# Patient Record
Sex: Female | Born: 1935 | Race: White | Hispanic: No | Smoking: Never smoker
Health system: Southern US, Community
[De-identification: ages and names within clinical notes are randomized; demographics above are authoritative.]

## PROBLEM LIST (undated history)

## (undated) DIAGNOSIS — I10 Essential (primary) hypertension: Secondary | ICD-10-CM

## (undated) DIAGNOSIS — I493 Ventricular premature depolarization: Secondary | ICD-10-CM

## (undated) DIAGNOSIS — E785 Hyperlipidemia, unspecified: Secondary | ICD-10-CM

## (undated) DIAGNOSIS — H9190 Unspecified hearing loss, unspecified ear: Secondary | ICD-10-CM

## (undated) DIAGNOSIS — R002 Palpitations: Secondary | ICD-10-CM

## (undated) DIAGNOSIS — R42 Dizziness and giddiness: Secondary | ICD-10-CM

## (undated) DIAGNOSIS — I351 Nonrheumatic aortic (valve) insufficiency: Secondary | ICD-10-CM

## (undated) DIAGNOSIS — I2510 Atherosclerotic heart disease of native coronary artery without angina pectoris: Secondary | ICD-10-CM

## (undated) DIAGNOSIS — R0602 Shortness of breath: Secondary | ICD-10-CM

## (undated) DIAGNOSIS — M4800 Spinal stenosis, site unspecified: Secondary | ICD-10-CM

## (undated) HISTORY — PX: INGUINAL HERNIA REPAIR: SUR1180

## (undated) HISTORY — PX: ABDOMINAL HYSTERECTOMY: SHX81

## (undated) HISTORY — DX: Atherosclerotic heart disease of native coronary artery without angina pectoris: I25.10

## (undated) HISTORY — DX: Hyperlipidemia, unspecified: E78.5

## (undated) HISTORY — DX: Dizziness and giddiness: R42

## (undated) HISTORY — DX: Ventricular premature depolarization: I49.3

## (undated) HISTORY — DX: Nonrheumatic aortic (valve) insufficiency: I35.1

## (undated) HISTORY — DX: Essential (primary) hypertension: I10

## (undated) HISTORY — PX: TONSILLECTOMY AND ADENOIDECTOMY: SUR1326

## (undated) HISTORY — PX: APPENDECTOMY: SHX54

## (undated) HISTORY — DX: Palpitations: R00.2

## (undated) HISTORY — PX: OVARIAN CYST REMOVAL: SHX89

## (undated) HISTORY — DX: Shortness of breath: R06.02

---

## 1991-07-29 HISTORY — PX: CARDIAC CATHETERIZATION: SHX172

## 1995-07-29 HISTORY — PX: CORONARY ARTERY BYPASS GRAFT: SHX141

## 1996-01-12 HISTORY — PX: CARDIAC CATHETERIZATION: SHX172

## 1998-12-03 ENCOUNTER — Other Ambulatory Visit: Admission: RE | Admit: 1998-12-03 | Discharge: 1998-12-03 | Payer: Self-pay | Admitting: Obstetrics and Gynecology

## 1998-12-26 ENCOUNTER — Ambulatory Visit (HOSPITAL_COMMUNITY): Admission: RE | Admit: 1998-12-26 | Discharge: 1998-12-26 | Payer: Self-pay | Admitting: Geriatric Medicine

## 1999-09-16 ENCOUNTER — Emergency Department (HOSPITAL_COMMUNITY): Admission: EM | Admit: 1999-09-16 | Discharge: 1999-09-16 | Payer: Self-pay

## 1999-12-27 ENCOUNTER — Other Ambulatory Visit: Admission: RE | Admit: 1999-12-27 | Discharge: 1999-12-27 | Payer: Self-pay | Admitting: Obstetrics and Gynecology

## 2000-01-10 ENCOUNTER — Encounter: Admission: RE | Admit: 2000-01-10 | Discharge: 2000-01-10 | Payer: Self-pay | Admitting: Obstetrics and Gynecology

## 2000-01-10 ENCOUNTER — Encounter: Payer: Self-pay | Admitting: Obstetrics and Gynecology

## 2000-04-21 ENCOUNTER — Encounter: Admission: RE | Admit: 2000-04-21 | Discharge: 2000-04-21 | Payer: Self-pay | Admitting: *Deleted

## 2000-04-21 ENCOUNTER — Encounter: Payer: Self-pay | Admitting: Cardiology

## 2000-12-29 ENCOUNTER — Other Ambulatory Visit: Admission: RE | Admit: 2000-12-29 | Discharge: 2000-12-29 | Payer: Self-pay | Admitting: Obstetrics and Gynecology

## 2001-01-12 ENCOUNTER — Encounter: Payer: Self-pay | Admitting: Obstetrics and Gynecology

## 2001-01-12 ENCOUNTER — Encounter: Admission: RE | Admit: 2001-01-12 | Discharge: 2001-01-12 | Payer: Self-pay | Admitting: Obstetrics and Gynecology

## 2002-01-17 ENCOUNTER — Encounter: Admission: RE | Admit: 2002-01-17 | Discharge: 2002-01-17 | Payer: Self-pay | Admitting: Geriatric Medicine

## 2002-01-17 ENCOUNTER — Encounter: Payer: Self-pay | Admitting: Geriatric Medicine

## 2002-06-26 ENCOUNTER — Encounter: Payer: Self-pay | Admitting: Geriatric Medicine

## 2002-06-26 ENCOUNTER — Encounter: Admission: RE | Admit: 2002-06-26 | Discharge: 2002-06-26 | Payer: Self-pay | Admitting: Geriatric Medicine

## 2003-01-24 ENCOUNTER — Encounter: Payer: Self-pay | Admitting: Geriatric Medicine

## 2003-01-24 ENCOUNTER — Encounter: Admission: RE | Admit: 2003-01-24 | Discharge: 2003-01-24 | Payer: Self-pay | Admitting: Geriatric Medicine

## 2004-10-29 ENCOUNTER — Encounter: Admission: RE | Admit: 2004-10-29 | Discharge: 2004-10-29 | Payer: Self-pay | Admitting: Geriatric Medicine

## 2005-01-31 ENCOUNTER — Ambulatory Visit: Payer: Self-pay | Admitting: Physical Medicine & Rehabilitation

## 2005-01-31 ENCOUNTER — Inpatient Hospital Stay (HOSPITAL_COMMUNITY): Admission: AD | Admit: 2005-01-31 | Discharge: 2005-02-06 | Payer: Self-pay | Admitting: *Deleted

## 2005-09-27 ENCOUNTER — Emergency Department (HOSPITAL_COMMUNITY): Admission: EM | Admit: 2005-09-27 | Discharge: 2005-09-27 | Payer: Self-pay | Admitting: Emergency Medicine

## 2005-10-08 ENCOUNTER — Encounter: Admission: RE | Admit: 2005-10-08 | Discharge: 2005-11-19 | Payer: Self-pay | Admitting: Orthopedic Surgery

## 2005-11-20 ENCOUNTER — Encounter: Admission: RE | Admit: 2005-11-20 | Discharge: 2005-12-10 | Payer: Self-pay | Admitting: Orthopedic Surgery

## 2005-12-11 ENCOUNTER — Encounter: Admission: RE | Admit: 2005-12-11 | Discharge: 2006-03-11 | Payer: Self-pay | Admitting: Orthopedic Surgery

## 2005-12-29 ENCOUNTER — Encounter: Admission: RE | Admit: 2005-12-29 | Discharge: 2005-12-29 | Payer: Self-pay | Admitting: Geriatric Medicine

## 2006-12-31 ENCOUNTER — Encounter: Admission: RE | Admit: 2006-12-31 | Discharge: 2006-12-31 | Payer: Self-pay | Admitting: Geriatric Medicine

## 2008-01-03 ENCOUNTER — Encounter: Admission: RE | Admit: 2008-01-03 | Discharge: 2008-01-03 | Payer: Self-pay | Admitting: Cardiology

## 2008-10-09 ENCOUNTER — Encounter: Admission: RE | Admit: 2008-10-09 | Discharge: 2008-11-22 | Payer: Self-pay | Admitting: Orthopedic Surgery

## 2010-12-13 NOTE — Discharge Summary (Signed)
[name], [name]                 ACCOUNT NO.:  [id number]   MEDICAL RECORD NO.:  [id number]          PATIENT TYPE:  INP   LOCATION:  3003                         FACILITY:  [facility]   PHYSICIAN:  Jordan [name], M.D.DATE OF BIRTH:  Jan 02, 1916   DATE OF ADMISSION:  01/31/2005  DATE OF DISCHARGE:  02/06/2005                                 DISCHARGE SUMMARY   ADDENDUM:  Patient discharged today.  She was seen in rounds today.  She is doing fine,  no complaints.  She has been ambulating well with a rolling walker.  Her  discharge BP was 144/55.  Patient's antihypertensive medications were held  on account of low blood pressures.  Decided to resume patient on all her pre  admission medications.  Again, to resume all her pre admission medications  except for Norvasc which will be on hold for now until she sees her PCP.  Also, instead of taking Darvon/HCTZ 150/12.5 half tablet b.i.d. she is going  to take half tablet once daily only for now until she sees her PCP.  She  will be getting Darvocet-N 100 one to two tablets q.4h. p.r.n. for pain.  She is to follow up with her PCP, Dr. Pete [name] next week for evaluation.  She is going to be discharged home with home health PT/OT and aide.  Patient  discharged stable, satisfactory condition.  We had planned to send her to  [facility] but evaluation by rehabilitation medicine showed appropriate for home  discharge with therapy.       GO/MEDQ  D:  02/06/2005  T:  02/06/2005  Job:  [id number]

## 2010-12-13 NOTE — H&P (Signed)
NAME:  Oakey, Dari                 ACCOUNT NO.:  [id number]   MEDICAL RECORD NO.:  [id number]          PATIENT TYPE:  INP   LOCATION:  3003                         FACILITY:  [facility]   PHYSICIAN:  Hal T. Stoneking, M.D. DATE OF BIRTH:  12-02-1915   DATE OF ADMISSION:  01/31/2005  DATE OF DISCHARGE:                                HISTORY & PHYSICAL   IDENTIFYING DATA:  Ms. Scioneaux is a very nice 77 year old white female.  She is  independent in all of her activities of daily living.   PROBLEM LIST:  Syndrome.  Ms. Redwine felt poorly about a week ago with what  she felt was a viral gastroenteritis with a lot of nausea and vomiting.  It  seemed to be a self limited problem.  She then awakened, on the morning of  January 29, 2005, with a very painful neck if she would move her neck from side  to side.  She has known cervical spine disease.  In fact, she had a MRI of  her cervical spine which was done back in November 2003.  It showed  degenerative changes in the mid cervical region at C3-C4, C4-C5, C5-C6 with  spondylosis.  No cord deformity.  She had facet degeneration in that same  region showing osteophytic encroachment on the neuroforamen.  When she was  seen in the office two days ago, she was placed on Skelaxin 800 mg at night.  She took that the evening of January 29, 2005, and then on January 30, 2005, she  said she just was sleepy and slept most of the day, and then today she  awakened and had severe low back pain.  She stated that her neck felt much  better.  She was not aware of any fever, no cough.  She states she moved her  bowels normally yesterday but has not moved them today and noticed her  abdomen to be a little bit more protuberant.  She states the back pain is  quite severe and it moves down into both legs.  It has made it very  difficult for her to get up and down and she has required the help of a  friend to do so.  She does have a history of moderate aortic insufficiency.  This has  been  followed by Dr. Peter [place].  She has really noted no cough, no shortness  of breath, and no ankle edema.   PAST MEDICAL HISTORY:  She has been intolerant to multiple medications  including:  Keflex, Equagesic, Cafergot, tetracycline, codeine, Clinoril,  prednisone, Lodine, Dolobid, erythromycin, Vioxx, and Zetia.  Most of these  were intolerances, I am not aware of any true allergic reaction.   CURRENT MEDICATIONS:  1.  Fosamax 70 mg once a week.  2.  Calcium with vitamin D twice a day.  3.  Crestor 10 mg a day.  4.  Norvasc 5 mg a day.  5.  Diovan 160/12.5, half tablet twice a day.  6.  Protonix 40 mg a day.  7.  Premarin 0.625 mg a day.  8.  Aspirin one a day.  9.  __________  two p.o. b.i.d.  10. Refresh eye drops two drops each eye twice a day.   PAST MEDICAL HISTORY:  1.  Osteoporosis.  2.  Elevated cholesterol.  3.  Coronary artery disease.  4.  Hypertension, diagnosed in 1982.  5.  Presbycusis with bilateral hearing aids.  6.  Aortic insufficiency.  7.  Hiatal hernia seen on upper GI in 1995.   PREVIOUS SURGERIES:  1.  She had a four-vessel CABG.  2.  She has had a total abdominal hysterectomy for abnormal uterine      bleeding.  3.  Ovaries removed in 1991.  4.  She has had an appendectomy.   FAMILY HISTORY:  Mother died in her 29s of an MI.  Son had an MI at the age  of 31 and a CABG, died of a ruptured aneurysm at the age of 62.  Father had  renal disease, died from heart disease.  One sister died, unclear cause.  One sister, age 84, is in a nursing home.  One brother died of an MI and  cirrhosis.  One brother died after a motor vehicle accident.   SOCIAL HISTORY:  Mrs. Achee is divorced.  Lost her son, in 2001, to abdominal  aortic aneurysm.  She is a retired Public librarian.  Does not smoke or  drink.   REVIEW OF SYSTEMS:  No headache, no change in her vision, no trouble  swallowing, no dysuria, otherwise please see HPI.   PHYSICAL EXAMINATION:   VITAL SIGNS:  Temperature 101.3, pulse 96, blood  pressure 124/70, respiratory rate 20.  SKIN:  Normal.  No evidence of rash.  HEENT:  Pupils are equal, round, and reactive to light.  Fundi normal.  TMs  normal.  LUNGS:  Revealed some decreased breath sounds at the bases.  HEART:  Regular rate and rhythm with a 1/6 early diastolic murmur.  ABDOMEN:  Somewhat protuberant.  Bowel sounds present.  Mild tenderness  throughout.  EXTREMITIES:  No lower extremity edema.   ASSESSMENT:  1.  Fever, very possibly could have a urinary tract infection but I doubt      that pyelonephritis is the cause of her back pain.  It seems much more      mechanical.  She could also have a diskitis explaining her fever.  2.  Back pain.  May very well be exacerbation of disk disease with her      recent nausea and vomiting.  However, can not rule out diskitis or even      a ruptured disk.  Also a possibility would be vertebral compression      fracture.  3.  Aortic insufficiency.  She is hemodynamically stable.  No acute change      is noted.  4.  Abdominal distention, mild tenderness, maybe an ileus.  We will get a      KUB to evaluate further.   PLAN:  1.  We will obtain a CBC, C-MET, UA, and blood cultures.  2.  We will obtain a KUB.  3.  We will obtain L, S spine films.  She may need an MRI of her L, S spine      if the above workup is unrevealing.       HTS/MEDQ  D:  01/31/2005  T:  01/31/2005  Job:  [id number]

## 2010-12-13 NOTE — Discharge Summary (Signed)
NAMESWATI, GRANBERRY                 ACCOUNT NO.:  [id number]   MEDICAL RECORD NO.:  [id number]          PATIENT TYPE:  INP   LOCATION:  3003                         FACILITY:  [facility]   PHYSICIAN:  Theone Stanley, MD   DATE OF BIRTH:  Jul 15, 1916   DATE OF ADMISSION:  01/31/2005  DATE OF DISCHARGE:                                 DISCHARGE SUMMARY   ADMISSION DIAGNOSES:  1.  Severe back pain.  2.  Osteoporosis.  3.  Hypertension.  4.  Hyperlipidemia.  5.  Gastroesophageal reflux disease.  6.  History of coronary artery disease.  7.  Aortic insufficiency.  8.  Hiatal hernia.   DISCHARGE DIAGNOSES:  1.  Back pain, most likely musculoskeletal.  2.  Osteoporosis.  3.  Hypertension.  4.  Hyperlipidemia.  5.  Gastroesophageal reflux disease.  6.  History of coronary artery disease.  7.  Aortic insufficiency.  8.  Hiatal hernia.   CONSULTATIONS:  None.   PROCEDURES, DIAGNOSTIC TESTS:  On July 10, the patient had an MRI of the  spine, which showed no acute abnormalities.  There is no fracture or mass.  There is no disk protrusion or significant spinal stenosis.   HOSPITAL COURSE:  Ms. Lortz is a very nice 77 year old female who presented  to Dr. Pete Glatter with severe back pain.  She was given Skelaxin at that  time, which caused her to become very sleepy and slept most of the day, and  she had awakened with severe lower back pain.  At that time the back pain  was quite severe and it moved down both her legs and felt she had difficulty  getting up.  She has a history of moderate aortic insufficiency and is  followed by Dr. [place].  Because of this intractable back pain, the patient  was admitted.  X-rays were performed at that time, which were negative.  However, because of the continued severe back pain, a question of a  compression fracture especially with her history of osteoporosis, an MRI was  performed.  This was negative.  The only other issue that she has had is low-  grade  fever, of which at this point in time there is no obvious source for  this patient.  The patient's back pain slowly improved.  In regard to her hypertension and  her other medical issues, she was continued on her home medications.  Please  see next discharge summary for discharge medications, further workup and  follow-up.       AEJ/MEDQ  D:  02/03/2005  T:  02/04/2005  Job:  [id number]   cc:   Hal T. Stoneking, M.D.  [address] [place], [place] [address]  Fax: [phone]

## 2011-02-03 ENCOUNTER — Encounter: Payer: Self-pay | Admitting: Cardiology

## 2011-02-06 ENCOUNTER — Encounter: Payer: Self-pay | Admitting: Cardiology

## 2011-02-07 ENCOUNTER — Ambulatory Visit
Admission: RE | Admit: 2011-02-07 | Discharge: 2011-02-07 | Disposition: A | Discharge status: Home / Self Care | Payer: Medicare Other | Source: Ambulatory Visit | Attending: Cardiology | Admitting: Cardiology

## 2011-02-07 ENCOUNTER — Ambulatory Visit (INDEPENDENT_AMBULATORY_CARE_PROVIDER_SITE_OTHER): Payer: Medicare Other | Admitting: Cardiology

## 2011-02-07 ENCOUNTER — Encounter: Payer: Self-pay | Admitting: Cardiology

## 2011-02-07 VITALS — BP 160/72 | HR 76 | Ht 60.0 in | Wt 123.0 lb

## 2011-02-07 DIAGNOSIS — R079 Chest pain, unspecified: Secondary | ICD-10-CM

## 2011-02-07 DIAGNOSIS — R0609 Other forms of dyspnea: Secondary | ICD-10-CM

## 2011-02-07 DIAGNOSIS — R0600 Dyspnea, unspecified: Secondary | ICD-10-CM

## 2011-02-07 DIAGNOSIS — R0989 Other specified symptoms and signs involving the circulatory and respiratory systems: Secondary | ICD-10-CM

## 2011-02-07 DIAGNOSIS — I2510 Atherosclerotic heart disease of native coronary artery without angina pectoris: Secondary | ICD-10-CM

## 2011-02-07 DIAGNOSIS — R0602 Shortness of breath: Secondary | ICD-10-CM

## 2011-02-07 DIAGNOSIS — I351 Nonrheumatic aortic (valve) insufficiency: Secondary | ICD-10-CM

## 2011-02-07 DIAGNOSIS — I10 Essential (primary) hypertension: Secondary | ICD-10-CM

## 2011-02-07 DIAGNOSIS — E785 Hyperlipidemia, unspecified: Secondary | ICD-10-CM | POA: Insufficient documentation

## 2011-02-07 LAB — CBC WITH DIFFERENTIAL/PLATELET
Basophils Relative: 0.3 % (ref 0.0–3.0)
Eosinophils Absolute: 0.3 10*3/uL (ref 0.0–0.7)
Eosinophils Relative: 3.1 % (ref 0.0–5.0)
HCT: 41.7 % (ref 36.0–46.0)
MCHC: 34.4 g/dL (ref 30.0–36.0)
MCV: 93 fl (ref 78.0–100.0)
Monocytes Absolute: 0.7 10*3/uL (ref 0.1–1.0)
Monocytes Relative: 7.7 % (ref 3.0–12.0)
Neutro Abs: 5.4 10*3/uL (ref 1.4–7.7)
WBC: 9.6 10*3/uL (ref 4.5–10.5)

## 2011-02-07 LAB — BRAIN NATRIURETIC PEPTIDE: Pro B Natriuretic peptide (BNP): 168 pg/mL — ABNORMAL HIGH (ref 0.0–100.0)

## 2011-02-07 MED ORDER — ISOSORBIDE MONONITRATE ER 60 MG PO TB24: 60.0000 mg | ORAL_TABLET | ORAL | Status: DC

## 2011-02-07 NOTE — Assessment & Plan Note (Signed)
Based on her evaluation today it doesn't appear that she has significant volume overload or congestive heart failure. We will obtain an echocardiogram. I suspect that her symptoms may be anginal equivalent symptoms and I recommended adding isosorbide 60 mg daily to her current regimen. At her age she is not a candidate for aggressive evaluation.

## 2011-02-07 NOTE — Progress Notes (Signed)
Cyndie Chime Date of Birth: [birth year]   History of Present Illness: Mrs. Jordan [name] is seen today for evaluation of shortness of breath. She is now 77 years old. I have not seen her in a couple of years. She reports that over the past 2 weeks she has experienced increasing shortness of breath. This is worse with activity. On 2 occasions she has had chest pain as well for which she has taken nitroglycerin. She does feel that the nitroglycerin helped. She has experienced mild swelling in her left ankle. At first she thought this was related to the cholesterol medicine she was prescribed.This as WelChol which she takes twice a day. She has a known history of coronary disease and is status post CABG in 1997. She also has a history of mild to moderate aortic insufficiency.  Current Outpatient Prescriptions on File Prior to Visit  Medication Sig Dispense Refill  . amLODipine (NORVASC) 5 MG tablet Take 5 mg by mouth daily.        Marland Kitchen aspirin 81 MG tablet Take 81 mg by mouth daily.        . Calcium Carbonate-Vit D-Min (CALTRATE 600+D PLUS) 600-400 MG-UNIT per tablet Take 1 tablet by mouth daily.        . colesevelam (WELCHOL) 625 MG tablet Take 625 mg by mouth 2 (two) times daily with a meal.        . Estradiol (VIVELLE-DOT TD) Place onto the skin daily.        . fish oil-omega-3 fatty acids 1000 MG capsule Take 1 g by mouth daily.        . Multiple Vitamin (MULTIVITAMIN) tablet Take 1 tablet by mouth daily.        . Multiple Vitamins-Minerals (ICAPS PO) Take by mouth daily.        . nitroGLYCERIN (NITROSTAT) 0.4 MG SL tablet Place 0.4 mg under the tongue every 5 (five) minutes as needed.        Marland Kitchen omeprazole (PRILOSEC) 20 MG capsule Take 20 mg by mouth daily.        . valsartan (DIOVAN) 160 MG tablet Take 160 mg by mouth daily.        . vitamin C (ASCORBIC ACID) 500 MG tablet Take 500 mg by mouth daily.          Allergies  Allergen Reactions  . Cafergot   . Clinoril (Sulindac)   . Codeine   .  Dolobid (Diflunisal)   . Equagesic   . Flu Virus Vaccine   . Keflex   . Neosporin (Neomycin-Polymyx-Gramicid)   . Other     BEE STING  . Prednisone   . Tetracyclines & Related     Past Medical History  Diagnosis Date  . Chest pain   . SOB (shortness of breath)   . Coronary artery disease   . Hyperlipidemia   . Hypertension   . Aortic insufficiency     MILD TO MODERATE  . PVC's (premature ventricular contractions)   . Dizziness   . Palpitations     Past Surgical History  Procedure Date  . Cardiac catheterization 01/12/1996    THE EF IS DIFFICULT TO ESTIMATE DUE TO PVCS. SEVERE TWO-VESSEL OBSTRUCTIVE ATHERSCLEROTIC CAD WITH COMPLEX STENOSIS IN THE LAD AND DIAGONAL VESSELS  . Cardiac catheterization 07/29/1991    EF 80-85%  . Coronary artery bypass graft 1997  . Tonsillectomy and adenoidectomy   . Appendectomy   . Ovarian cyst removal   . Inguinal hernia repair  History  Smoking status  . Never Smoker   Smokeless tobacco  . Never Used    History  Alcohol Use No    Family History  Problem Relation Age of Onset  . Heart disease Mother   . Heart disease Father   . Nephritis Brother     Review of Systems: The review of systems is positive for mild swelling in her left ankle. She has chronic hearing loss. She does report increased frequency of urination. All other systems were reviewed and are negative.  Physical Exam: BP 160/72  Pulse 76  Ht 5' (1.524 m)  Wt 123 lb (55.792 kg)  BMI 24.02 kg/m2 She is an elderly white female in no acute distress. She is normocephalic, atraumatic. Pupils are round and reactive to light and accommodation. Extraocular movements are full. Oropharynx is clear. Neck is supple without JVD, adenopathy, or bruits. Lungs are clear. Cardiac exam reveals a regular rate and rhythm with a soft grade 1/6 diastolic murmur left sternal border. She has a soft systolic ejection murmur in the left lower sternal border. Abdomen is soft and  nontender without mass or bruits. She has trace left ankle edema. Pedal pulses are good. She is alert and oriented x3. Cranial nerves II through XII are intact.  LABORATORY DATA: ECG today demonstrates normal sinus rhythm with poor R-wave progression and relatively low voltage. Chemistry panel from June was normal. CBC today was normal. BNP was 168. Chest x-ray shows no active disease.   Assessment / Plan:

## 2011-02-07 NOTE — Assessment & Plan Note (Signed)
We will add isosorbide to her current therapy with amlodipine. We will await the followup of her echocardiogram.

## 2011-02-07 NOTE — Patient Instructions (Signed)
We will get a chest Xray on you today.  We will schedule you for an Echocardiogram.  I will start you on isosorbide 60 mg daily.  We will check your blood counts today ( lab )  I will see you again in 2 weeks.

## 2011-02-11 ENCOUNTER — Telehealth: Payer: Self-pay | Admitting: *Deleted

## 2011-02-11 NOTE — Telephone Encounter (Signed)
Notified of lab and chest xray results. Will send copy to Dr. Pete Glatter.

## 2011-02-11 NOTE — Telephone Encounter (Signed)
Message copied by Lorayne Bender on Tue Feb 11, 2011  8:45 AM ------      Message from: [place], PETER M      Created: Fri Feb 07, 2011  4:16 PM       CBC is normal, BNP is ok. Please report.

## 2011-02-13 ENCOUNTER — Ambulatory Visit (HOSPITAL_COMMUNITY): Payer: Medicare Other | Attending: Cardiology

## 2011-02-13 DIAGNOSIS — I079 Rheumatic tricuspid valve disease, unspecified: Secondary | ICD-10-CM | POA: Insufficient documentation

## 2011-02-13 DIAGNOSIS — E785 Hyperlipidemia, unspecified: Secondary | ICD-10-CM | POA: Insufficient documentation

## 2011-02-13 DIAGNOSIS — R0989 Other specified symptoms and signs involving the circulatory and respiratory systems: Secondary | ICD-10-CM | POA: Insufficient documentation

## 2011-02-13 DIAGNOSIS — I379 Nonrheumatic pulmonary valve disorder, unspecified: Secondary | ICD-10-CM | POA: Insufficient documentation

## 2011-02-13 DIAGNOSIS — R0600 Dyspnea, unspecified: Secondary | ICD-10-CM

## 2011-02-13 DIAGNOSIS — I10 Essential (primary) hypertension: Secondary | ICD-10-CM | POA: Insufficient documentation

## 2011-02-13 DIAGNOSIS — I2510 Atherosclerotic heart disease of native coronary artery without angina pectoris: Secondary | ICD-10-CM | POA: Insufficient documentation

## 2011-02-13 DIAGNOSIS — R0609 Other forms of dyspnea: Secondary | ICD-10-CM | POA: Insufficient documentation

## 2011-02-13 DIAGNOSIS — I080 Rheumatic disorders of both mitral and aortic valves: Secondary | ICD-10-CM | POA: Insufficient documentation

## 2011-02-13 DIAGNOSIS — R072 Precordial pain: Secondary | ICD-10-CM

## 2011-02-14 ENCOUNTER — Telehealth: Payer: Self-pay | Admitting: *Deleted

## 2011-02-14 NOTE — Telephone Encounter (Signed)
Notified of echo results.will see in 2 wks

## 2011-02-14 NOTE — Telephone Encounter (Signed)
Message copied by Lorayne Bender on Fri Feb 14, 2011  4:02 PM ------      Message from: [place], PETER M      Created: Fri Feb 14, 2011  9:26 AM       Echo looks very good. LV function is normal. Only mild MR, AI. Please report.

## 2011-02-21 ENCOUNTER — Encounter: Payer: Self-pay | Admitting: Cardiology

## 2011-02-21 ENCOUNTER — Ambulatory Visit (INDEPENDENT_AMBULATORY_CARE_PROVIDER_SITE_OTHER): Payer: Medicare Other | Admitting: Cardiology

## 2011-02-21 DIAGNOSIS — I351 Nonrheumatic aortic (valve) insufficiency: Secondary | ICD-10-CM

## 2011-02-21 DIAGNOSIS — I359 Nonrheumatic aortic valve disorder, unspecified: Secondary | ICD-10-CM

## 2011-02-21 DIAGNOSIS — R0602 Shortness of breath: Secondary | ICD-10-CM

## 2011-02-21 NOTE — Assessment & Plan Note (Signed)
Her evaluation to date including blood work, chest x-ray, and echocardiogram have been unrevealing. There is no evidence of congestive heart failure. These may be anginal equivalent symptoms and the isosorbide seems to help a little bit. Since she didn't sit well call us causing some of her symptoms we'll give her a trial off of medication for 2 weeks. I don't think she is really a candidate for more aggressive treatment of coronary disease so we will continue her on medical therapy. I will followup again in 3 months.

## 2011-02-21 NOTE — Progress Notes (Signed)
Jordan [name] Date of Birth: 09-Jul-1916   History of Present Illness: Jordan [name] is seen today for followup. She reports that her breathing has improved but she still gets short of breath with exertion. She thinks this may be related to her WelChol. She is tolerating her isosorbide well. She has had no significant chest pain.  Current Outpatient Prescriptions on File Prior to Visit  Medication Sig Dispense Refill  . amLODipine (NORVASC) 5 MG tablet Take 5 mg by mouth daily.        Marland Kitchen aspirin 81 MG tablet Take 81 mg by mouth daily.        . Calcium Carbonate-Vit D-Min (CALTRATE 600+D PLUS) 600-400 MG-UNIT per tablet Take 1 tablet by mouth daily.        . colesevelam (WELCHOL) 625 MG tablet Take 625 mg by mouth 2 (two) times daily with a meal.        . Estradiol (VIVELLE-DOT TD) Place onto the skin daily.        . fish oil-omega-3 fatty acids 1000 MG capsule Take 1 g by mouth daily.        . isosorbide mononitrate (IMDUR) 60 MG 24 hr tablet Take 1 tablet (60 mg total) by mouth every morning.  30 tablet  11  . Multiple Vitamin (MULTIVITAMIN) tablet Take 1 tablet by mouth daily.        . Multiple Vitamins-Minerals (ICAPS PO) Take by mouth daily.        . nitroGLYCERIN (NITROSTAT) 0.4 MG SL tablet Place 0.4 mg under the tongue every 5 (five) minutes as needed.        Marland Kitchen omeprazole (PRILOSEC) 20 MG capsule Take 20 mg by mouth daily.        . vitamin C (ASCORBIC ACID) 500 MG tablet Take 500 mg by mouth daily.          Allergies  Allergen Reactions  . Cafergot   . Clinoril (Sulindac)   . Codeine   . Dolobid (Diflunisal)   . Equagesic   . Flu Virus Vaccine   . Keflex   . Neosporin (Neomycin-Polymyx-Gramicid)   . Other     BEE STING  . Prednisone   . Tetracyclines & Related     Past Medical History  Diagnosis Date  . Chest pain   . SOB (shortness of breath)   . Coronary artery disease   . Hyperlipidemia   . Hypertension   . Aortic insufficiency     MILD TO MODERATE  . PVC's  (premature ventricular contractions)   . Dizziness   . Palpitations     Past Surgical History  Procedure Date  . Cardiac catheterization 01/12/1996    THE EF IS DIFFICULT TO ESTIMATE DUE TO PVCS. SEVERE TWO-VESSEL OBSTRUCTIVE ATHERSCLEROTIC CAD WITH COMPLEX STENOSIS IN THE LAD AND DIAGONAL VESSELS  . Cardiac catheterization 07/29/1991    EF 80-85%  . Coronary artery bypass graft 1997  . Tonsillectomy and adenoidectomy   . Appendectomy   . Ovarian cyst removal   . Inguinal hernia repair     History  Smoking status  . Never Smoker   Smokeless tobacco  . Never Used    History  Alcohol Use No    Family History  Problem Relation Age of Onset  . Heart disease Mother   . Heart disease Father   . Nephritis Brother     Review of Systems: The review of systems is positive for mild swelling in her left ankle. She  has chronic hearing loss. She does report increased frequency of urination. All other systems were reviewed and are negative.  Physical Exam: BP 136/70  Pulse 66  Ht 5' (1.524 m)  Wt 123 lb 6.4 oz (55.974 kg)  BMI 24.10 kg/m2 She is an elderly white female in no acute distress. She is normocephalic, atraumatic. Pupils are round and reactive to light and accommodation. Extraocular movements are full. Oropharynx is clear. Neck is supple without JVD, adenopathy, or bruits. Lungs are clear. Cardiac exam reveals a regular rate and rhythm with a soft grade 1/6 diastolic murmur left sternal border. She has a soft systolic ejection murmur in the left lower sternal border. Abdomen is soft and nontender without mass or bruits. She has trace left ankle edema. Pedal pulses are good. She is alert and oriented x3. Cranial nerves II through XII are intact.  LABORATORY DATA: Her echocardiogram showed mild LVH with normal systolic function. She had mild aortic and mitral insufficiency.   Assessment / Plan:

## 2011-02-21 NOTE — Patient Instructions (Signed)
Try stopping Welchol for 2 weeks. If your breathing improves then I would stay off it. If your breathing is no better then resume it again.  Continue your other medications.  I will see you back again in 3 months.

## 2011-03-10 ENCOUNTER — Telehealth: Payer: Self-pay | Admitting: Cardiology

## 2011-03-10 NOTE — Telephone Encounter (Signed)
Called wanting to know if needed premed prior to dental app. According to chart she does not. Has not had valve replacement. Lm regarding this

## 2011-03-10 NOTE — Telephone Encounter (Signed)
Pt wants to know does she need premed for dentist She has appt tomorrow please call and let her know

## 2011-05-20 ENCOUNTER — Ambulatory Visit (INDEPENDENT_AMBULATORY_CARE_PROVIDER_SITE_OTHER): Payer: Medicare Other | Admitting: Cardiology

## 2011-05-20 ENCOUNTER — Encounter: Payer: Self-pay | Admitting: Cardiology

## 2011-05-20 VITALS — BP 156/68 | HR 66 | Ht 65.0 in | Wt 124.8 lb

## 2011-05-20 DIAGNOSIS — I351 Nonrheumatic aortic (valve) insufficiency: Secondary | ICD-10-CM

## 2011-05-20 DIAGNOSIS — I10 Essential (primary) hypertension: Secondary | ICD-10-CM

## 2011-05-20 DIAGNOSIS — E7800 Pure hypercholesterolemia, unspecified: Secondary | ICD-10-CM

## 2011-05-20 DIAGNOSIS — I359 Nonrheumatic aortic valve disorder, unspecified: Secondary | ICD-10-CM

## 2011-05-20 DIAGNOSIS — R0602 Shortness of breath: Secondary | ICD-10-CM

## 2011-05-20 DIAGNOSIS — E785 Hyperlipidemia, unspecified: Secondary | ICD-10-CM

## 2011-05-20 DIAGNOSIS — I2510 Atherosclerotic heart disease of native coronary artery without angina pectoris: Secondary | ICD-10-CM

## 2011-05-20 NOTE — Assessment & Plan Note (Signed)
She is not have any active anginal symptoms on her current regimen. I will plan a followup again in 6 months.

## 2011-05-20 NOTE — Assessment & Plan Note (Signed)
She has no overt signs of congestive heart failure. Left ventricular function is normal. She has responded to nitrate therapy. We will continue on her current treatment. She does have mild ankle edema on the left but this is really fairly minor.

## 2011-05-20 NOTE — Patient Instructions (Addendum)
Try taking Welchol one tablet in the morning and one at noon.  Continue your other medications.  Don't eat salt.  I will see you again in 6 months.

## 2011-05-20 NOTE — Assessment & Plan Note (Signed)
I have recommended that she try taking one WelChol twice daily at her morning and noontime meals. I explained that this does not have any relation to her urination.

## 2011-05-20 NOTE — Progress Notes (Signed)
Jordan [name] Date of Birth: [birth year]   History of Present Illness: Jordan [name] is seen today for followup. She has been doing fairly well. She denies any shortness of breath or chest pain. Her blood pressure fluctuates. She complains about taking WelChol that this keeps her up at night having to urinate. She has cut back to one tablet a day.  Current Outpatient Prescriptions on File Prior to Visit  Medication Sig Dispense Refill  . amLODipine (NORVASC) 5 MG tablet Take 5 mg by mouth daily.        Marland Kitchen aspirin 81 MG tablet Take 81 mg by mouth daily.        . Calcium Carbonate-Vit D-Min (CALTRATE 600+D PLUS) 600-400 MG-UNIT per tablet Take 1 tablet by mouth daily.        . colesevelam (WELCHOL) 625 MG tablet Take 625 mg by mouth 2 (two) times daily with a meal.        . Estradiol (VIVELLE-DOT TD) Place onto the skin daily.        . fish oil-omega-3 fatty acids 1000 MG capsule Take 1 g by mouth daily.        . isosorbide mononitrate (IMDUR) 60 MG 24 hr tablet Take 1 tablet (60 mg total) by mouth every morning.  30 tablet  11  . Multiple Vitamin (MULTIVITAMIN) tablet Take 1 tablet by mouth daily.        . Multiple Vitamins-Minerals (ICAPS PO) Take by mouth daily.        . nitroGLYCERIN (NITROSTAT) 0.4 MG SL tablet Place 0.4 mg under the tongue every 5 (five) minutes as needed.        Marland Kitchen omeprazole (PRILOSEC) 20 MG capsule Take 20 mg by mouth daily.        . valsartan-hydrochlorothiazide (DIOVAN-HCT) 160-12.5 MG per tablet Take 1 tablet by mouth daily. 1/2 daily       . vitamin C (ASCORBIC ACID) 500 MG tablet Take 500 mg by mouth daily.          Allergies  Allergen Reactions  . Cafergot   . Clinoril (Sulindac)   . Codeine   . Dolobid (Diflunisal)   . Equagesic   . Flu Virus Vaccine   . Keflex   . Neosporin (Neomycin-Polymyx-Gramicid)   . Other     BEE STING  . Pce (Erythromycin Base)   . Prednisone   . Tetracyclines & Related     Past Medical History  Diagnosis Date  . Chest  pain   . SOB (shortness of breath)   . Coronary artery disease   . Hyperlipidemia   . Hypertension   . Aortic insufficiency     MILD TO MODERATE  . PVC's (premature ventricular contractions)   . Dizziness   . Palpitations     Past Surgical History  Procedure Date  . Cardiac catheterization 01/12/1996    THE EF IS DIFFICULT TO ESTIMATE DUE TO PVCS. SEVERE TWO-VESSEL OBSTRUCTIVE ATHERSCLEROTIC CAD WITH COMPLEX STENOSIS IN THE LAD AND DIAGONAL VESSELS  . Cardiac catheterization 07/29/1991    EF 80-85%  . Coronary artery bypass graft 1997  . Tonsillectomy and adenoidectomy   . Appendectomy   . Ovarian cyst removal   . Inguinal hernia repair     History  Smoking status  . Never Smoker   Smokeless tobacco  . Never Used    History  Alcohol Use No    Family History  Problem Relation Age of Onset  . Heart disease Mother   .  Heart disease Father   . Nephritis Brother     Review of Systems: The review of systems is positive for mild swelling in her left ankle. She has chronic hearing loss. She does report increased frequency of urination. All other systems were reviewed and are negative.  Physical Exam: BP 156/68  Pulse 66  Ht 5\' 5"  (1.651 m)  Wt 124 lb 12.8 oz (56.609 kg)  BMI 20.77 kg/m2 She is an elderly white female in no acute distress. She is normocephalic, atraumatic. Pupils are round and reactive to light and accommodation. Extraocular movements are full. Oropharynx is clear. Neck is supple without JVD, adenopathy, or bruits. Lungs are clear. Cardiac exam reveals a regular rate and rhythm with a soft grade 1/6 diastolic murmur left sternal border. She has a soft systolic ejection murmur in the left lower sternal border. Abdomen is soft and nontender without mass or bruits. She has trace left ankle edema. Pedal pulses are good. She is alert and oriented x3. Cranial nerves II through XII are intact.  LABORATORY DATA:    Assessment / Plan:

## 2011-09-15 DIAGNOSIS — R05 Cough: Secondary | ICD-10-CM | POA: Diagnosis not present

## 2011-10-23 DIAGNOSIS — H04129 Dry eye syndrome of unspecified lacrimal gland: Secondary | ICD-10-CM | POA: Diagnosis not present

## 2011-10-23 DIAGNOSIS — H353190 Nonexudative age-related macular degeneration, unspecified eye, stage unspecified: Secondary | ICD-10-CM | POA: Diagnosis not present

## 2011-10-23 DIAGNOSIS — H43399 Other vitreous opacities, unspecified eye: Secondary | ICD-10-CM | POA: Diagnosis not present

## 2011-10-23 DIAGNOSIS — H40019 Open angle with borderline findings, low risk, unspecified eye: Secondary | ICD-10-CM | POA: Diagnosis not present

## 2011-10-23 DIAGNOSIS — Z961 Presence of intraocular lens: Secondary | ICD-10-CM | POA: Diagnosis not present

## 2011-10-29 DIAGNOSIS — Z79899 Other long term (current) drug therapy: Secondary | ICD-10-CM | POA: Diagnosis not present

## 2011-10-29 DIAGNOSIS — I10 Essential (primary) hypertension: Secondary | ICD-10-CM | POA: Diagnosis not present

## 2011-10-29 DIAGNOSIS — R05 Cough: Secondary | ICD-10-CM | POA: Diagnosis not present

## 2011-10-29 DIAGNOSIS — G5600 Carpal tunnel syndrome, unspecified upper limb: Secondary | ICD-10-CM | POA: Diagnosis not present

## 2011-11-04 DIAGNOSIS — L723 Sebaceous cyst: Secondary | ICD-10-CM | POA: Diagnosis not present

## 2011-11-11 DIAGNOSIS — L0291 Cutaneous abscess, unspecified: Secondary | ICD-10-CM | POA: Diagnosis not present

## 2011-11-11 DIAGNOSIS — L0390 Cellulitis, unspecified: Secondary | ICD-10-CM | POA: Diagnosis not present

## 2012-01-08 ENCOUNTER — Other Ambulatory Visit: Payer: Self-pay

## 2012-01-21 ENCOUNTER — Emergency Department (HOSPITAL_COMMUNITY): Payer: Medicare Other

## 2012-01-21 ENCOUNTER — Emergency Department (HOSPITAL_COMMUNITY)
Admission: EM | Admit: 2012-01-21 | Discharge: 2012-01-21 | Disposition: A | Discharge status: Home / Self Care | Payer: Medicare Other | Attending: Emergency Medicine | Admitting: Emergency Medicine

## 2012-01-21 ENCOUNTER — Encounter (HOSPITAL_COMMUNITY): Payer: Self-pay | Admitting: Emergency Medicine

## 2012-01-21 DIAGNOSIS — M5030 Other cervical disc degeneration, unspecified cervical region: Secondary | ICD-10-CM | POA: Diagnosis not present

## 2012-01-21 DIAGNOSIS — S0990XA Unspecified injury of head, initial encounter: Secondary | ICD-10-CM | POA: Insufficient documentation

## 2012-01-21 DIAGNOSIS — I10 Essential (primary) hypertension: Secondary | ICD-10-CM | POA: Diagnosis not present

## 2012-01-21 DIAGNOSIS — R51 Headache: Secondary | ICD-10-CM | POA: Insufficient documentation

## 2012-01-21 DIAGNOSIS — S0180XA Unspecified open wound of other part of head, initial encounter: Secondary | ICD-10-CM | POA: Diagnosis not present

## 2012-01-21 DIAGNOSIS — T1490XA Injury, unspecified, initial encounter: Secondary | ICD-10-CM | POA: Diagnosis not present

## 2012-01-21 DIAGNOSIS — M546 Pain in thoracic spine: Secondary | ICD-10-CM | POA: Diagnosis not present

## 2012-01-21 DIAGNOSIS — M545 Low back pain, unspecified: Secondary | ICD-10-CM | POA: Insufficient documentation

## 2012-01-21 DIAGNOSIS — W19XXXA Unspecified fall, initial encounter: Secondary | ICD-10-CM

## 2012-01-21 DIAGNOSIS — I25810 Atherosclerosis of coronary artery bypass graft(s) without angina pectoris: Secondary | ICD-10-CM | POA: Diagnosis not present

## 2012-01-21 DIAGNOSIS — S0190XA Unspecified open wound of unspecified part of head, initial encounter: Secondary | ICD-10-CM | POA: Diagnosis not present

## 2012-01-21 DIAGNOSIS — M542 Cervicalgia: Secondary | ICD-10-CM | POA: Diagnosis not present

## 2012-01-21 DIAGNOSIS — T148XXA Other injury of unspecified body region, initial encounter: Secondary | ICD-10-CM | POA: Diagnosis not present

## 2012-01-21 DIAGNOSIS — W1809XA Striking against other object with subsequent fall, initial encounter: Secondary | ICD-10-CM | POA: Insufficient documentation

## 2012-01-21 DIAGNOSIS — M549 Dorsalgia, unspecified: Secondary | ICD-10-CM | POA: Insufficient documentation

## 2012-01-21 DIAGNOSIS — S0181XA Laceration without foreign body of other part of head, initial encounter: Secondary | ICD-10-CM

## 2012-01-21 MED ORDER — TETANUS-DIPHTH-ACELL PERTUSSIS 5-2.5-18.5 LF-MCG/0.5 IM SUSP
0.5000 mL | Freq: Once | INTRAMUSCULAR | Status: AC
  Administered 2012-01-21: 0.5 mL via INTRAMUSCULAR
  Filled 2012-01-21: qty 0.5

## 2012-01-21 NOTE — Discharge Instructions (Signed)
Keep wound and clean with mild soap and water and covered with a topical antibiotic ointment and bandage. Ice for additional pain relief. Alternate between Ibuprofen and Tylenol for additional pain relief. Follow up with your primary care provider or the [facility] in approximately 7 days for wound recheck and suture removal. Monitor for signs of infection to include but not limited to increasing pain, redness, drainage, or swelling. Return to emergency department for emergent changing or worsening symptoms.   Head Injury, Adult A head injury happens when the head is hit really hard. A head injury may cause sleepiness, headache, throwing up (vomiting), and problems seeing. If the head injury is really bad, you may need to stay in the hospital. HOME CARE  Have someone with you for the first 24 hours. This person should wake you up every 1 hour to check on your condition.   Only drink water or clear fluid for the rest of the day. Then, go back to your regular diet.   Only take medicines as told by your doctor. Do not take aspirin.   Do not drink alcohol for 2 days.   Do not take medicines that help your relax (sedatives) for 2 days.  Side effects may happen for up to 7 to 10 days. Watch for new problems. GET HELP RIGHT AWAY IF:   You are confused or sleepy.   You cannot be woken up.   You feel sick to your stomach (nauseous) or keep throwing up.   Your dizziness or unsteadiness is get worse, or your cannot walk.   You start to shake (convulse) or pass out (faint).   You have very bad, lasting headaches that are not helped by medicine.   You cannot use your arms or legs like normal.   You have clear or bloody fluid coming from your nose or ears.  MAKE SURE YOU:   Understand these instructions.   Will watch your condition.   Will get help right away if you are not doing well or get worse.  Document Released: 06/26/2008 Document Revised: 07/03/2011 Document  Reviewed: 05/30/2009 [facility] Patient Information 2012 [place], [place].

## 2012-01-21 NOTE — ED Notes (Signed)
Off floor for testing 

## 2012-01-21 NOTE — ED Notes (Signed)
Per EMS, pt tripped in bedroom-no loss of consciousness-small laceration above left eye, bleeding controlled-pt c/o head/neck pain

## 2012-01-21 NOTE — ED Provider Notes (Signed)
History     CSN: [id number]  Arrival date & time 01/21/12  1645   First MD Initiated Contact with Patient 01/21/12 1657      Chief Complaint  Patient presents with  . Fall    (Consider location/radiation/quality/duration/timing/severity/associated sxs/prior treatment) HPI  Patient presents to emergency department by EMS on long spine board and in c-collar for fall just prior to arrival and head injury. Patient states she was in her closet bending over to put on shoes when she lost her balance and fell forward striking her head on the shelving in the closet. Patient denies loss of consciousness. Patient states that she attempted to apply pressure to stop the bleeding however states she became very frightened at the amount of blood and called EMS. She denies neck pain but states she had some mild shoulder pain and therefore EMS put her in neck collar and on long spine board. Patient states the shoulder pain has stopped by the time of arrival to the ER. Patient states she takes daily aspirin but has no other anticoagulant use. Patient has a friend at bedside. She is unaware of her last tetanus shot. She denies headache, nausea, vomiting, dizziness, visual changes. Patient has a laceration above her left eye. EMS control bleeding with pressure.  Past Medical History  Diagnosis Date  . Chest pain   . SOB (shortness of breath)   . Coronary artery disease   . Hyperlipidemia   . Hypertension   . Aortic insufficiency     MILD TO MODERATE  . PVC's (premature ventricular contractions)   . Dizziness   . Palpitations     Past Surgical History  Procedure Date  . Cardiac catheterization 01/12/1996    THE EF IS DIFFICULT TO ESTIMATE DUE TO PVCS. SEVERE TWO-VESSEL OBSTRUCTIVE ATHERSCLEROTIC CAD WITH COMPLEX STENOSIS IN THE LAD AND DIAGONAL VESSELS  . Cardiac catheterization 07/29/1991    EF 80-85%  . Coronary artery bypass graft 1997  . Tonsillectomy and adenoidectomy   . Appendectomy   .  Ovarian cyst removal   . Inguinal hernia repair     Family History  Problem Relation Age of Onset  . Heart disease Mother   . Heart disease Father   . Nephritis Brother     History  Substance Use Topics  . Smoking status: Never Smoker   . Smokeless tobacco: Never Used  . Alcohol Use: No    OB History    Grav Para Term Preterm Abortions TAB SAB Ect Mult Living                  Review of Systems  All other systems reviewed and are negative.    Allergies  Cafergot; Cephalexin; Clinoril; Codeine; Dolobid; Equagesic; Flu virus vaccine; Neosporin; Other; Pce; Prednisone; and Tetracyclines & related  Home Medications   Current Outpatient Rx  Name Route Sig Dispense Refill  . AMLODIPINE BESYLATE 5 MG PO TABS Oral Take 5 mg by mouth daily.      . ASPIRIN 81 MG PO TABS Oral Take 81 mg by mouth daily.      Marland Kitchen CALTRATE 600+D PLUS 600-400 MG-UNIT PO TABS Oral Take 1 tablet by mouth daily.      . COLESEVELAM HCL 625 MG PO TABS Oral Take 625 mg by mouth 2 (two) times daily with a meal.      . ESTRADIOL 0.1 MG/24HR TD PTTW Transdermal Place 1 patch onto the skin See admin instructions. Applies patch every 2 1/2 days    .  OMEGA-3 FATTY ACIDS 1000 MG PO CAPS Oral Take 2 g by mouth daily.     . ISOSORBIDE MONONITRATE ER 60 MG PO TB24 Oral Take 1 tablet (60 mg total) by mouth every morning. 30 tablet 11  . ADULT MULTIVITAMIN W/MINERALS CH Oral Take 1 tablet by mouth daily.    . OCUVITE-LUTEIN PO CAPS Oral Take 1 capsule by mouth 2 (two) times daily.    Marland Kitchen NITROGLYCERIN 0.4 MG SL SUBL Sublingual Place 0.4 mg under the tongue every 5 (five) minutes as needed. For chest pain    . OMEPRAZOLE 20 MG PO CPDR Oral Take 20 mg by mouth daily.     Marland Kitchen VALSARTAN-HYDROCHLOROTHIAZIDE 160-12.5 MG PO TABS Oral Take 0.5 tablets by mouth daily.     Marland Kitchen VITAMIN C 500 MG PO TABS Oral Take 500 mg by mouth daily.       BP 173/68  Pulse 70  Temp 98.7 F (37.1 C) (Oral)  Resp 18  SpO2 92%  Physical Exam    Nursing note and vitals reviewed. Constitutional: She is oriented to person, place, and time. She appears well-developed and well-nourished. No distress.  HENT:  Head: Normocephalic.       1.5 linear vertical laceration of left forehead. Hemostatic. No underlying step off.   Good dentition. Nares clear. No septal injury.   Eyes: Conjunctivae and EOM are normal. Pupils are equal, round, and reactive to light.  Neck: Normal range of motion. Neck supple.       c collar cleared after CT cpine with FROM of neck with little to no pain. No midline c spine TTP.   Cardiovascular: Normal rate, regular rhythm, normal heart sounds and intact distal pulses.  Exam reveals no gallop and no friction rub.   No murmur heard. Pulmonary/Chest: Effort normal and breath sounds normal. No respiratory distress. She has no wheezes. She has no rales. She exhibits no tenderness.  Abdominal: Soft. Bowel sounds are normal. She exhibits no distension and no mass. There is no tenderness. There is no rebound and no guarding.  Musculoskeletal: Normal range of motion. She exhibits tenderness. She exhibits no edema.       Mild TTP of paraspinal thoracic and lumbar regions.   FROM of bilateral UE and LE without pain or difficulty. Pelvis stable and non tender.   Neurological: She is alert and oriented to person, place, and time. No cranial nerve deficit. Coordination normal.  Skin: Skin is warm and dry. No rash noted. She is not diaphoretic. No erythema.  Psychiatric: She has a normal mood and affect.    ED Course  Procedures (including critical care time)  LACERATION REPAIR Performed by: Drucie Opitz Authorized by: Drucie Opitz Consent: Verbal consent obtained. Risks and benefits: risks, benefits and alternatives were discussed Consent given by: patient Patient identity confirmed: provided demographic data Prepped and Draped in normal sterile fashion Wound explored  Laceration Location: left  forehead  Laceration Length: 1.5cm  No Foreign Bodies seen or palpated  Anesthesia: local infiltration  Local anesthetic: lidocaine 2% with epinephrine  Anesthetic total: 5 ml  Irrigation method: syringe Amount of cleaning: standard  Skin closure: 5.0 prolene  Number of sutures: 4  Technique: simple interrupted  Patient tolerance: Patient tolerated the procedure well with no immediate complications.  Patient is ambulating about the department without difficulty.  Labs Reviewed - No data to display Dg Thoracic Spine 2 View  01/21/2012  *RADIOLOGY REPORT*  Clinical Data: 78 year old female status post fall with pain.  THORACIC SPINE - 2 VIEW  Comparison: 09/15/2011 and earlier.  Findings: Normal thoracic segmentation.  Sequelae median sternotomy, fractured lower sternotomy wire as before.  Thoracic vertebral height and alignment appears stable. Cervicothoracic junction alignment is within normal limits.  Widespread calcified atherosclerosis.  IMPRESSION: No acute fracture or listhesis identified in the thoracic spine.  Original Report Authenticated By: Harley Hallmark, M.D.   Dg Lumbar Spine Complete  01/21/2012  *RADIOLOGY REPORT*  Clinical Data: 78 year old female with fall.  Pain.  LUMBAR SPINE - COMPLETE 4+ VIEW  Comparison: Lumbar MRI 09/18/2008.  Findings: Normal lumbar segmentation.  Stable vertebral height and alignment since 2010, trace anterolisthesis of L4-L5.  No pars fracture.  Mild for age L5-L1 facet hypertrophy.  Advanced aortic and iliac artery atherosclerosis.  Grossly intact visualized sacral ala.  IMPRESSION: No acute fracture or listhesis identified in the lumbar spine.  Original Report Authenticated By: Harley Hallmark, M.D.   Ct Head Wo Contrast  01/21/2012  *RADIOLOGY REPORT*  Clinical Data:  78 year old female status post fall with laceration and pain.  CT HEAD WITHOUT CONTRAST CT CERVICAL SPINE WITHOUT CONTRAST  Technique:  Multidetector CT imaging of the head  and cervical spine was performed following the standard protocol without intravenous contrast.  Multiplanar CT image reconstructions of the cervical spine were also generated.  Comparison:  Head CT 09/27/2005.  CT HEAD  Findings: Paranasal sinuses are clear except for minimal mucosal thickening in the left maxillary.  Mastoids and tympanic cavities are clear.  No focal scalp hematoma.  Postoperative changes to the globes.  Calvarium intact.  Calcified atherosclerosis at the skull base.  No acute intracranial hemorrhage identified.  No ventriculomegaly.  No intracranial mass lesions; effacement of the ventral cisterna magna is related to arthropathic changes at the odontoid, see below.  Patchy cerebral white matter and deep gray matter hypodensity. No evidence of cortically based acute infarction identified.  No suspicious intracranial vascular hyperdensity.  IMPRESSION: 1.  No acute traumatic injury to the brain. 2.  See cervical spine findings below.  CT CERVICAL SPINE  Findings: Large soft tissue pannus along the dorsal odontoid and posterior C2 vertebral body with erosive changes.  Faint calcifications within the pannus.  No odontoid or C2 fracture is identified.  C1-C2 alignment remains within normal limits, there is joint space loss especially at the left C1-C2 lateral mass articulation.  Mild erosive changes at the right occipital condyle. The skull base otherwise appears intact.  2 mm of anterolisthesis of C3 on C4.  The left C3-C4 posterior elements appear ankylosed.  Diffuse cervical disc space loss elsewhere.  2-3 mm of anterolisthesis of C7 on T1 with associated chronic facet hypertrophy.  Advanced T1-T2 disc space loss also. no acute cervical fracture is identified.  Visualized upper thoracic levels appear grossly intact.  Scarring and some calcified pleural plaques at the lung apices. Calcified atherosclerosis.  Sub centimeter hypodense and partially calcified thyroid nodules.  Paraspinal soft tissues  otherwise are within normal limits.  IMPRESSION: 1. No acute fracture or listhesis identified in the cervical spine. Ligamentous injury is not excluded. 2.  Erosive arthropathy with large soft tissue pannus from the skull base to C2.  Query history of rheumatoid arthritis. 3.  Widespread cervical spine degenerative changes elsewhere.  Original Report Authenticated By: Harley Hallmark, M.D.   Ct Cervical Spine Wo Contrast  01/21/2012  *RADIOLOGY REPORT*  Clinical Data:  78 year old female status post fall with laceration and pain.  CT HEAD WITHOUT CONTRAST CT  CERVICAL SPINE WITHOUT CONTRAST  Technique:  Multidetector CT imaging of the head and cervical spine was performed following the standard protocol without intravenous contrast.  Multiplanar CT image reconstructions of the cervical spine were also generated.  Comparison:  Head CT 09/27/2005.  CT HEAD  Findings: Paranasal sinuses are clear except for minimal mucosal thickening in the left maxillary.  Mastoids and tympanic cavities are clear.  No focal scalp hematoma.  Postoperative changes to the globes.  Calvarium intact.  Calcified atherosclerosis at the skull base.  No acute intracranial hemorrhage identified.  No ventriculomegaly.  No intracranial mass lesions; effacement of the ventral cisterna magna is related to arthropathic changes at the odontoid, see below.  Patchy cerebral white matter and deep gray matter hypodensity. No evidence of cortically based acute infarction identified.  No suspicious intracranial vascular hyperdensity.  IMPRESSION: 1.  No acute traumatic injury to the brain. 2.  See cervical spine findings below.  CT CERVICAL SPINE  Findings: Large soft tissue pannus along the dorsal odontoid and posterior C2 vertebral body with erosive changes.  Faint calcifications within the pannus.  No odontoid or C2 fracture is identified.  C1-C2 alignment remains within normal limits, there is joint space loss especially at the left C1-C2 lateral mass  articulation.  Mild erosive changes at the right occipital condyle. The skull base otherwise appears intact.  2 mm of anterolisthesis of C3 on C4.  The left C3-C4 posterior elements appear ankylosed.  Diffuse cervical disc space loss elsewhere.  2-3 mm of anterolisthesis of C7 on T1 with associated chronic facet hypertrophy.  Advanced T1-T2 disc space loss also. no acute cervical fracture is identified.  Visualized upper thoracic levels appear grossly intact.  Scarring and some calcified pleural plaques at the lung apices. Calcified atherosclerosis.  Sub centimeter hypodense and partially calcified thyroid nodules.  Paraspinal soft tissues otherwise are within normal limits.  IMPRESSION: 1. No acute fracture or listhesis identified in the cervical spine. Ligamentous injury is not excluded. 2.  Erosive arthropathy with large soft tissue pannus from the skull base to C2.  Query history of rheumatoid arthritis. 3.  Widespread cervical spine degenerative changes elsewhere.  Original Report Authenticated By: Ulla Potash III, M.D.     1. Minor head injury   2. Facial laceration   3. Fall       MDM  No acute findings on imaging with patient alert and oriented and ambulating without difficulty. Lac repaired well. Friend at bedside to take home. Agreeable to close follow up with PCP for suture removal and wound recheck.         Drucie Opitz, [place] 01/21/12 1919

## 2012-01-24 NOTE — ED Provider Notes (Signed)
Medical screening examination/treatment/procedure(s) were performed by non-physician practitioner and as supervising physician I was immediately available for consultation/collaboration.   Laray Anger, DO 01/24/12 1256

## 2012-01-28 DIAGNOSIS — G4700 Insomnia, unspecified: Secondary | ICD-10-CM | POA: Diagnosis not present

## 2012-01-28 DIAGNOSIS — Z4802 Encounter for removal of sutures: Secondary | ICD-10-CM | POA: Diagnosis not present

## 2012-01-28 DIAGNOSIS — M542 Cervicalgia: Secondary | ICD-10-CM | POA: Diagnosis not present

## 2012-03-03 DIAGNOSIS — H40029 Open angle with borderline findings, high risk, unspecified eye: Secondary | ICD-10-CM | POA: Diagnosis not present

## 2012-03-03 DIAGNOSIS — H04129 Dry eye syndrome of unspecified lacrimal gland: Secondary | ICD-10-CM | POA: Diagnosis not present

## 2012-03-30 DIAGNOSIS — Z79899 Other long term (current) drug therapy: Secondary | ICD-10-CM | POA: Diagnosis not present

## 2012-03-30 DIAGNOSIS — Z0000 Encounter for general adult medical examination without abnormal findings: Secondary | ICD-10-CM | POA: Diagnosis not present

## 2012-03-30 DIAGNOSIS — M810 Age-related osteoporosis without current pathological fracture: Secondary | ICD-10-CM | POA: Diagnosis not present

## 2012-03-30 DIAGNOSIS — Z1331 Encounter for screening for depression: Secondary | ICD-10-CM | POA: Diagnosis not present

## 2012-03-30 DIAGNOSIS — E7800 Pure hypercholesterolemia, unspecified: Secondary | ICD-10-CM | POA: Diagnosis not present

## 2012-03-30 DIAGNOSIS — I2510 Atherosclerotic heart disease of native coronary artery without angina pectoris: Secondary | ICD-10-CM | POA: Diagnosis not present

## 2012-05-11 DIAGNOSIS — M810 Age-related osteoporosis without current pathological fracture: Secondary | ICD-10-CM | POA: Diagnosis not present

## 2012-05-14 DIAGNOSIS — Z23 Encounter for immunization: Secondary | ICD-10-CM | POA: Diagnosis not present

## 2012-08-02 DIAGNOSIS — I10 Essential (primary) hypertension: Secondary | ICD-10-CM | POA: Diagnosis not present

## 2012-08-02 DIAGNOSIS — R0602 Shortness of breath: Secondary | ICD-10-CM | POA: Diagnosis not present

## 2012-08-02 DIAGNOSIS — M79609 Pain in unspecified limb: Secondary | ICD-10-CM | POA: Diagnosis not present

## 2012-11-17 DIAGNOSIS — H353190 Nonexudative age-related macular degeneration, unspecified eye, stage unspecified: Secondary | ICD-10-CM | POA: Diagnosis not present

## 2012-11-17 DIAGNOSIS — H43399 Other vitreous opacities, unspecified eye: Secondary | ICD-10-CM | POA: Diagnosis not present

## 2012-11-17 DIAGNOSIS — Z961 Presence of intraocular lens: Secondary | ICD-10-CM | POA: Diagnosis not present

## 2012-11-17 DIAGNOSIS — H40029 Open angle with borderline findings, high risk, unspecified eye: Secondary | ICD-10-CM | POA: Diagnosis not present

## 2012-11-18 DIAGNOSIS — H35369 Drusen (degenerative) of macula, unspecified eye: Secondary | ICD-10-CM | POA: Diagnosis not present

## 2012-11-18 DIAGNOSIS — H353190 Nonexudative age-related macular degeneration, unspecified eye, stage unspecified: Secondary | ICD-10-CM | POA: Diagnosis not present

## 2012-11-25 DIAGNOSIS — H353290 Exudative age-related macular degeneration, unspecified eye, stage unspecified: Secondary | ICD-10-CM | POA: Diagnosis not present

## 2012-11-25 DIAGNOSIS — H35059 Retinal neovascularization, unspecified, unspecified eye: Secondary | ICD-10-CM | POA: Diagnosis not present

## 2012-11-29 DIAGNOSIS — Z23 Encounter for immunization: Secondary | ICD-10-CM | POA: Diagnosis not present

## 2012-11-29 DIAGNOSIS — I10 Essential (primary) hypertension: Secondary | ICD-10-CM | POA: Diagnosis not present

## 2013-01-03 DIAGNOSIS — H353290 Exudative age-related macular degeneration, unspecified eye, stage unspecified: Secondary | ICD-10-CM | POA: Diagnosis not present

## 2013-01-03 DIAGNOSIS — H35059 Retinal neovascularization, unspecified, unspecified eye: Secondary | ICD-10-CM | POA: Diagnosis not present

## 2013-01-03 DIAGNOSIS — H35359 Cystoid macular degeneration, unspecified eye: Secondary | ICD-10-CM | POA: Diagnosis not present

## 2013-02-07 DIAGNOSIS — H353290 Exudative age-related macular degeneration, unspecified eye, stage unspecified: Secondary | ICD-10-CM | POA: Diagnosis not present

## 2013-02-07 DIAGNOSIS — H35059 Retinal neovascularization, unspecified, unspecified eye: Secondary | ICD-10-CM | POA: Diagnosis not present

## 2013-02-17 DIAGNOSIS — H1045 Other chronic allergic conjunctivitis: Secondary | ICD-10-CM | POA: Diagnosis not present

## 2013-02-17 DIAGNOSIS — H40029 Open angle with borderline findings, high risk, unspecified eye: Secondary | ICD-10-CM | POA: Diagnosis not present

## 2013-02-17 DIAGNOSIS — H04129 Dry eye syndrome of unspecified lacrimal gland: Secondary | ICD-10-CM | POA: Diagnosis not present

## 2013-02-22 DIAGNOSIS — S61209A Unspecified open wound of unspecified finger without damage to nail, initial encounter: Secondary | ICD-10-CM | POA: Diagnosis not present

## 2013-02-24 DIAGNOSIS — S61209A Unspecified open wound of unspecified finger without damage to nail, initial encounter: Secondary | ICD-10-CM | POA: Diagnosis not present

## 2013-03-16 DIAGNOSIS — H353290 Exudative age-related macular degeneration, unspecified eye, stage unspecified: Secondary | ICD-10-CM | POA: Diagnosis not present

## 2013-03-16 DIAGNOSIS — H35059 Retinal neovascularization, unspecified, unspecified eye: Secondary | ICD-10-CM | POA: Diagnosis not present

## 2013-04-04 DIAGNOSIS — I10 Essential (primary) hypertension: Secondary | ICD-10-CM | POA: Diagnosis not present

## 2013-04-04 DIAGNOSIS — E7800 Pure hypercholesterolemia, unspecified: Secondary | ICD-10-CM | POA: Diagnosis not present

## 2013-04-04 DIAGNOSIS — Z79899 Other long term (current) drug therapy: Secondary | ICD-10-CM | POA: Diagnosis not present

## 2013-04-04 DIAGNOSIS — Z1331 Encounter for screening for depression: Secondary | ICD-10-CM | POA: Diagnosis not present

## 2013-04-04 DIAGNOSIS — Z0000 Encounter for general adult medical examination without abnormal findings: Secondary | ICD-10-CM | POA: Diagnosis not present

## 2013-04-19 DIAGNOSIS — H353290 Exudative age-related macular degeneration, unspecified eye, stage unspecified: Secondary | ICD-10-CM | POA: Diagnosis not present

## 2013-04-19 DIAGNOSIS — H35059 Retinal neovascularization, unspecified, unspecified eye: Secondary | ICD-10-CM | POA: Diagnosis not present

## 2013-05-12 DIAGNOSIS — Z23 Encounter for immunization: Secondary | ICD-10-CM | POA: Diagnosis not present

## 2013-05-24 DIAGNOSIS — H353290 Exudative age-related macular degeneration, unspecified eye, stage unspecified: Secondary | ICD-10-CM | POA: Diagnosis not present

## 2013-05-24 DIAGNOSIS — H35059 Retinal neovascularization, unspecified, unspecified eye: Secondary | ICD-10-CM | POA: Diagnosis not present

## 2013-06-30 DIAGNOSIS — M25519 Pain in unspecified shoulder: Secondary | ICD-10-CM | POA: Diagnosis not present

## 2013-07-05 DIAGNOSIS — H35059 Retinal neovascularization, unspecified, unspecified eye: Secondary | ICD-10-CM | POA: Diagnosis not present

## 2013-07-05 DIAGNOSIS — H353290 Exudative age-related macular degeneration, unspecified eye, stage unspecified: Secondary | ICD-10-CM | POA: Diagnosis not present

## 2013-08-30 DIAGNOSIS — H35059 Retinal neovascularization, unspecified, unspecified eye: Secondary | ICD-10-CM | POA: Diagnosis not present

## 2013-08-30 DIAGNOSIS — H353290 Exudative age-related macular degeneration, unspecified eye, stage unspecified: Secondary | ICD-10-CM | POA: Diagnosis not present

## 2013-10-03 DIAGNOSIS — M25519 Pain in unspecified shoulder: Secondary | ICD-10-CM | POA: Diagnosis not present

## 2013-10-03 DIAGNOSIS — H9190 Unspecified hearing loss, unspecified ear: Secondary | ICD-10-CM | POA: Diagnosis not present

## 2013-10-03 DIAGNOSIS — I10 Essential (primary) hypertension: Secondary | ICD-10-CM | POA: Diagnosis not present

## 2013-10-03 DIAGNOSIS — Z79899 Other long term (current) drug therapy: Secondary | ICD-10-CM | POA: Diagnosis not present

## 2013-10-03 DIAGNOSIS — Z23 Encounter for immunization: Secondary | ICD-10-CM | POA: Diagnosis not present

## 2013-11-08 DIAGNOSIS — H35359 Cystoid macular degeneration, unspecified eye: Secondary | ICD-10-CM | POA: Diagnosis not present

## 2013-11-08 DIAGNOSIS — H35059 Retinal neovascularization, unspecified, unspecified eye: Secondary | ICD-10-CM | POA: Diagnosis not present

## 2013-11-08 DIAGNOSIS — H353290 Exudative age-related macular degeneration, unspecified eye, stage unspecified: Secondary | ICD-10-CM | POA: Diagnosis not present

## 2013-11-15 DIAGNOSIS — IMO0002 Reserved for concepts with insufficient information to code with codable children: Secondary | ICD-10-CM | POA: Diagnosis not present

## 2013-11-15 DIAGNOSIS — M999 Biomechanical lesion, unspecified: Secondary | ICD-10-CM | POA: Diagnosis not present

## 2013-11-15 DIAGNOSIS — M5030 Other cervical disc degeneration, unspecified cervical region: Secondary | ICD-10-CM | POA: Diagnosis not present

## 2013-11-15 DIAGNOSIS — M9981 Other biomechanical lesions of cervical region: Secondary | ICD-10-CM | POA: Diagnosis not present

## 2013-11-16 DIAGNOSIS — M5030 Other cervical disc degeneration, unspecified cervical region: Secondary | ICD-10-CM | POA: Diagnosis not present

## 2013-11-16 DIAGNOSIS — M999 Biomechanical lesion, unspecified: Secondary | ICD-10-CM | POA: Diagnosis not present

## 2013-11-16 DIAGNOSIS — M9981 Other biomechanical lesions of cervical region: Secondary | ICD-10-CM | POA: Diagnosis not present

## 2013-11-16 DIAGNOSIS — IMO0002 Reserved for concepts with insufficient information to code with codable children: Secondary | ICD-10-CM | POA: Diagnosis not present

## 2013-11-17 DIAGNOSIS — M9981 Other biomechanical lesions of cervical region: Secondary | ICD-10-CM | POA: Diagnosis not present

## 2013-11-17 DIAGNOSIS — M5030 Other cervical disc degeneration, unspecified cervical region: Secondary | ICD-10-CM | POA: Diagnosis not present

## 2013-11-17 DIAGNOSIS — M999 Biomechanical lesion, unspecified: Secondary | ICD-10-CM | POA: Diagnosis not present

## 2013-11-17 DIAGNOSIS — IMO0002 Reserved for concepts with insufficient information to code with codable children: Secondary | ICD-10-CM | POA: Diagnosis not present

## 2013-11-21 DIAGNOSIS — IMO0002 Reserved for concepts with insufficient information to code with codable children: Secondary | ICD-10-CM | POA: Diagnosis not present

## 2013-11-21 DIAGNOSIS — M5030 Other cervical disc degeneration, unspecified cervical region: Secondary | ICD-10-CM | POA: Diagnosis not present

## 2013-11-21 DIAGNOSIS — M9981 Other biomechanical lesions of cervical region: Secondary | ICD-10-CM | POA: Diagnosis not present

## 2013-11-21 DIAGNOSIS — M999 Biomechanical lesion, unspecified: Secondary | ICD-10-CM | POA: Diagnosis not present

## 2013-11-23 DIAGNOSIS — M9981 Other biomechanical lesions of cervical region: Secondary | ICD-10-CM | POA: Diagnosis not present

## 2013-11-23 DIAGNOSIS — IMO0002 Reserved for concepts with insufficient information to code with codable children: Secondary | ICD-10-CM | POA: Diagnosis not present

## 2013-11-23 DIAGNOSIS — M5030 Other cervical disc degeneration, unspecified cervical region: Secondary | ICD-10-CM | POA: Diagnosis not present

## 2013-11-23 DIAGNOSIS — M999 Biomechanical lesion, unspecified: Secondary | ICD-10-CM | POA: Diagnosis not present

## 2013-11-29 DIAGNOSIS — H40019 Open angle with borderline findings, low risk, unspecified eye: Secondary | ICD-10-CM | POA: Diagnosis not present

## 2013-11-29 DIAGNOSIS — H353190 Nonexudative age-related macular degeneration, unspecified eye, stage unspecified: Secondary | ICD-10-CM | POA: Diagnosis not present

## 2013-11-29 DIAGNOSIS — H35039 Hypertensive retinopathy, unspecified eye: Secondary | ICD-10-CM | POA: Diagnosis not present

## 2013-11-29 DIAGNOSIS — H524 Presbyopia: Secondary | ICD-10-CM | POA: Diagnosis not present

## 2013-11-30 DIAGNOSIS — M5030 Other cervical disc degeneration, unspecified cervical region: Secondary | ICD-10-CM | POA: Diagnosis not present

## 2013-11-30 DIAGNOSIS — IMO0002 Reserved for concepts with insufficient information to code with codable children: Secondary | ICD-10-CM | POA: Diagnosis not present

## 2013-11-30 DIAGNOSIS — M999 Biomechanical lesion, unspecified: Secondary | ICD-10-CM | POA: Diagnosis not present

## 2013-11-30 DIAGNOSIS — M9981 Other biomechanical lesions of cervical region: Secondary | ICD-10-CM | POA: Diagnosis not present

## 2013-12-01 DIAGNOSIS — M999 Biomechanical lesion, unspecified: Secondary | ICD-10-CM | POA: Diagnosis not present

## 2013-12-01 DIAGNOSIS — M5030 Other cervical disc degeneration, unspecified cervical region: Secondary | ICD-10-CM | POA: Diagnosis not present

## 2013-12-01 DIAGNOSIS — M9981 Other biomechanical lesions of cervical region: Secondary | ICD-10-CM | POA: Diagnosis not present

## 2013-12-01 DIAGNOSIS — IMO0002 Reserved for concepts with insufficient information to code with codable children: Secondary | ICD-10-CM | POA: Diagnosis not present

## 2013-12-05 DIAGNOSIS — IMO0002 Reserved for concepts with insufficient information to code with codable children: Secondary | ICD-10-CM | POA: Diagnosis not present

## 2013-12-05 DIAGNOSIS — M9981 Other biomechanical lesions of cervical region: Secondary | ICD-10-CM | POA: Diagnosis not present

## 2013-12-05 DIAGNOSIS — M5030 Other cervical disc degeneration, unspecified cervical region: Secondary | ICD-10-CM | POA: Diagnosis not present

## 2013-12-05 DIAGNOSIS — M999 Biomechanical lesion, unspecified: Secondary | ICD-10-CM | POA: Diagnosis not present

## 2013-12-07 DIAGNOSIS — M9981 Other biomechanical lesions of cervical region: Secondary | ICD-10-CM | POA: Diagnosis not present

## 2013-12-07 DIAGNOSIS — M5030 Other cervical disc degeneration, unspecified cervical region: Secondary | ICD-10-CM | POA: Diagnosis not present

## 2013-12-07 DIAGNOSIS — IMO0002 Reserved for concepts with insufficient information to code with codable children: Secondary | ICD-10-CM | POA: Diagnosis not present

## 2013-12-07 DIAGNOSIS — M999 Biomechanical lesion, unspecified: Secondary | ICD-10-CM | POA: Diagnosis not present

## 2013-12-12 DIAGNOSIS — M5030 Other cervical disc degeneration, unspecified cervical region: Secondary | ICD-10-CM | POA: Diagnosis not present

## 2013-12-12 DIAGNOSIS — M9981 Other biomechanical lesions of cervical region: Secondary | ICD-10-CM | POA: Diagnosis not present

## 2013-12-12 DIAGNOSIS — IMO0002 Reserved for concepts with insufficient information to code with codable children: Secondary | ICD-10-CM | POA: Diagnosis not present

## 2013-12-12 DIAGNOSIS — M999 Biomechanical lesion, unspecified: Secondary | ICD-10-CM | POA: Diagnosis not present

## 2013-12-14 DIAGNOSIS — IMO0002 Reserved for concepts with insufficient information to code with codable children: Secondary | ICD-10-CM | POA: Diagnosis not present

## 2013-12-14 DIAGNOSIS — M999 Biomechanical lesion, unspecified: Secondary | ICD-10-CM | POA: Diagnosis not present

## 2013-12-14 DIAGNOSIS — M9981 Other biomechanical lesions of cervical region: Secondary | ICD-10-CM | POA: Diagnosis not present

## 2013-12-14 DIAGNOSIS — M5030 Other cervical disc degeneration, unspecified cervical region: Secondary | ICD-10-CM | POA: Diagnosis not present

## 2013-12-21 DIAGNOSIS — M999 Biomechanical lesion, unspecified: Secondary | ICD-10-CM | POA: Diagnosis not present

## 2013-12-21 DIAGNOSIS — IMO0002 Reserved for concepts with insufficient information to code with codable children: Secondary | ICD-10-CM | POA: Diagnosis not present

## 2013-12-21 DIAGNOSIS — M9981 Other biomechanical lesions of cervical region: Secondary | ICD-10-CM | POA: Diagnosis not present

## 2013-12-21 DIAGNOSIS — M5030 Other cervical disc degeneration, unspecified cervical region: Secondary | ICD-10-CM | POA: Diagnosis not present

## 2013-12-22 DIAGNOSIS — M5030 Other cervical disc degeneration, unspecified cervical region: Secondary | ICD-10-CM | POA: Diagnosis not present

## 2013-12-22 DIAGNOSIS — IMO0002 Reserved for concepts with insufficient information to code with codable children: Secondary | ICD-10-CM | POA: Diagnosis not present

## 2013-12-22 DIAGNOSIS — M999 Biomechanical lesion, unspecified: Secondary | ICD-10-CM | POA: Diagnosis not present

## 2013-12-22 DIAGNOSIS — M9981 Other biomechanical lesions of cervical region: Secondary | ICD-10-CM | POA: Diagnosis not present

## 2013-12-26 DIAGNOSIS — IMO0002 Reserved for concepts with insufficient information to code with codable children: Secondary | ICD-10-CM | POA: Diagnosis not present

## 2013-12-26 DIAGNOSIS — M9981 Other biomechanical lesions of cervical region: Secondary | ICD-10-CM | POA: Diagnosis not present

## 2013-12-26 DIAGNOSIS — M5030 Other cervical disc degeneration, unspecified cervical region: Secondary | ICD-10-CM | POA: Diagnosis not present

## 2013-12-26 DIAGNOSIS — M999 Biomechanical lesion, unspecified: Secondary | ICD-10-CM | POA: Diagnosis not present

## 2013-12-29 DIAGNOSIS — M5030 Other cervical disc degeneration, unspecified cervical region: Secondary | ICD-10-CM | POA: Diagnosis not present

## 2013-12-29 DIAGNOSIS — IMO0002 Reserved for concepts with insufficient information to code with codable children: Secondary | ICD-10-CM | POA: Diagnosis not present

## 2013-12-29 DIAGNOSIS — M9981 Other biomechanical lesions of cervical region: Secondary | ICD-10-CM | POA: Diagnosis not present

## 2013-12-29 DIAGNOSIS — M999 Biomechanical lesion, unspecified: Secondary | ICD-10-CM | POA: Diagnosis not present

## 2014-01-04 DIAGNOSIS — M999 Biomechanical lesion, unspecified: Secondary | ICD-10-CM | POA: Diagnosis not present

## 2014-01-04 DIAGNOSIS — M5030 Other cervical disc degeneration, unspecified cervical region: Secondary | ICD-10-CM | POA: Diagnosis not present

## 2014-01-04 DIAGNOSIS — IMO0002 Reserved for concepts with insufficient information to code with codable children: Secondary | ICD-10-CM | POA: Diagnosis not present

## 2014-01-04 DIAGNOSIS — M9981 Other biomechanical lesions of cervical region: Secondary | ICD-10-CM | POA: Diagnosis not present

## 2014-01-11 DIAGNOSIS — M9981 Other biomechanical lesions of cervical region: Secondary | ICD-10-CM | POA: Diagnosis not present

## 2014-01-11 DIAGNOSIS — M5030 Other cervical disc degeneration, unspecified cervical region: Secondary | ICD-10-CM | POA: Diagnosis not present

## 2014-01-11 DIAGNOSIS — IMO0002 Reserved for concepts with insufficient information to code with codable children: Secondary | ICD-10-CM | POA: Diagnosis not present

## 2014-01-11 DIAGNOSIS — M999 Biomechanical lesion, unspecified: Secondary | ICD-10-CM | POA: Diagnosis not present

## 2014-01-31 DIAGNOSIS — H353290 Exudative age-related macular degeneration, unspecified eye, stage unspecified: Secondary | ICD-10-CM | POA: Diagnosis not present

## 2014-01-31 DIAGNOSIS — H353190 Nonexudative age-related macular degeneration, unspecified eye, stage unspecified: Secondary | ICD-10-CM | POA: Diagnosis not present

## 2014-01-31 DIAGNOSIS — H35059 Retinal neovascularization, unspecified, unspecified eye: Secondary | ICD-10-CM | POA: Diagnosis not present

## 2014-02-05 ENCOUNTER — Emergency Department (HOSPITAL_COMMUNITY): Payer: No Typology Code available for payment source

## 2014-02-05 ENCOUNTER — Encounter (HOSPITAL_COMMUNITY): Payer: Self-pay | Admitting: Emergency Medicine

## 2014-02-05 ENCOUNTER — Emergency Department (HOSPITAL_COMMUNITY)
Admission: EM | Admit: 2014-02-05 | Discharge: 2014-02-05 | Disposition: A | Discharge status: Home / Self Care | Payer: No Typology Code available for payment source | Attending: Emergency Medicine | Admitting: Emergency Medicine

## 2014-02-05 DIAGNOSIS — Y9389 Activity, other specified: Secondary | ICD-10-CM | POA: Diagnosis not present

## 2014-02-05 DIAGNOSIS — S139XXA Sprain of joints and ligaments of unspecified parts of neck, initial encounter: Secondary | ICD-10-CM | POA: Insufficient documentation

## 2014-02-05 DIAGNOSIS — S0990XA Unspecified injury of head, initial encounter: Secondary | ICD-10-CM | POA: Diagnosis present

## 2014-02-05 DIAGNOSIS — Z7982 Long term (current) use of aspirin: Secondary | ICD-10-CM | POA: Diagnosis not present

## 2014-02-05 DIAGNOSIS — Z9889 Other specified postprocedural states: Secondary | ICD-10-CM | POA: Insufficient documentation

## 2014-02-05 DIAGNOSIS — E785 Hyperlipidemia, unspecified: Secondary | ICD-10-CM | POA: Insufficient documentation

## 2014-02-05 DIAGNOSIS — Z8739 Personal history of other diseases of the musculoskeletal system and connective tissue: Secondary | ICD-10-CM | POA: Insufficient documentation

## 2014-02-05 DIAGNOSIS — S335XXA Sprain of ligaments of lumbar spine, initial encounter: Secondary | ICD-10-CM | POA: Diagnosis not present

## 2014-02-05 DIAGNOSIS — S3981XA Other specified injuries of abdomen, initial encounter: Secondary | ICD-10-CM | POA: Diagnosis not present

## 2014-02-05 DIAGNOSIS — M542 Cervicalgia: Secondary | ICD-10-CM | POA: Diagnosis not present

## 2014-02-05 DIAGNOSIS — Z79899 Other long term (current) drug therapy: Secondary | ICD-10-CM | POA: Diagnosis not present

## 2014-02-05 DIAGNOSIS — Y92410 Unspecified street and highway as the place of occurrence of the external cause: Secondary | ICD-10-CM | POA: Diagnosis not present

## 2014-02-05 DIAGNOSIS — Z951 Presence of aortocoronary bypass graft: Secondary | ICD-10-CM | POA: Diagnosis not present

## 2014-02-05 DIAGNOSIS — S20219S Contusion of unspecified front wall of thorax, sequela: Secondary | ICD-10-CM

## 2014-02-05 DIAGNOSIS — S339XXA Sprain of unspecified parts of lumbar spine and pelvis, initial encounter: Secondary | ICD-10-CM | POA: Insufficient documentation

## 2014-02-05 DIAGNOSIS — IMO0002 Reserved for concepts with insufficient information to code with codable children: Secondary | ICD-10-CM | POA: Diagnosis not present

## 2014-02-05 DIAGNOSIS — N1330 Unspecified hydronephrosis: Secondary | ICD-10-CM | POA: Diagnosis not present

## 2014-02-05 DIAGNOSIS — I2510 Atherosclerotic heart disease of native coronary artery without angina pectoris: Secondary | ICD-10-CM | POA: Diagnosis not present

## 2014-02-05 DIAGNOSIS — S0190XA Unspecified open wound of unspecified part of head, initial encounter: Secondary | ICD-10-CM | POA: Diagnosis not present

## 2014-02-05 DIAGNOSIS — S39012A Strain of muscle, fascia and tendon of lower back, initial encounter: Secondary | ICD-10-CM

## 2014-02-05 DIAGNOSIS — R079 Chest pain, unspecified: Secondary | ICD-10-CM | POA: Diagnosis not present

## 2014-02-05 DIAGNOSIS — S0993XA Unspecified injury of face, initial encounter: Secondary | ICD-10-CM | POA: Diagnosis not present

## 2014-02-05 DIAGNOSIS — S20219A Contusion of unspecified front wall of thorax, initial encounter: Secondary | ICD-10-CM | POA: Diagnosis not present

## 2014-02-05 DIAGNOSIS — I10 Essential (primary) hypertension: Secondary | ICD-10-CM | POA: Diagnosis not present

## 2014-02-05 DIAGNOSIS — T1490XA Injury, unspecified, initial encounter: Secondary | ICD-10-CM | POA: Diagnosis not present

## 2014-02-05 DIAGNOSIS — S161XXA Strain of muscle, fascia and tendon at neck level, initial encounter: Secondary | ICD-10-CM

## 2014-02-05 HISTORY — DX: Spinal stenosis, site unspecified: M48.00

## 2014-02-05 LAB — I-STAT CHEM 8, ED
BUN: 24 mg/dL — ABNORMAL HIGH (ref 6–23)
CREATININE: 0.9 mg/dL (ref 0.50–1.10)
Calcium, Ion: 1.21 mmol/L (ref 1.13–1.30)
Chloride: 105 mEq/L (ref 96–112)
Glucose, Bld: 96 mg/dL (ref 70–99)
HEMATOCRIT: 46 % (ref 36.0–46.0)
HEMOGLOBIN: 15.6 g/dL — AB (ref 12.0–15.0)
POTASSIUM: 4.1 meq/L (ref 3.7–5.3)
SODIUM: 139 meq/L (ref 137–147)
TCO2: 23 mmol/L (ref 0–100)

## 2014-02-05 LAB — CBC WITH DIFFERENTIAL/PLATELET
BASOS ABS: 0 10*3/uL (ref 0.0–0.1)
Basophils Relative: 0 % (ref 0–1)
EOS ABS: 0.2 10*3/uL (ref 0.0–0.7)
EOS PCT: 3 % (ref 0–5)
HCT: 42 % (ref 36.0–46.0)
Hemoglobin: 14 g/dL (ref 12.0–15.0)
LYMPHS ABS: 2.5 10*3/uL (ref 0.7–4.0)
Lymphocytes Relative: 31 % (ref 12–46)
MCH: 30.5 pg (ref 26.0–34.0)
MCHC: 33.3 g/dL (ref 30.0–36.0)
MCV: 91.5 fL (ref 78.0–100.0)
Monocytes Absolute: 0.6 10*3/uL (ref 0.1–1.0)
Monocytes Relative: 8 % (ref 3–12)
NEUTROS PCT: 58 % (ref 43–77)
Neutro Abs: 4.7 10*3/uL (ref 1.7–7.7)
PLATELETS: 269 10*3/uL (ref 150–400)
RBC: 4.59 MIL/uL (ref 3.87–5.11)
RDW: 13.4 % (ref 11.5–15.5)
WBC: 8 10*3/uL (ref 4.0–10.5)

## 2014-02-05 LAB — COMPREHENSIVE METABOLIC PANEL
ALBUMIN: 4 g/dL (ref 3.5–5.2)
ALK PHOS: 65 U/L (ref 39–117)
ALT: 21 U/L (ref 0–35)
ANION GAP: 17 — AB (ref 5–15)
AST: 29 U/L (ref 0–37)
BILIRUBIN TOTAL: 0.4 mg/dL (ref 0.3–1.2)
BUN: 24 mg/dL — ABNORMAL HIGH (ref 6–23)
CHLORIDE: 99 meq/L (ref 96–112)
CO2: 23 mEq/L (ref 19–32)
Calcium: 9.9 mg/dL (ref 8.4–10.5)
Creatinine, Ser: 0.66 mg/dL (ref 0.50–1.10)
GFR calc Af Amer: 84 mL/min — ABNORMAL LOW (ref >90)
GFR calc non Af Amer: 72 mL/min — ABNORMAL LOW (ref >90)
Glucose, Bld: 95 mg/dL (ref 70–99)
POTASSIUM: 4.1 meq/L (ref 3.7–5.3)
SODIUM: 139 meq/L (ref 137–147)
TOTAL PROTEIN: 7.8 g/dL (ref 6.0–8.3)

## 2014-02-05 MED ORDER — IOHEXOL 300 MG/ML  SOLN
100.0000 mL | Freq: Once | INTRAMUSCULAR | Status: AC | PRN
  Administered 2014-02-05: 80 mL via INTRAVENOUS

## 2014-02-05 NOTE — ED Notes (Signed)
Bladder scan after pt voided-[id number]
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
## 2014-02-05 NOTE — Discharge Instructions (Signed)
Cervical Sprain °A cervical sprain is an injury in the neck in which the strong, fibrous tissues (ligaments) that connect your neck bones stretch or tear. Cervical sprains can range from mild to severe. Severe cervical sprains can cause the neck vertebrae to be unstable. This can lead to damage of the spinal cord and can result in serious nervous system problems. The amount of time it takes for a cervical sprain to get better depends on the cause and extent of the injury. Most cervical sprains heal in 1 to 3 weeks. °CAUSES  °Severe cervical sprains may be caused by:  °· Contact sport injuries (such as from football, rugby, wrestling, hockey, auto racing, gymnastics, diving, martial arts, or boxing).   °· Motor vehicle collisions.   °· Whiplash injuries. This is an injury from a sudden forward and backward whipping movement of the head and neck.  °· Falls.   °Mild cervical sprains may be caused by:  °· Being in an awkward position, such as while cradling a telephone between your ear and shoulder.   °· Sitting in a chair that does not offer proper support.   °· Working at a poorly designed computer station.   °· Looking up or down for long periods of time.   °SYMPTOMS  °· Pain, soreness, stiffness, or a burning sensation in the front, back, or sides of the neck. This discomfort may develop immediately after the injury or slowly, 24 hours or more after the injury.   °· Pain or tenderness directly in the middle of the back of the neck.   °· Shoulder or upper back pain.   °· Limited ability to move the neck.   °· Headache.   °· Dizziness.   °· Weakness, numbness, or tingling in the hands or arms.   °· Muscle spasms.   °· Difficulty swallowing or chewing.   °· Tenderness and swelling of the neck.   °DIAGNOSIS  °Most of the time your health care provider can diagnose a cervical sprain by taking your history and doing a physical exam. Your health care provider will ask about previous neck injuries and any known neck  problems, such as arthritis in the neck. X-rays may be taken to find out if there are any other problems, such as with the bones of the neck. Other tests, such as a CT scan or MRI, may also be needed.  °TREATMENT  °Treatment depends on the severity of the cervical sprain. Mild sprains can be treated with rest, keeping the neck in place (immobilization), and pain medicines. Severe cervical sprains are immediately immobilized. Further treatment is done to help with pain, muscle spasms, and other symptoms and may include: °· Medicines, such as pain relievers, numbing medicines, or muscle relaxants.   °· Physical therapy. This may involve stretching exercises, strengthening exercises, and posture training. Exercises and improved posture can help stabilize the neck, strengthen muscles, and help stop symptoms from returning.   °HOME CARE INSTRUCTIONS  °· Put ice on the injured area.   °· Put ice in a plastic bag.   °· Place a towel between your skin and the bag.   °· Leave the ice on for 15-20 minutes, 3-4 times a day.   °· If your injury was severe, you may have been given a cervical collar to wear. A cervical collar is a two-piece collar designed to keep your neck from moving while it heals. °· Do not remove the collar unless instructed by your health care provider. °· If you have long hair, keep it outside of the collar. °· Ask your health care provider before making any adjustments to your collar. Minor   adjustments may be required over time to improve comfort and reduce pressure on your chin or on the back of your head. °· If you are allowed to remove the collar for cleaning or bathing, follow your health care provider's instructions on how to do so safely. °· Keep your collar clean by wiping it with mild soap and water and drying it completely. If the collar you have been given includes removable pads, remove them every 1-2 days and hand wash them with soap and water. Allow them to air dry. They should be completely  dry before you wear them in the collar. °· If you are allowed to remove the collar for cleaning and bathing, wash and dry the skin of your neck. Check your skin for irritation or sores. If you see any, tell your health care provider. °· Do not drive while wearing the collar.   °· Only take over-the-counter or prescription medicines for pain, discomfort, or fever as directed by your health care provider.   °· Keep all follow-up appointments as directed by your health care provider.   °· Keep all physical therapy appointments as directed by your health care provider.   °· Make any needed adjustments to your workstation to promote good posture.   °· Avoid positions and activities that make your symptoms worse.   °· Warm up and stretch before being active to help prevent problems.   °SEEK MEDICAL CARE IF:  °· Your pain is not controlled with medicine.   °· You are unable to decrease your pain medicine over time as planned.   °· Your activity level is not improving as expected.   °SEEK IMMEDIATE MEDICAL CARE IF:  °· You develop any bleeding. °· You develop stomach upset. °· You have signs of an allergic reaction to your medicine.   °· Your symptoms get worse.   °· You develop new, unexplained symptoms.   °· You have numbness, tingling, weakness, or paralysis in any part of your body.   °MAKE SURE YOU:  °· Understand these instructions. °· Will watch your condition. °· Will get help right away if you are not doing well or get worse. °Document Released: 05/11/2007 Document Revised: 07/19/2013 Document Reviewed: 01/19/2013 °[employer]® Patient Information ©2015 [employer]. This information is not intended to replace advice given to you by your health care provider. Make sure you discuss any questions you have with your health care provider. ° °Lumbosacral Strain °Lumbosacral strain is a strain of any of the parts that make up your lumbosacral vertebrae. Your lumbosacral vertebrae are the bones that make up the lower third  of your backbone. Your lumbosacral vertebrae are held together by muscles and tough, fibrous tissue (ligaments).  °CAUSES  °A sudden blow to your back can cause lumbosacral strain. Also, anything that causes an excessive stretch of the muscles in the low back can cause this strain. This is typically seen when people exert themselves strenuously, fall, lift heavy objects, bend, or crouch repeatedly. °RISK FACTORS °· Physically demanding work. °· Participation in pushing or pulling sports or sports that require a sudden twist of the back (tennis, golf, baseball). °· Weight lifting. °· Excessive lower back curvature. °· Forward-tilted pelvis. °· Weak back or abdominal muscles or both. °· Tight hamstrings. °SIGNS AND SYMPTOMS  °Lumbosacral strain may cause pain in the area of your injury or pain that moves (radiates) down your leg.  °DIAGNOSIS °Your health care provider can often diagnose lumbosacral strain through a physical exam. In some cases, you may need tests such as X-ray exams.  °TREATMENT  °Treatment for your lower   back injury depends on many factors that your clinician will have to evaluate. However, most treatment will include the use of anti-inflammatory medicines. °HOME CARE INSTRUCTIONS  °· Avoid hard physical activities (tennis, racquetball, waterskiing) if you are not in proper physical condition for it. This may aggravate or create problems. °· If you have a back problem, avoid sports requiring sudden body movements. Swimming and walking are generally safer activities. °· Maintain good posture. °· Maintain a healthy weight. °· For acute conditions, you may put ice on the injured area. °¨ Put ice in a plastic bag. °¨ Place a towel between your skin and the bag. °¨ Leave the ice on for 20 minutes, 2-3 times a day. °· When the low back starts healing, stretching and strengthening exercises may be recommended. °SEEK MEDICAL CARE IF: °· Your back pain is getting worse. °· You experience severe back pain not  relieved with medicines. °SEEK IMMEDIATE MEDICAL CARE IF:  °· You have numbness, tingling, weakness, or problems with the use of your arms or legs. °· There is a change in bowel or bladder control. °· You have increasing pain in any area of the body, including your belly (abdomen). °· You notice shortness of breath, dizziness, or feel faint. °· You feel sick to your stomach (nauseous), are throwing up (vomiting), or become sweaty. °· You notice discoloration of your toes or legs, or your feet get very cold. °MAKE SURE YOU:  °· Understand these instructions. °· Will watch your condition. °· Will get help right away if you are not doing well or get worse. °Document Released: 04/23/2005 Document Revised: 07/19/2013 Document Reviewed: 03/02/2013 °Exit[employer]® Patient Information ©2015 [employer]. This information is not intended to replace advice given to you by your health care provider. Make sure you discuss any questions you have with your health care provider. ° °

## 2014-02-05 NOTE — ED Notes (Signed)
#  20 g [facility] for CT and left in place for return to ED
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
## 2014-02-10 DIAGNOSIS — I10 Essential (primary) hypertension: Secondary | ICD-10-CM | POA: Diagnosis not present

## 2014-02-10 DIAGNOSIS — M542 Cervicalgia: Secondary | ICD-10-CM | POA: Diagnosis not present

## 2014-02-10 DIAGNOSIS — T148XXA Other injury of unspecified body region, initial encounter: Secondary | ICD-10-CM | POA: Diagnosis not present

## 2014-05-15 DIAGNOSIS — Z0000 Encounter for general adult medical examination without abnormal findings: Secondary | ICD-10-CM | POA: Diagnosis not present

## 2014-05-15 DIAGNOSIS — I10 Essential (primary) hypertension: Secondary | ICD-10-CM | POA: Diagnosis not present

## 2014-05-15 DIAGNOSIS — I351 Nonrheumatic aortic (valve) insufficiency: Secondary | ICD-10-CM | POA: Diagnosis not present

## 2014-05-15 DIAGNOSIS — Z79899 Other long term (current) drug therapy: Secondary | ICD-10-CM | POA: Diagnosis not present

## 2014-05-15 DIAGNOSIS — Z1389 Encounter for screening for other disorder: Secondary | ICD-10-CM | POA: Diagnosis not present

## 2014-05-15 DIAGNOSIS — I2510 Atherosclerotic heart disease of native coronary artery without angina pectoris: Secondary | ICD-10-CM | POA: Diagnosis not present

## 2014-05-15 DIAGNOSIS — E782 Mixed hyperlipidemia: Secondary | ICD-10-CM | POA: Diagnosis not present

## 2014-05-17 DIAGNOSIS — Z23 Encounter for immunization: Secondary | ICD-10-CM | POA: Diagnosis not present

## 2014-06-06 DIAGNOSIS — H3532 Exudative age-related macular degeneration: Secondary | ICD-10-CM | POA: Diagnosis not present

## 2014-06-06 DIAGNOSIS — H35351 Cystoid macular degeneration, right eye: Secondary | ICD-10-CM | POA: Diagnosis not present

## 2014-09-21 DIAGNOSIS — I129 Hypertensive chronic kidney disease with stage 1 through stage 4 chronic kidney disease, or unspecified chronic kidney disease: Secondary | ICD-10-CM | POA: Diagnosis not present

## 2014-09-21 DIAGNOSIS — Z79899 Other long term (current) drug therapy: Secondary | ICD-10-CM | POA: Diagnosis not present

## 2014-09-21 DIAGNOSIS — R0600 Dyspnea, unspecified: Secondary | ICD-10-CM | POA: Diagnosis not present

## 2014-09-21 DIAGNOSIS — R21 Rash and other nonspecific skin eruption: Secondary | ICD-10-CM | POA: Diagnosis not present

## 2014-09-21 DIAGNOSIS — N183 Chronic kidney disease, stage 3 (moderate): Secondary | ICD-10-CM | POA: Diagnosis not present

## 2014-11-28 DIAGNOSIS — H3532 Exudative age-related macular degeneration: Secondary | ICD-10-CM | POA: Diagnosis not present

## 2015-01-02 DIAGNOSIS — H3532 Exudative age-related macular degeneration: Secondary | ICD-10-CM | POA: Diagnosis not present

## 2015-01-17 DIAGNOSIS — I129 Hypertensive chronic kidney disease with stage 1 through stage 4 chronic kidney disease, or unspecified chronic kidney disease: Secondary | ICD-10-CM | POA: Diagnosis not present

## 2015-02-06 DIAGNOSIS — H3532 Exudative age-related macular degeneration: Secondary | ICD-10-CM | POA: Diagnosis not present

## 2015-03-22 DIAGNOSIS — H40013 Open angle with borderline findings, low risk, bilateral: Secondary | ICD-10-CM | POA: Diagnosis not present

## 2015-03-22 DIAGNOSIS — H3531 Nonexudative age-related macular degeneration: Secondary | ICD-10-CM | POA: Diagnosis not present

## 2015-03-22 DIAGNOSIS — H3532 Exudative age-related macular degeneration: Secondary | ICD-10-CM | POA: Diagnosis not present

## 2015-03-22 DIAGNOSIS — Z961 Presence of intraocular lens: Secondary | ICD-10-CM | POA: Diagnosis not present

## 2015-03-27 DIAGNOSIS — H3532 Exudative age-related macular degeneration: Secondary | ICD-10-CM | POA: Diagnosis not present

## 2015-03-29 DIAGNOSIS — H3532 Exudative age-related macular degeneration: Secondary | ICD-10-CM | POA: Diagnosis not present

## 2015-03-29 DIAGNOSIS — H4011X0 Primary open-angle glaucoma, stage unspecified: Secondary | ICD-10-CM | POA: Diagnosis not present

## 2015-04-03 DIAGNOSIS — H40011 Open angle with borderline findings, low risk, right eye: Secondary | ICD-10-CM | POA: Diagnosis not present

## 2015-04-03 DIAGNOSIS — H40051 Ocular hypertension, right eye: Secondary | ICD-10-CM | POA: Diagnosis not present

## 2015-04-10 DIAGNOSIS — H2000 Unspecified acute and subacute iridocyclitis: Secondary | ICD-10-CM | POA: Diagnosis not present

## 2015-04-10 DIAGNOSIS — H40051 Ocular hypertension, right eye: Secondary | ICD-10-CM | POA: Diagnosis not present

## 2015-04-16 DIAGNOSIS — H2000 Unspecified acute and subacute iridocyclitis: Secondary | ICD-10-CM | POA: Diagnosis not present

## 2015-04-16 DIAGNOSIS — H40051 Ocular hypertension, right eye: Secondary | ICD-10-CM | POA: Diagnosis not present

## 2015-05-03 DIAGNOSIS — H353211 Exudative age-related macular degeneration, right eye, with active choroidal neovascularization: Secondary | ICD-10-CM | POA: Diagnosis not present

## 2015-05-08 DIAGNOSIS — M542 Cervicalgia: Secondary | ICD-10-CM | POA: Diagnosis not present

## 2015-05-08 DIAGNOSIS — M25512 Pain in left shoulder: Secondary | ICD-10-CM | POA: Diagnosis not present

## 2015-05-10 DIAGNOSIS — H40012 Open angle with borderline findings, low risk, left eye: Secondary | ICD-10-CM | POA: Diagnosis not present

## 2015-05-10 DIAGNOSIS — H40052 Ocular hypertension, left eye: Secondary | ICD-10-CM | POA: Diagnosis not present

## 2015-05-10 DIAGNOSIS — H40013 Open angle with borderline findings, low risk, bilateral: Secondary | ICD-10-CM | POA: Diagnosis not present

## 2015-05-15 DIAGNOSIS — Z23 Encounter for immunization: Secondary | ICD-10-CM | POA: Diagnosis not present

## 2015-05-20 ENCOUNTER — Encounter (HOSPITAL_COMMUNITY): Payer: Self-pay | Admitting: *Deleted

## 2015-05-20 ENCOUNTER — Emergency Department (HOSPITAL_COMMUNITY)
Admission: EM | Admit: 2015-05-20 | Discharge: 2015-05-21 | Disposition: A | Discharge status: Home / Self Care | Payer: Medicare Other | Attending: Emergency Medicine | Admitting: Emergency Medicine

## 2015-05-20 ENCOUNTER — Emergency Department (HOSPITAL_COMMUNITY): Payer: Medicare Other

## 2015-05-20 DIAGNOSIS — S12000A Unspecified displaced fracture of first cervical vertebra, initial encounter for closed fracture: Secondary | ICD-10-CM | POA: Insufficient documentation

## 2015-05-20 DIAGNOSIS — S0990XA Unspecified injury of head, initial encounter: Secondary | ICD-10-CM

## 2015-05-20 DIAGNOSIS — S0993XA Unspecified injury of face, initial encounter: Secondary | ICD-10-CM | POA: Diagnosis not present

## 2015-05-20 DIAGNOSIS — S199XXA Unspecified injury of neck, initial encounter: Secondary | ICD-10-CM | POA: Diagnosis present

## 2015-05-20 DIAGNOSIS — Y998 Other external cause status: Secondary | ICD-10-CM | POA: Diagnosis not present

## 2015-05-20 DIAGNOSIS — W19XXXA Unspecified fall, initial encounter: Secondary | ICD-10-CM

## 2015-05-20 DIAGNOSIS — Z951 Presence of aortocoronary bypass graft: Secondary | ICD-10-CM | POA: Diagnosis not present

## 2015-05-20 DIAGNOSIS — Z79899 Other long term (current) drug therapy: Secondary | ICD-10-CM | POA: Insufficient documentation

## 2015-05-20 DIAGNOSIS — I2510 Atherosclerotic heart disease of native coronary artery without angina pectoris: Secondary | ICD-10-CM | POA: Diagnosis not present

## 2015-05-20 DIAGNOSIS — Y9389 Activity, other specified: Secondary | ICD-10-CM | POA: Insufficient documentation

## 2015-05-20 DIAGNOSIS — Y9289 Other specified places as the place of occurrence of the external cause: Secondary | ICD-10-CM | POA: Insufficient documentation

## 2015-05-20 DIAGNOSIS — W01198A Fall on same level from slipping, tripping and stumbling with subsequent striking against other object, initial encounter: Secondary | ICD-10-CM | POA: Insufficient documentation

## 2015-05-20 DIAGNOSIS — Z7982 Long term (current) use of aspirin: Secondary | ICD-10-CM | POA: Insufficient documentation

## 2015-05-20 DIAGNOSIS — E785 Hyperlipidemia, unspecified: Secondary | ICD-10-CM | POA: Insufficient documentation

## 2015-05-20 DIAGNOSIS — S0031XA Abrasion of nose, initial encounter: Secondary | ICD-10-CM | POA: Diagnosis not present

## 2015-05-20 DIAGNOSIS — Z8739 Personal history of other diseases of the musculoskeletal system and connective tissue: Secondary | ICD-10-CM | POA: Diagnosis not present

## 2015-05-20 DIAGNOSIS — I10 Essential (primary) hypertension: Secondary | ICD-10-CM | POA: Insufficient documentation

## 2015-05-20 DIAGNOSIS — S0033XA Contusion of nose, initial encounter: Secondary | ICD-10-CM | POA: Insufficient documentation

## 2015-05-20 DIAGNOSIS — S129XXA Fracture of neck, unspecified, initial encounter: Secondary | ICD-10-CM

## 2015-05-20 MED ORDER — TRAMADOL HCL 50 MG PO TABS
50.0000 mg | ORAL_TABLET | Freq: Once | ORAL | Status: AC
  Administered 2015-05-20: 50 mg via ORAL
  Filled 2015-05-20: qty 1

## 2015-05-20 NOTE — ED Notes (Signed)
Pt placed in soft cervical collar per nurse until CT results. Pt comfortable.

## 2015-05-20 NOTE — ED Notes (Signed)
Dr. Glick at bedside.  

## 2015-05-20 NOTE — ED Provider Notes (Addendum)
CSN: [id number]     Arrival date & time 05/20/15  1857 History   First MD Initiated Contact with Patient 05/20/15 2100     Chief Complaint  Patient presents with  . Neck Pain     (Consider location/radiation/quality/duration/timing/severity/associated sxs/prior Treatment) Patient is a 81 y.o. female presenting with neck pain. The history is provided by the patient and a relative.  Neck Pain Associated symptoms: no chest pain, no fever, no headaches, no numbness and no weakness    patient with a fall yesterday. Patient normally walks with a walker. Patient fell forward striking her face. Patient with complaint of neck pain. And a bruise to her nose. Patient's been up walking with her walker since then without any difficulties. No other complaints. Patient is not on blood thinners.  Past Medical History  Diagnosis Date  . Chest pain   . SOB (shortness of breath)   . Coronary artery disease   . Hyperlipidemia   . Hypertension   . Aortic insufficiency     MILD TO MODERATE  . PVC's (premature ventricular contractions)   . Dizziness   . Palpitations   . Spinal stenosis    Past Surgical History  Procedure Laterality Date  . Cardiac catheterization  01/12/1996    THE EF IS DIFFICULT TO ESTIMATE DUE TO PVCS. SEVERE TWO-VESSEL OBSTRUCTIVE ATHERSCLEROTIC CAD WITH COMPLEX STENOSIS IN THE LAD AND DIAGONAL VESSELS  . Cardiac catheterization  07/29/1991    EF 80-85%  . Coronary artery bypass graft  1997  . Tonsillectomy and adenoidectomy    . Appendectomy    . Ovarian cyst removal    . Inguinal hernia repair    . Abdominal hysterectomy     Family History  Problem Relation Age of Onset  . Heart disease Mother   . Heart disease Father   . Nephritis Brother    Social History  Substance Use Topics  . Smoking status: Never Smoker   . Smokeless tobacco: Never Used  . Alcohol Use: No   OB History    No data available     Review of Systems  Constitutional: Negative for fever.   HENT: Positive for facial swelling.   Eyes: Negative for visual disturbance.  Respiratory: Negative for shortness of breath.   Cardiovascular: Negative for chest pain.  Gastrointestinal: Negative for abdominal pain.  Genitourinary: Negative for dysuria.  Musculoskeletal: Positive for neck pain. Negative for back pain.  Skin: Positive for wound.  Neurological: Negative for weakness, numbness and headaches.  Psychiatric/Behavioral: Negative for confusion.      Allergies  Cafergot; Cephalexin; Clinoril; Codeine; Dolobid; Equagesic; Flu virus vaccine; Hyzaar; Neosporin; Other; Pce; Prednisone; and Tetracyclines & related  Home Medications   Prior to Admission medications   Medication Sig Start Date End Date Taking? Authorizing Provider  acetaminophen (TYLENOL) 500 MG tablet Take 500 mg by mouth 2 (two) times daily.   Yes Historical Provider, MD  amLODipine (NORVASC) 5 MG tablet Take 5 mg by mouth daily.     Yes Historical Provider, MD  aspirin 81 MG tablet Take 81 mg by mouth daily.     Yes Historical Provider, MD  Calcium Carbonate-Vit D-Min (CALTRATE 600+D PLUS) 600-400 MG-UNIT per tablet Take 1 tablet by mouth daily.     Yes Historical Provider, MD  colesevelam (WELCHOL) 625 MG tablet Take 625 mg by mouth 2 (two) times daily with a meal.     Yes Historical Provider, MD  fish oil-omega-3 fatty acids 1000 MG capsule Take 2  g by mouth daily.    Yes Historical Provider, MD  isosorbide mononitrate (IMDUR) 60 MG 24 hr tablet Take 1 tablet (60 mg total) by mouth every morning. 02/07/11 05/20/15 Yes Peter M [place], MD  Multiple Vitamin (MULTIVITAMIN WITH MINERALS) TABS Take 1 tablet by mouth daily.   Yes Historical Provider, MD  multivitamin-lutein (OCUVITE-LUTEIN) CAPS Take 1 capsule by mouth 2 (two) times daily.   Yes Historical Provider, MD  nitroGLYCERIN (NITROSTAT) 0.4 MG SL tablet Place 0.4 mg under the tongue every 5 (five) minutes as needed. For chest pain   Yes Historical Provider, MD   Nutritional Supplements (ARTHRO-COMPLEX PO) Take 1 tablet by mouth 2 (two) times daily.   Yes Historical Provider, MD  omeprazole (PRILOSEC) 20 MG capsule Take 20 mg by mouth daily.    Yes Historical Provider, MD  traMADol (ULTRAM) 50 MG tablet Take 50 mg by mouth every 6 (six) hours as needed for severe pain.   Yes Historical Provider, MD  valsartan-hydrochlorothiazide (DIOVAN-HCT) 160-12.5 MG per tablet Take 0.5 tablets by mouth daily.    Yes Historical Provider, MD  vitamin C (ASCORBIC ACID) 500 MG tablet Take 500 mg by mouth daily.    Yes Historical Provider, MD   BP 163/59 mmHg  Pulse 64  Temp(Src) 97 F (36.1 C) (Oral)  Resp 16  SpO2 97% Physical Exam  Constitutional: She appears well-developed and well-nourished. No distress.  HENT:  Mouth/Throat: Oropharynx is clear and moist.  Anterior nose was swelling and contusion and superficial abrasion.  Eyes: Conjunctivae and EOM are normal. Pupils are equal, round, and reactive to light.  Neck: Normal range of motion.  Possible tenderness to palpation posteriorly. Along the midline.  Cardiovascular: Normal rate, regular rhythm and normal heart sounds.   No murmur heard. Pulmonary/Chest: Effort normal and breath sounds normal. No respiratory distress.  Abdominal: Soft. Bowel sounds are normal. There is no tenderness.  Musculoskeletal: Normal range of motion. She exhibits no edema or tenderness.  Neurological: She is alert. No cranial nerve deficit. She exhibits normal muscle tone. Coordination normal.  Skin: Skin is warm.  Nursing note and vitals reviewed.   ED Course  Procedures (including critical care time) Labs Review Labs Reviewed - No data to display  Imaging Review Ct Head Wo Contrast  05/20/2015  CLINICAL DATA:  Status post fall. Hit face, with neck pain and nasal bruising. Concern for head injury. Initial encounter. EXAM: CT HEAD WITHOUT CONTRAST CT MAXILLOFACIAL WITHOUT CONTRAST CT CERVICAL SPINE WITHOUT CONTRAST  TECHNIQUE: Multidetector CT imaging of the head, cervical spine, and maxillofacial structures were performed using the standard protocol without intravenous contrast. Multiplanar CT image reconstructions of the cervical spine and maxillofacial structures were also generated. COMPARISON:  CT of the head and cervical spine performed 02/05/2014 FINDINGS: CT HEAD FINDINGS There is no evidence of acute infarction, mass lesion, or intra- or extra-axial hemorrhage on CT. Scattered periventricular white matter change likely reflects small vessel ischemic microangiopathy. Chronic lacunar infarcts are seen at the basal ganglia bilaterally. The posterior fossa, including the cerebellum, brainstem and fourth ventricle, is within normal limits. The third and lateral ventricles, and basal ganglia are unremarkable in appearance. The cerebral hemispheres are symmetric in appearance, with normal gray-white differentiation. No mass effect or midline shift is seen. The fracture through C1 is better characterized on concurrent cervical spine CT. The orbits are within normal limits. The paranasal sinuses and mastoid air cells are well-aerated. No significant soft tissue abnormalities are seen. CT MAXILLOFACIAL FINDINGS There  is no evidence of fracture or dislocation. The maxilla and mandible appear intact. The nasal bone is unremarkable in appearance. The visualized dentition demonstrates no acute abnormality. Degenerative flattening is noted at the right mandibular condylar head. The orbits are intact bilaterally. The visualized paranasal sinuses and mastoid air cells are well-aerated. Nasal bruising is not well characterized on CT. The parapharyngeal fat planes are preserved. The nasopharynx, oropharynx and hypopharynx are unremarkable in appearance. The visualized portions of the valleculae and piriform sinuses are grossly unremarkable. The parotid and submandibular glands are within normal limits. No cervical lymphadenopathy is  seen. CT CERVICAL SPINE FINDINGS There is a mildly displaced fracture through the anterior arch and left posterior arch of C1. A large amount of relatively high attenuation partially calcified material is noted posterior to the dens at the level of the fracture. This is stable from 2015, measures 2.6 x 1.6 x 2.2 cm and is thought to reflect inflammatory pannus, given associated erosion of the dens. There is corresponding narrowing of the spinal canal to 7 mm on sagittal images. Multilevel disc space narrowing is noted along the cervical upper thoracic spine, with minimal grade 1 anterolisthesis of C3 on C4, and grade 1 anterolisthesis of C 7 on T1. An associated tiny fragment involving the right superior facet at T1 appears to be unchanged from prior studies. Prevertebral soft tissues are within normal limits. The thyroid gland is unremarkable in appearance. Mild pleural calcification is noted at the lung apices. Mild calcification is noted at the carotid bifurcations bilaterally. IMPRESSION: 1. No evidence of traumatic intracranial injury. 2. Mildly displaced acute fracture through the anterior arch and left posterior arch of C1. 3. No evidence fracture or dislocation with regard to the maxillofacial structures. 4. Large amount of inflammatory pannus noted posterior to the dens, measuring 2.6 x 1.6 x 2.2 cm, with narrowing the spinal canal to 7 mm in sagittal images. This is relatively stable in appearance, with associated erosion of the posterior dens. 5. Degenerative change at the right temporomandibular joint. 6. Scattered small vessel ischemic microangiopathy, and chronic lacunar infarcts at the basal ganglia bilaterally. 7. Mild diffuse degenerative change along the cervical spine. 8. Mild pleural calcification at the lung apices. 9. Mild calcification at the carotid bifurcations bilaterally. Carotid ultrasound could be considered for further evaluation, when and as deemed clinically appropriate. These results  were called by telephone at the time of interpretation on 05/20/2015 at 10:42 pm to Dr. Vanetta Mulders, who verbally acknowledged these results. Electronically Signed   By: Roanna Raider M.D.   On: 05/20/2015 22:44   Ct Cervical Spine Wo Contrast  05/20/2015  CLINICAL DATA:  Status post fall. Hit face, with neck pain and nasal bruising. Concern for head injury. Initial encounter. EXAM: CT HEAD WITHOUT CONTRAST CT MAXILLOFACIAL WITHOUT CONTRAST CT CERVICAL SPINE WITHOUT CONTRAST TECHNIQUE: Multidetector CT imaging of the head, cervical spine, and maxillofacial structures were performed using the standard protocol without intravenous contrast. Multiplanar CT image reconstructions of the cervical spine and maxillofacial structures were also generated. COMPARISON:  CT of the head and cervical spine performed 02/05/2014 FINDINGS: CT HEAD FINDINGS There is no evidence of acute infarction, mass lesion, or intra- or extra-axial hemorrhage on CT. Scattered periventricular white matter change likely reflects small vessel ischemic microangiopathy. Chronic lacunar infarcts are seen at the basal ganglia bilaterally. The posterior fossa, including the cerebellum, brainstem and fourth ventricle, is within normal limits. The third and lateral ventricles, and basal ganglia are unremarkable in appearance. The  cerebral hemispheres are symmetric in appearance, with normal gray-white differentiation. No mass effect or midline shift is seen. The fracture through C1 is better characterized on concurrent cervical spine CT. The orbits are within normal limits. The paranasal sinuses and mastoid air cells are well-aerated. No significant soft tissue abnormalities are seen. CT MAXILLOFACIAL FINDINGS There is no evidence of fracture or dislocation. The maxilla and mandible appear intact. The nasal bone is unremarkable in appearance. The visualized dentition demonstrates no acute abnormality. Degenerative flattening is noted at the right  mandibular condylar head. The orbits are intact bilaterally. The visualized paranasal sinuses and mastoid air cells are well-aerated. Nasal bruising is not well characterized on CT. The parapharyngeal fat planes are preserved. The nasopharynx, oropharynx and hypopharynx are unremarkable in appearance. The visualized portions of the valleculae and piriform sinuses are grossly unremarkable. The parotid and submandibular glands are within normal limits. No cervical lymphadenopathy is seen. CT CERVICAL SPINE FINDINGS There is a mildly displaced fracture through the anterior arch and left posterior arch of C1. A large amount of relatively high attenuation partially calcified material is noted posterior to the dens at the level of the fracture. This is stable from 2015, measures 2.6 x 1.6 x 2.2 cm and is thought to reflect inflammatory pannus, given associated erosion of the dens. There is corresponding narrowing of the spinal canal to 7 mm on sagittal images. Multilevel disc space narrowing is noted along the cervical upper thoracic spine, with minimal grade 1 anterolisthesis of C3 on C4, and grade 1 anterolisthesis of C 7 on T1. An associated tiny fragment involving the right superior facet at T1 appears to be unchanged from prior studies. Prevertebral soft tissues are within normal limits. The thyroid gland is unremarkable in appearance. Mild pleural calcification is noted at the lung apices. Mild calcification is noted at the carotid bifurcations bilaterally. IMPRESSION: 1. No evidence of traumatic intracranial injury. 2. Mildly displaced acute fracture through the anterior arch and left posterior arch of C1. 3. No evidence fracture or dislocation with regard to the maxillofacial structures. 4. Large amount of inflammatory pannus noted posterior to the dens, measuring 2.6 x 1.6 x 2.2 cm, with narrowing the spinal canal to 7 mm in sagittal images. This is relatively stable in appearance, with associated erosion of the  posterior dens. 5. Degenerative change at the right temporomandibular joint. 6. Scattered small vessel ischemic microangiopathy, and chronic lacunar infarcts at the basal ganglia bilaterally. 7. Mild diffuse degenerative change along the cervical spine. 8. Mild pleural calcification at the lung apices. 9. Mild calcification at the carotid bifurcations bilaterally. Carotid ultrasound could be considered for further evaluation, when and as deemed clinically appropriate. These results were called by telephone at the time of interpretation on 05/20/2015 at 10:42 pm to Dr. Vanetta Mulders, who verbally acknowledged these results. Electronically Signed   By: Roanna Raider M.D.   On: 05/20/2015 22:44   Ct Maxillofacial Wo Cm  05/20/2015  CLINICAL DATA:  Status post fall. Hit face, with neck pain and nasal bruising. Concern for head injury. Initial encounter. EXAM: CT HEAD WITHOUT CONTRAST CT MAXILLOFACIAL WITHOUT CONTRAST CT CERVICAL SPINE WITHOUT CONTRAST TECHNIQUE: Multidetector CT imaging of the head, cervical spine, and maxillofacial structures were performed using the standard protocol without intravenous contrast. Multiplanar CT image reconstructions of the cervical spine and maxillofacial structures were also generated. COMPARISON:  CT of the head and cervical spine performed 02/05/2014 FINDINGS: CT HEAD FINDINGS There is no evidence of acute infarction, mass lesion,  or intra- or extra-axial hemorrhage on CT. Scattered periventricular white matter change likely reflects small vessel ischemic microangiopathy. Chronic lacunar infarcts are seen at the basal ganglia bilaterally. The posterior fossa, including the cerebellum, brainstem and fourth ventricle, is within normal limits. The third and lateral ventricles, and basal ganglia are unremarkable in appearance. The cerebral hemispheres are symmetric in appearance, with normal gray-white differentiation. No mass effect or midline shift is seen. The fracture  through C1 is better characterized on concurrent cervical spine CT. The orbits are within normal limits. The paranasal sinuses and mastoid air cells are well-aerated. No significant soft tissue abnormalities are seen. CT MAXILLOFACIAL FINDINGS There is no evidence of fracture or dislocation. The maxilla and mandible appear intact. The nasal bone is unremarkable in appearance. The visualized dentition demonstrates no acute abnormality. Degenerative flattening is noted at the right mandibular condylar head. The orbits are intact bilaterally. The visualized paranasal sinuses and mastoid air cells are well-aerated. Nasal bruising is not well characterized on CT. The parapharyngeal fat planes are preserved. The nasopharynx, oropharynx and hypopharynx are unremarkable in appearance. The visualized portions of the valleculae and piriform sinuses are grossly unremarkable. The parotid and submandibular glands are within normal limits. No cervical lymphadenopathy is seen. CT CERVICAL SPINE FINDINGS There is a mildly displaced fracture through the anterior arch and left posterior arch of C1. A large amount of relatively high attenuation partially calcified material is noted posterior to the dens at the level of the fracture. This is stable from 2015, measures 2.6 x 1.6 x 2.2 cm and is thought to reflect inflammatory pannus, given associated erosion of the dens. There is corresponding narrowing of the spinal canal to 7 mm on sagittal images. Multilevel disc space narrowing is noted along the cervical upper thoracic spine, with minimal grade 1 anterolisthesis of C3 on C4, and grade 1 anterolisthesis of C 7 on T1. An associated tiny fragment involving the right superior facet at T1 appears to be unchanged from prior studies. Prevertebral soft tissues are within normal limits. The thyroid gland is unremarkable in appearance. Mild pleural calcification is noted at the lung apices. Mild calcification is noted at the carotid  bifurcations bilaterally. IMPRESSION: 1. No evidence of traumatic intracranial injury. 2. Mildly displaced acute fracture through the anterior arch and left posterior arch of C1. 3. No evidence fracture or dislocation with regard to the maxillofacial structures. 4. Large amount of inflammatory pannus noted posterior to the dens, measuring 2.6 x 1.6 x 2.2 cm, with narrowing the spinal canal to 7 mm in sagittal images. This is relatively stable in appearance, with associated erosion of the posterior dens. 5. Degenerative change at the right temporomandibular joint. 6. Scattered small vessel ischemic microangiopathy, and chronic lacunar infarcts at the basal ganglia bilaterally. 7. Mild diffuse degenerative change along the cervical spine. 8. Mild pleural calcification at the lung apices. 9. Mild calcification at the carotid bifurcations bilaterally. Carotid ultrasound could be considered for further evaluation, when and as deemed clinically appropriate. These results were called by telephone at the time of interpretation on 05/20/2015 at 10:42 pm to Dr. Vanetta MuldersSCOTT Josef Tourigny, who verbally acknowledged these results. Electronically Signed   By: Roanna RaiderJeffery  Chang M.D.   On: 05/20/2015 22:44   I have personally reviewed and evaluated these images and lab results as part of my medical decision-making.   EKG Interpretation None      MDM   Final diagnoses:  Fall, initial encounter  Head injury, initial encounter  Cervical compression fracture,  initial encounter [facility])   Patient status post fall yesterday. Patient with complaint of neck pain. CT scan shows evidence of an acute minimally displaced C1 arch fracture. Reviewed by neurosurgery patient cleared to go home on a soft collar until follow-up in the office. Patient will follow-up with Dr. Jordan Likes.  No evidence of any facial fractures. No intracranial injury no skull fracture. No other significant injuries. Patient has been ambulating with her walker since the fall  that occurred yesterday. Patient without any focal neuro deficits. That are acute.    Vanetta Mulders, MD 05/20/15 [id number]  Vanetta Mulders, MD 05/20/15 [phone]

## 2015-05-20 NOTE — ED Notes (Signed)
The pt is c/o neck pain after she fell yesterday but she does not want us to know that she fell.  She also does not want us to know her weight.

## 2015-05-20 NOTE — ED Notes (Signed)
MD at bedside. 

## 2015-05-20 NOTE — Discharge Instructions (Signed)
Wear the soft collar at all times. Make an appointment to follow-up with neurosurgery. Phone number and address provided above. Take the tramadol as needed for pain.

## 2015-05-24 DIAGNOSIS — S12090A Other displaced fracture of first cervical vertebra, initial encounter for closed fracture: Secondary | ICD-10-CM | POA: Diagnosis not present

## 2015-06-07 DIAGNOSIS — H353221 Exudative age-related macular degeneration, left eye, with active choroidal neovascularization: Secondary | ICD-10-CM | POA: Diagnosis not present

## 2015-06-13 DIAGNOSIS — H353211 Exudative age-related macular degeneration, right eye, with active choroidal neovascularization: Secondary | ICD-10-CM | POA: Diagnosis not present

## 2015-06-27 DIAGNOSIS — S12090A Other displaced fracture of first cervical vertebra, initial encounter for closed fracture: Secondary | ICD-10-CM | POA: Diagnosis not present

## 2015-06-27 DIAGNOSIS — I10 Essential (primary) hypertension: Secondary | ICD-10-CM | POA: Diagnosis not present

## 2015-07-05 DIAGNOSIS — H40053 Ocular hypertension, bilateral: Secondary | ICD-10-CM | POA: Diagnosis not present

## 2015-07-05 DIAGNOSIS — H401131 Primary open-angle glaucoma, bilateral, mild stage: Secondary | ICD-10-CM | POA: Diagnosis not present

## 2015-07-19 DIAGNOSIS — H353211 Exudative age-related macular degeneration, right eye, with active choroidal neovascularization: Secondary | ICD-10-CM | POA: Diagnosis not present

## 2016-01-02 ENCOUNTER — Other Ambulatory Visit: Payer: Self-pay | Admitting: Geriatric Medicine

## 2016-01-02 ENCOUNTER — Ambulatory Visit
Admission: RE | Admit: 2016-01-02 | Discharge: 2016-01-02 | Disposition: A | Discharge status: Home / Self Care | Payer: Medicare Other | Source: Ambulatory Visit | Attending: Geriatric Medicine | Admitting: Geriatric Medicine

## 2016-01-02 DIAGNOSIS — M542 Cervicalgia: Secondary | ICD-10-CM

## 2016-01-23 ENCOUNTER — Ambulatory Visit: Payer: Medicare Other | Admitting: Physical Therapy

## 2016-02-11 ENCOUNTER — Encounter: Payer: Self-pay | Admitting: Physical Therapy

## 2016-02-11 ENCOUNTER — Ambulatory Visit: Payer: Medicare Other | Attending: Geriatric Medicine | Admitting: Physical Therapy

## 2016-02-11 DIAGNOSIS — M542 Cervicalgia: Secondary | ICD-10-CM | POA: Diagnosis present

## 2016-02-11 DIAGNOSIS — R252 Cramp and spasm: Secondary | ICD-10-CM

## 2016-02-11 NOTE — Therapy (Signed)
[facility] [address] [place], [place], [address] Phone: [phone]   Fax:  [phone]  Physical Therapy Evaluation  Patient Details  Name: Jordan ChimeLouise W Berkery MRN: [id number] Date of Birth: July 05, 1916 Referring Provider: Merlene LaughterHal Stoneking  Encounter Date: 02/11/2016      PT End of Session - 02/11/16 1135    Visit Number 1   Date for PT Re-Evaluation 04/13/16   PT Start Time 1100   PT Stop Time 1152   PT Time Calculation (min) 52 min   Activity Tolerance Patient tolerated treatment well   Behavior [facility] Therapy [facility] for tasks assessed/performed      Past Medical History  Diagnosis Date  . Chest pain   . SOB (shortness of breath)   . Coronary artery disease   . Hyperlipidemia   . Hypertension   . Aortic insufficiency     MILD TO MODERATE  . PVC's (premature ventricular contractions)   . Dizziness   . Palpitations   . Spinal stenosis     Past Surgical History  Procedure Laterality Date  . Cardiac catheterization  01/12/1996    THE EF IS DIFFICULT TO ESTIMATE DUE TO PVCS. SEVERE TWO-VESSEL OBSTRUCTIVE ATHERSCLEROTIC CAD WITH COMPLEX STENOSIS IN THE LAD AND DIAGONAL VESSELS  . Cardiac catheterization  07/29/1991    EF 80-85%  . Coronary artery bypass graft  1997  . Tonsillectomy and adenoidectomy    . Appendectomy    . Ovarian cyst removal    . Inguinal hernia repair    . Abdominal hysterectomy      There were no vitals filed for this visit.       Subjective Assessment - 02/11/16 1059    Subjective Patient reports that she has been having neck pain since she fell last fall.  She reports that it is mostly in the left upper trap area, into the shoulder and the head.  Worse with looking up.  She reports that the fall caused a fracture of a vertebrae that she wore a collar for for a few months.   Limitations House hold activities   Patient Stated Goals have less pain and sleep better.   Currently in Pain?  Yes   Pain Score 5    Pain Location Neck   Pain Orientation Left   Pain Descriptors / Indicators Aching;Sore   Pain Type Chronic pain   Pain Onset More than a month ago   Pain Frequency Intermittent   Aggravating Factors  in the morning, head motions, weather changes pain up to 6-7/10   Pain Relieving Factors not moving and just once I get going pain can be a 1/10   Effect of Pain on Daily Activities limits my head turning            [facility] Assessment - 02/11/16 0001    Assessment   Medical Diagnosis neck pain   Referring Provider Hal Stoneking   Onset Date/Surgical Date 01/12/16   Prior Therapy no   Precautions   Precautions None   Balance Screen   Has the patient fallen in the past 6 months No   Has the patient had a decrease in activity level because of a fear of falling?  No   Is the patient reluctant to leave their home because of a fear of falling?  No   Home Environment   Additional Comments lives alone, does housework, does not drive anymore   Prior Function   Level of  Independence Independent   Posture/Postural Control   Posture Comments some fwd head, rounded shoulders   ROM / Strength   AROM / PROM / Strength AROM;Strength   AROM   Overall AROM Comments cervical ROM was decreased 100% for extension, decreased 50% for rotation and side bending, flexion WNL's   Strength   Overall Strength Comments 3+/5 in the shoulders   Flexibility   Soft Tissue Assessment /Muscle Length --  some tightness in the left UE   Palpation   Palpation comment she is tight and tender in the upper traps the rhomboids and worse in the cervical paraspinals                   [facility] Adult PT Treatment/Exercise - 02/11/16 0001    Modalities   Modalities Moist Heat;Electrical Stimulation   Moist Heat Therapy   Number Minutes Moist Heat 15 Minutes   Moist Heat Location Shoulder   Electrical Stimulation   Electrical Stimulation Location left shoulder    Electrical  Stimulation Action IFC   Electrical Stimulation Parameters sitting   Electrical Stimulation Goals Pain                PT Education - 02/11/16 1134    Education provided Yes   Education Details shoulder shrugs, cervical and scapular retractions   Person(s) Educated Patient   Methods Explanation;Demonstration;Handout   Comprehension Verbalized understanding          PT Short Term Goals - 02/11/16 1138    PT SHORT TERM GOAL #1   Title independent with initial HEP   Time 2   Period Weeks   Status New           PT Long Term Goals - 02/11/16 1138    PT LONG TERM GOAL #1   Title increase cerivcal extension 25%   Time 8   Period Weeks   Status New   PT LONG TERM GOAL #2   Title increase cervical rotation 25%   Time 8   Period Weeks   Status New   PT LONG TERM GOAL #3   Title report pain decreased 25%   Time 8   Period Weeks   Status New   PT LONG TERM GOAL #4   Title report 25% less frequency of HA   Time 8   Period Weeks   Status New               Plan - 02/11/16 1136    Clinical Impression Statement Patient reports a fall in October where she sustained a cervical fracture.  She reports that she was in a collar for sometime and still uses one to read, she reports pain in the left upper trap, neck, head and into the shoulder.  She is limited in her cervical ROM, she has spasms in the upper trap and very tender in the cervcial parapsinals   Rehab Potential Good   PT Frequency 1x / week   PT Duration 8 weeks   PT Treatment/Interventions ADLs/Self Care Home Management;Cryotherapy;Electrical Stimulation;Moist Heat;Therapeutic exercise;Therapeutic activities;Ultrasound;Manual techniques   Consulted and Agree with Plan of Care Patient      Patient will benefit from skilled therapeutic intervention in order to improve the following deficits and impairments:  Decreased range of motion, Decreased strength, Increased muscle spasms, Postural dysfunction,  Improper body mechanics, Pain  Visit Diagnosis: Cervicalgia - Plan: PT plan of care cert/re-cert  Cramp and spasm - Plan: PT plan of care cert/re-cert  G-Codes - 02/11/16 1141    Functional Assessment Tool Used foto 58% limitation   Functional Limitation Carrying, moving and handling objects   Carrying, Moving and Handling Objects Current Status [phone]) At least 40 percent but less than 60 percent impaired, limited or restricted   Carrying, Moving and Handling Objects Goal Status ([id number]) At least 40 percent but less than 60 percent impaired, limited or restricted       Problem List Patient Active Problem List   Diagnosis Date Noted  . Chest pain   . SOB (shortness of breath)   . Coronary artery disease   . Hyperlipidemia   . Hypertension   . Aortic insufficiency     Jearld Lesch., PT 02/11/2016, 11:42 AM  [facility] [place] [address] [place], [place], [address] Phone: [phone]   Fax:  [phone]  Name: LILLIEMAE FRUGE MRN: [id number] Date of Birth: 1916/06/29

## 2016-02-20 ENCOUNTER — Ambulatory Visit: Payer: Medicare Other | Admitting: Physical Therapy

## 2016-02-20 ENCOUNTER — Encounter: Payer: Self-pay | Admitting: Physical Therapy

## 2016-02-20 DIAGNOSIS — M542 Cervicalgia: Secondary | ICD-10-CM

## 2016-02-20 DIAGNOSIS — R252 Cramp and spasm: Secondary | ICD-10-CM

## 2016-02-20 NOTE — Therapy (Signed)
[facility] [address] [place], [place], [address] Phone: [phone]   Fax:  [phone]  Physical Therapy Treatment  Patient Details  Name: Jordan [name] MRN: [id number] Date of Birth: Sep 04, 1915 Referring Provider: Merlene Laughter  Encounter Date: 02/20/2016      PT End of Session - 02/20/16 1424    Visit Number 2   Date for PT Re-Evaluation 04/13/16   PT Start Time 1355   PT Stop Time 1444   PT Time Calculation (min) 49 min   Activity Tolerance Patient tolerated treatment well   Behavior During Therapy [facility] for tasks assessed/performed      Past Medical History:  Diagnosis Date  . Aortic insufficiency    MILD TO MODERATE  . Chest pain   . Coronary artery disease   . Dizziness   . Hyperlipidemia   . Hypertension   . Palpitations   . PVC's (premature ventricular contractions)   . SOB (shortness of breath)   . Spinal stenosis     Past Surgical History:  Procedure Laterality Date  . ABDOMINAL HYSTERECTOMY    . APPENDECTOMY    . CARDIAC CATHETERIZATION  01/12/1996   THE EF IS DIFFICULT TO ESTIMATE DUE TO PVCS. SEVERE TWO-VESSEL OBSTRUCTIVE ATHERSCLEROTIC CAD WITH COMPLEX STENOSIS IN THE LAD AND DIAGONAL VESSELS  . CARDIAC CATHETERIZATION  07/29/1991   EF 80-85%  . CORONARY ARTERY BYPASS GRAFT  1997  . INGUINAL HERNIA REPAIR    . OVARIAN CYST REMOVAL    . TONSILLECTOMY AND ADENOIDECTOMY      There were no vitals filed for this visit.      Subjective Assessment - 02/20/16 1357    Subjective Patient reports that she had less pain for two day and then increased pain on Friday, reports that she thinks she is doing the exercises wrong   Currently in Pain? Yes   Pain Score 4    Pain Location Neck   Pain Orientation Left                         [facility] Adult PT Treatment/Exercise - 02/20/16 0001      Modalities   Modalities Electrical Stimulation;Moist Heat;Ultrasound     Moist  Heat Therapy   Number Minutes Moist Heat 15 Minutes   Moist Heat Location Shoulder     Electrical Stimulation   Electrical Stimulation Location left shoulder    Electrical Stimulation Action IFC   Electrical Stimulation Parameters sitting   Electrical Stimulation Goals Pain     Ultrasound   Ultrasound Location left CT and upper trap area   Ultrasound Parameters 100% 1.2 w/cm2   Ultrasound Goals Pain     Manual Therapy   Manual Therapy Soft tissue mobilization   Soft tissue mobilization gentle STM to the left upper trap some into the upper trap and cervical area.                PT Education - 02/20/16 1421    Education provided Yes   Education Details went over the HEP, took away the cervical retractions as they were causing pain and she was having difficulty doing it   Person(s) Educated Patient   Methods Explanation;Demonstration   Comprehension Verbalized understanding          PT Short Term Goals - 02/20/16 1428      PT SHORT TERM GOAL #1   Title independent with initial HEP  Status On-going           PT Long Term Goals - 02/11/16 1138      PT LONG TERM GOAL #1   Title increase cerivcal extension 25%   Time 8   Period Weeks   Status New     PT LONG TERM GOAL #2   Title increase cervical rotation 25%   Time 8   Period Weeks   Status New     PT LONG TERM GOAL #3   Title report pain decreased 25%   Time 8   Period Weeks   Status New     PT LONG TERM GOAL #4   Title report 25% less frequency of HA   Time 8   Period Weeks   Status New               Plan - 02/20/16 1426    Clinical Impression Statement Patient reports good relief of pain for a few days after the last visit.  She has a large and tender knot in the left upper trap.  Had difficulty with exercises   PT Next Visit Plan will try some exercises   Consulted and Agree with Plan of Care Patient      Patient will benefit from skilled therapeutic intervention in  order to improve the following deficits and impairments:     Visit Diagnosis: Cervicalgia  Cramp and spasm     Problem List Patient Active Problem List   Diagnosis Date Noted  . Chest pain   . SOB (shortness of breath)   . Coronary artery disease   . Hyperlipidemia   . Hypertension   . Aortic insufficiency     Jearld Lesch., PT 02/20/2016, 2:29 PM  [facility] [address] [place], [place], [address] Phone: [phone]   Fax:  [phone]  Name: Jordan [name] MRN: [id number] Date of Birth: 1916-04-21

## 2016-02-26 ENCOUNTER — Ambulatory Visit: Payer: Medicare Other | Attending: Geriatric Medicine | Admitting: Physical Therapy

## 2016-02-26 ENCOUNTER — Encounter: Payer: Self-pay | Admitting: Physical Therapy

## 2016-02-26 DIAGNOSIS — M542 Cervicalgia: Secondary | ICD-10-CM | POA: Insufficient documentation

## 2016-02-26 DIAGNOSIS — R252 Cramp and spasm: Secondary | ICD-10-CM | POA: Diagnosis present

## 2016-02-26 NOTE — Therapy (Signed)
[facility] [address] [place], [place], [address] Phone: [phone]   Fax:  [phone]  Physical Therapy Treatment  Patient Details  Name: Jordan [name] MRN: [id number] Date of Birth: 1916-03-30 Referring Provider: Merlene Laughter  Encounter Date: 02/26/2016      PT End of Session - 02/26/16 1420    Visit Number 3   Date for PT Re-Evaluation 04/13/16   PT Start Time 1355   PT Stop Time 1445   PT Time Calculation (min) 50 min   Activity Tolerance Patient tolerated treatment well;Patient limited by pain   Behavior During Therapy [facility] for tasks assessed/performed      Past Medical History:  Diagnosis Date  . Aortic insufficiency    MILD TO MODERATE  . Chest pain   . Coronary artery disease   . Dizziness   . Hyperlipidemia   . Hypertension   . Palpitations   . PVC's (premature ventricular contractions)   . SOB (shortness of breath)   . Spinal stenosis     Past Surgical History:  Procedure Laterality Date  . ABDOMINAL HYSTERECTOMY    . APPENDECTOMY    . CARDIAC CATHETERIZATION  01/12/1996   THE EF IS DIFFICULT TO ESTIMATE DUE TO PVCS. SEVERE TWO-VESSEL OBSTRUCTIVE ATHERSCLEROTIC CAD WITH COMPLEX STENOSIS IN THE LAD AND DIAGONAL VESSELS  . CARDIAC CATHETERIZATION  07/29/1991   EF 80-85%  . CORONARY ARTERY BYPASS GRAFT  1997  . INGUINAL HERNIA REPAIR    . OVARIAN CYST REMOVAL    . TONSILLECTOMY AND ADENOIDECTOMY      There were no vitals filed for this visit.      Subjective Assessment - 02/26/16 1417    Subjective Patient reports that she did not sleep good last night and that she woke up stiff and sore in the neck area, she does report less tingling in the parietal area   Currently in Pain? Yes   Pain Score 4    Pain Location Neck   Pain Orientation Left                         [facility] Adult PT Treatment/Exercise - 02/26/16 0001      Modalities   Modalities Electrical  Stimulation;Moist Heat;Ultrasound     Moist Heat Therapy   Number Minutes Moist Heat 15 Minutes   Moist Heat Location Shoulder     Electrical Stimulation   Electrical Stimulation Location left neck area and into the left upper trap   Electrical Stimulation Action IFC   Electrical Stimulation Parameters sitting   Electrical Stimulation Goals Pain     Ultrasound   Ultrasound Location left CT and upper trap area   Ultrasound Parameters 100% 1.3w/cm2   Ultrasound Goals Pain     Manual Therapy   Manual Therapy Soft tissue mobilization   Soft tissue mobilization gentle STM to the left upper trap some into the upper trap and cervical area.                  PT Short Term Goals - 02/26/16 1423      PT SHORT TERM GOAL #1   Title independent with initial HEP   Status Achieved           PT Long Term Goals - 02/11/16 1138      PT LONG TERM GOAL #1   Title increase cerivcal extension 25%   Time 8   Period Weeks  Status New     PT LONG TERM GOAL #2   Title increase cervical rotation 25%   Time 8   Period Weeks   Status New     PT LONG TERM GOAL #3   Title report pain decreased 25%   Time 8   Period Weeks   Status New     PT LONG TERM GOAL #4   Title report 25% less frequency of HA   Time 8   Period Weeks   Status New               Plan - 02/26/16 1422    Clinical Impression Statement Reports less parietal pain and tingling, she is sore and stiff from sleeping wrong last night   PT Next Visit Plan will try some exercises   Consulted and Agree with Plan of Care Patient      Patient will benefit from skilled therapeutic intervention in order to improve the following deficits and impairments:  Decreased range of motion, Decreased strength, Increased muscle spasms, Postural dysfunction, Improper body mechanics, Pain  Visit Diagnosis: Cervicalgia  Cramp and spasm     Problem List Patient Active Problem List   Diagnosis Date Noted  . Chest  pain   . SOB (shortness of breath)   . Coronary artery disease   . Hyperlipidemia   . Hypertension   . Aortic insufficiency     Jearld Lesch., PT 02/26/2016, 2:24 PM  [facility] [address] [place], [place], [address] Phone: [phone]   Fax:  [phone]  Name: Jordan [name] MRN: [id number] Date of Birth: 07-Aug-1915

## 2016-03-04 ENCOUNTER — Ambulatory Visit: Payer: Medicare Other | Admitting: Physical Therapy

## 2016-03-04 ENCOUNTER — Encounter: Payer: Self-pay | Admitting: Physical Therapy

## 2016-03-04 DIAGNOSIS — R252 Cramp and spasm: Secondary | ICD-10-CM

## 2016-03-04 DIAGNOSIS — M542 Cervicalgia: Secondary | ICD-10-CM | POA: Diagnosis not present

## 2016-03-04 NOTE — Therapy (Signed)
[facility] [place], [place], [address] Phone: [phone]   Fax:  [phone]  Physical Therapy Treatment  Patient Details  Name: Jordan [name] MRN: [id number] Date of Birth: 10-27-1915 Referring Provider: Lajean Manes  Encounter Date: 03/04/2016      PT End of Session - 03/04/16 1514    Visit Number 4   Date for PT Re-Evaluation 04/13/16   PT Start Time 1443   PT Stop Time 1533   PT Time Calculation (min) 50 min   Activity Tolerance Patient tolerated treatment well;Patient limited by pain   Behavior During Therapy [facility] for tasks assessed/performed      Past Medical History:  Diagnosis Date  . Aortic insufficiency    MILD TO MODERATE  . Chest pain   . Coronary artery disease   . Dizziness   . Hyperlipidemia   . Hypertension   . Palpitations   . PVC's (premature ventricular contractions)   . SOB (shortness of breath)   . Spinal stenosis     Past Surgical History:  Procedure Laterality Date  . ABDOMINAL HYSTERECTOMY    . APPENDECTOMY    . CARDIAC CATHETERIZATION  01/12/1996   THE EF IS DIFFICULT TO ESTIMATE DUE TO PVCS. SEVERE TWO-VESSEL OBSTRUCTIVE ATHERSCLEROTIC CAD WITH COMPLEX STENOSIS IN THE LAD AND DIAGONAL VESSELS  . CARDIAC CATHETERIZATION  07/29/1991   EF 80-85%  . CORONARY ARTERY BYPASS GRAFT  1997  . INGUINAL HERNIA REPAIR    . OVARIAN CYST REMOVAL    . TONSILLECTOMY AND ADENOIDECTOMY      There were no vitals filed for this visit.      Subjective Assessment - 03/04/16 1510    Subjective Patient reports maybe some increased pain due to the weather, reports feels good when she leaves here.  She reports that she feels good for about a day and ahlf   Currently in Pain? Yes   Pain Score 4    Pain Location Neck   Pain Orientation Left                         [facility] Adult PT Treatment/Exercise - 03/04/16 0001      Exercises   Exercises Neck     Neck  Exercises: Machines for Strengthening   Other Machines for Strengthening shrugs, scapular retractions and gentle cervical rotations, all 2x10 each     Modalities   Modalities Electrical Stimulation;Moist Heat;Ultrasound     Moist Heat Therapy   Number Minutes Moist Heat 15 Minutes   Moist Heat Location Shoulder     Electrical Stimulation   Electrical Stimulation Location left neck area and into the left upper trap   Electrical Stimulation Action IFC   Electrical Stimulation Parameters sitting   Electrical Stimulation Goals Pain     Ultrasound   Ultrasound Location left CT and upper trap area   Ultrasound Parameters 100% 1.3w/cm2   Ultrasound Goals Pain     Manual Therapy   Manual Therapy Soft tissue mobilization   Manual therapy comments some gentle cervical distractions with easy neck motions   Soft tissue mobilization gentle STM to the left upper trap some into the upper trap and cervical area.                  PT Short Term Goals - 02/26/16 1423      PT SHORT TERM GOAL #1   Title independent with initial HEP  Status Achieved           PT Long Term Goals - 03/04/16 1517      PT LONG TERM GOAL #1   Title increase cerivcal extension 25%   Status On-going     PT LONG TERM GOAL #2   Title increase cervical rotation 25%   Status On-going     PT LONG TERM GOAL #3   Title report pain decreased 25%   Status On-going     PT LONG TERM GOAL #4   Title report 25% less frequency of HA   Status Partially Met               Plan - 03/04/16 1515    Clinical Impression Statement Tried some gentle cervical distraction with gentle neck motions, she has a lot of cervical crepitus with head rotation   PT Next Visit Plan see if there is better carryover   Consulted and Agree with Plan of Care Patient      Patient will benefit from skilled therapeutic intervention in order to improve the following deficits and impairments:  Decreased range of motion,  Decreased strength, Increased muscle spasms, Postural dysfunction, Improper body mechanics, Pain  Visit Diagnosis: Cervicalgia  Cramp and spasm     Problem List Patient Active Problem List   Diagnosis Date Noted  . Chest pain   . SOB (shortness of breath)   . Coronary artery disease   . Hyperlipidemia   . Hypertension   . Aortic insufficiency     Sumner Boast., PT  03/04/2016, 3:18 PM  [place], [place], [address] Phone: [phone]   Fax:  [phone]  Name: Jordan [name] MRN: [id number] Date of Birth: 1916-03-03

## 2016-03-11 ENCOUNTER — Encounter: Payer: Self-pay | Admitting: Physical Therapy

## 2016-03-11 ENCOUNTER — Ambulatory Visit: Payer: Medicare Other | Admitting: Physical Therapy

## 2016-03-11 DIAGNOSIS — M542 Cervicalgia: Secondary | ICD-10-CM

## 2016-03-11 DIAGNOSIS — R252 Cramp and spasm: Secondary | ICD-10-CM

## 2016-03-11 NOTE — Therapy (Signed)
[facility] [name] [place], [place], [address] Phone: [phone]   Fax:  [phone]  Physical Therapy Treatment  Patient Details  Name: Jordan [name] Bowditch MRN: [id number] Date of Birth: 13-Jul-1916 Referring Provider: Merlene LaughterHal Stoneking  Encounter Date: 03/11/2016      PT End of Session - 03/11/16 1428    Visit Number 5   Date for PT Re-Evaluation --   PT Start Time 1356   PT Stop Time 1445   PT Time Calculation (min) 49 min   Activity Tolerance Patient limited by pain   Behavior During Therapy [facility] for tasks assessed/performed      Past Medical History:  Diagnosis Date  . Aortic insufficiency    MILD TO MODERATE  . Chest pain   . Coronary artery disease   . Dizziness   . Hyperlipidemia   . Hypertension   . Palpitations   . PVC's (premature ventricular contractions)   . SOB (shortness of breath)   . Spinal stenosis     Past Surgical History:  Procedure Laterality Date  . ABDOMINAL HYSTERECTOMY    . APPENDECTOMY    . CARDIAC CATHETERIZATION  01/12/1996   THE EF IS DIFFICULT TO ESTIMATE DUE TO PVCS. SEVERE TWO-VESSEL OBSTRUCTIVE ATHERSCLEROTIC CAD WITH COMPLEX STENOSIS IN THE LAD AND DIAGONAL VESSELS  . CARDIAC CATHETERIZATION  07/29/1991   EF 80-85%  . CORONARY ARTERY BYPASS GRAFT  1997  . INGUINAL HERNIA REPAIR    . OVARIAN CYST REMOVAL    . TONSILLECTOMY AND ADENOIDECTOMY      There were no vitals filed for this visit.      Subjective Assessment - 03/11/16 1424    Subjective Patient reports that she was having a good day on Sunday, reports that yesterday she was doing her shoulder shrugs and that she had some immediate pain in the left shoulder area and the neck, she reports that since that time she has had severe pain when she turns her head.  c/o a catch   Currently in Pain? Yes   Pain Score 8    Pain Location Neck   Pain Orientation Left   Aggravating Factors  the shrugs really caused pain   Pain Relieving Factors nothing has helped since yesterday                         [facility] Adult PT Treatment/Exercise - 03/11/16 0001      Modalities   Modalities Electrical Stimulation;Moist Heat;Ultrasound     Moist Heat Therapy   Number Minutes Moist Heat 15 Minutes   Moist Heat Location Shoulder     Electrical Stimulation   Electrical Stimulation Location left neck area and into the left upper trap   Electrical Stimulation Action IFC   Electrical Stimulation Parameters sitting   Electrical Stimulation Goals Pain     Ultrasound   Ultrasound Location left C/T area   Ultrasound Parameters 100% 1MHz 1.4w/cm2   Ultrasound Goals Pain     Manual Therapy   Manual Therapy Soft tissue mobilization   Manual therapy comments some gentle cervical distractions with easy neck motions   Soft tissue mobilization gentle STM to the left upper trap some into the upper trap and cervical area.                  PT Short Term Goals - 02/26/16 1423      PT SHORT TERM GOAL #1  Title independent with initial HEP   Status Achieved           PT Long Term Goals - 03/11/16 1430      PT LONG TERM GOAL #1   Title increase cerivcal extension 25%   Status On-going     PT LONG TERM GOAL #2   Title increase cervical rotation 25%   Status On-going     PT LONG TERM GOAL #3   Title report pain decreased 25%   Status On-going     PT LONG TERM GOAL #4   Title report 25% less frequency of HA   Status On-going               Plan - 03/11/16 1429    Clinical Impression Statement Patient iwth increased neck pain since yesterday after trying shoulder shrug exercise, she reports severe catching and sharp pain with head motions, she may have a facet issue.  She had reported that she was having less pain and HA until yesterday   PT Next Visit Plan see if we can get the catch to go away   Consulted and Agree with Plan of Care Patient      Patient will benefit from  skilled therapeutic intervention in order to improve the following deficits and impairments:  Decreased range of motion, Decreased strength, Increased muscle spasms, Postural dysfunction, Improper body mechanics, Pain  Visit Diagnosis: Cervicalgia  Cramp and spasm     Problem List Patient Active Problem List   Diagnosis Date Noted  . Chest pain   . SOB (shortness of breath)   . Coronary artery disease   . Hyperlipidemia   . Hypertension   . Aortic insufficiency     Jearld LeschALBRIGHT,Emileigh Kellett [name]., PT 03/11/2016, 2:33 PM  [facility] [name] [place], [place], [address] Phone: [phone]   Fax:  [phone]  Name: Jordan [name] Tancredi MRN: [id number] Date of Birth: Mar 01, 1916

## 2016-03-18 ENCOUNTER — Ambulatory Visit: Payer: Medicare Other | Admitting: Physical Therapy

## 2016-03-18 ENCOUNTER — Encounter: Payer: Self-pay | Admitting: Physical Therapy

## 2016-03-18 DIAGNOSIS — R252 Cramp and spasm: Secondary | ICD-10-CM

## 2016-03-18 DIAGNOSIS — M542 Cervicalgia: Secondary | ICD-10-CM | POA: Diagnosis not present

## 2016-03-18 NOTE — Therapy (Signed)
[facility] [address] [place], [place], [address] Phone: [phone]   Fax:  [phone]  Physical Therapy Treatment  Patient Details  Name: Jordan [name] MRN: [id number] Date of Birth: Aug 01, 1915 Referring Provider: Merlene LaughterHal Stoneking  Encounter Date: 03/18/2016      PT End of Session - 03/18/16 1427    Visit Number 6   Date for PT Re-Evaluation 04/13/16   PT Start Time 1400   PT Stop Time 1445   PT Time Calculation (min) 45 min      Past Medical History:  Diagnosis Date  . Aortic insufficiency    MILD TO MODERATE  . Chest pain   . Coronary artery disease   . Dizziness   . Hyperlipidemia   . Hypertension   . Palpitations   . PVC's (premature ventricular contractions)   . SOB (shortness of breath)   . Spinal stenosis     Past Surgical History:  Procedure Laterality Date  . ABDOMINAL HYSTERECTOMY    . APPENDECTOMY    . CARDIAC CATHETERIZATION  01/12/1996   THE EF IS DIFFICULT TO ESTIMATE DUE TO PVCS. SEVERE TWO-VESSEL OBSTRUCTIVE ATHERSCLEROTIC CAD WITH COMPLEX STENOSIS IN THE LAD AND DIAGONAL VESSELS  . CARDIAC CATHETERIZATION  07/29/1991   EF 80-85%  . CORONARY ARTERY BYPASS GRAFT  1997  . INGUINAL HERNIA REPAIR    . OVARIAN CYST REMOVAL    . TONSILLECTOMY AND ADENOIDECTOMY      There were no vitals filed for this visit.      Subjective Assessment - 03/18/16 1423    Subjective pain just hits me out of no where and I can t move my neck   Currently in Pain? Yes   Pain Score 10-Worst pain ever   Pain Location Neck   Pain Orientation Left            [facility] PT Assessment - 03/18/16 0001      AROM   Overall AROM Comments cerv rot left 0 with severe pain,RT rot limited 50%                     [facility] Adult PT Treatment/Exercise - 03/18/16 0001      Moist Heat Therapy   Number Minutes Moist Heat 15 Minutes   Moist Heat Location Shoulder     Electrical Stimulation   Electrical  Stimulation Location left neck area and into the left upper trap   Electrical Stimulation Action IFC   Electrical Stimulation Parameters sitting   Electrical Stimulation Goals Pain     Ultrasound   Ultrasound Location left cerv/trap/rhom   Ultrasound Parameters 100% cont 1mhZ 1 w/cm 2   Ultrasound Goals Pain     Manual Therapy   Manual Therapy Soft tissue mobilization   Manual therapy comments gentle STW to left cerv/trap and rhom                  PT Short Term Goals - 02/26/16 1423      PT SHORT TERM GOAL #1   Title independent with initial HEP   Status Achieved           PT Long Term Goals - 03/11/16 1430      PT LONG TERM GOAL #1   Title increase cerivcal extension 25%   Status On-going     PT LONG TERM GOAL #2   Title increase cervical rotation 25%   Status On-going  PT LONG TERM GOAL #3   Title report pain decreased 25%   Status On-going     PT LONG TERM GOAL #4   Title report 25% less frequency of HA   Status On-going               Plan - 03/18/16 1427    Clinical Impression Statement severe pain in left side of neck, very tender lateral T2 on left. mvmt increased shooting pain with audible pops and pt in tears.    PT Next Visit Plan may benefit from gentle cerv traction and supine gentle ROM      Patient will benefit from skilled therapeutic intervention in order to improve the following deficits and impairments:  Decreased range of motion, Decreased strength, Increased muscle spasms, Postural dysfunction, Improper body mechanics, Pain  Visit Diagnosis: Cervicalgia  Cramp and spasm     Problem List Patient Active Problem List   Diagnosis Date Noted  . Chest pain   . SOB (shortness of breath)   . Coronary artery disease   . Hyperlipidemia   . Hypertension   . Aortic insufficiency     PAYSEUR,ANGIE PTA 03/18/2016, 2:31 PM  [facility] [address] [place], [place], [address] Phone: [phone]   Fax:  [phone]  Name: Jordan [name] MRN: [id number] Date of Birth: May 06, 1916

## 2016-03-20 ENCOUNTER — Encounter: Payer: Self-pay | Admitting: Physical Therapy

## 2016-03-20 ENCOUNTER — Ambulatory Visit: Payer: Medicare Other | Admitting: Physical Therapy

## 2016-03-20 DIAGNOSIS — M542 Cervicalgia: Secondary | ICD-10-CM | POA: Diagnosis not present

## 2016-03-20 DIAGNOSIS — R252 Cramp and spasm: Secondary | ICD-10-CM

## 2016-03-20 NOTE — Therapy (Signed)
[facility] [address] [place], [place], [address] Phone: [phone]   Fax:  [phone]  Physical Therapy Treatment  Patient Details  Name: Jordan [name] MRN: [id number] Date of Birth: 07/15/1916 Referring Provider: Merlene LaughterHal Stoneking  Encounter Date: 03/20/2016      PT End of Session - 03/20/16 1439    Visit Number 7   Date for PT Re-Evaluation 04/13/16   PT Start Time 1312   PT Stop Time 1402   PT Time Calculation (min) 50 min   Activity Tolerance Patient limited by pain   Behavior During Therapy [facility] for tasks assessed/performed      Past Medical History:  Diagnosis Date  . Aortic insufficiency    MILD TO MODERATE  . Chest pain   . Coronary artery disease   . Dizziness   . Hyperlipidemia   . Hypertension   . Palpitations   . PVC's (premature ventricular contractions)   . SOB (shortness of breath)   . Spinal stenosis     Past Surgical History:  Procedure Laterality Date  . ABDOMINAL HYSTERECTOMY    . APPENDECTOMY    . CARDIAC CATHETERIZATION  01/12/1996   THE EF IS DIFFICULT TO ESTIMATE DUE TO PVCS. SEVERE TWO-VESSEL OBSTRUCTIVE ATHERSCLEROTIC CAD WITH COMPLEX STENOSIS IN THE LAD AND DIAGONAL VESSELS  . CARDIAC CATHETERIZATION  07/29/1991   EF 80-85%  . CORONARY ARTERY BYPASS GRAFT  1997  . INGUINAL HERNIA REPAIR    . OVARIAN CYST REMOVAL    . TONSILLECTOMY AND ADENOIDECTOMY      There were no vitals filed for this visit.      Subjective Assessment - 03/20/16 1437    Subjective Patient reports feeling a little better, but still very much pain with any head motions   Currently in Pain? Yes   Pain Score 6    Pain Location Neck                         [facility] Adult PT Treatment/Exercise - 03/20/16 0001      Moist Heat Therapy   Number Minutes Moist Heat 15 Minutes   Moist Heat Location Shoulder     Electrical Stimulation   Electrical Stimulation Location left neck area and  into the left upper trap   Electrical Stimulation Action IFC   Electrical Stimulation Parameters sitting   Electrical Stimulation Goals Pain     Ultrasound   Ultrasound Location left cervical and upper trpa area   Ultrasound Parameters 100% 1MHz   Ultrasound Goals Pain     Manual Therapy   Manual Therapy Soft tissue mobilization   Manual therapy comments gentle STW to left cerv/trap and rhom,boids, some gentle manual cervical distraction with gentle head turns while holding distraction                  PT Short Term Goals - 02/26/16 1423      PT SHORT TERM GOAL #1   Title independent with initial HEP   Status Achieved           PT Long Term Goals - 03/11/16 1430      PT LONG TERM GOAL #1   Title increase cerivcal extension 25%   Status On-going     PT LONG TERM GOAL #2   Title increase cervical rotation 25%   Status On-going     PT LONG TERM GOAL #3   Title report pain decreased  25%   Status On-going     PT LONG TERM GOAL #4   Title report 25% less frequency of HA   Status On-going               Plan - 03/20/16 1441    Clinical Impression Statement Some relief with the manual distraction (very gentle).  She is still very gaurded and painful with motions, still seems to be facet in nature but mms are very tight all over and causing pain as well   PT Next Visit Plan may benefit from gentle cerv traction and supine gentle ROM   Consulted and Agree with Plan of Care Patient      Patient will benefit from skilled therapeutic intervention in order to improve the following deficits and impairments:     Visit Diagnosis: Cervicalgia  Cramp and spasm     Problem List Patient Active Problem List   Diagnosis Date Noted  . Chest pain   . SOB (shortness of breath)   . Coronary artery disease   . Hyperlipidemia   . Hypertension   . Aortic insufficiency     Jearld LeschALBRIGHT,[name], PT 03/20/2016, 3:53 PM  [facility] [address] [place], [place], [address] Phone: [phone]   Fax:  [phone]  Name: Jordan [name] MRN: [id number] Date of Birth: Apr 11, 1916

## 2016-03-25 ENCOUNTER — Encounter: Payer: Self-pay | Admitting: Physical Therapy

## 2016-03-25 ENCOUNTER — Ambulatory Visit: Payer: Medicare Other | Admitting: Physical Therapy

## 2016-03-25 DIAGNOSIS — M542 Cervicalgia: Secondary | ICD-10-CM | POA: Diagnosis not present

## 2016-03-25 DIAGNOSIS — R252 Cramp and spasm: Secondary | ICD-10-CM

## 2016-03-25 NOTE — Therapy (Signed)
[facility] [address] [place], [place], [address] Phone: [phone]   Fax:  [phone]  Physical Therapy Treatment  Patient Details  Name: Cyndie ChimeLouise W Rozar MRN: [id number] Date of Birth: [birth year] Referring Provider: Merlene LaughterHal Stoneking  Encounter Date: 03/25/2016      PT End of Session - 03/25/16 1428    Visit Number 8   PT Start Time 1400   PT Stop Time 1450   PT Time Calculation (min) 50 min      Past Medical History:  Diagnosis Date  . Aortic insufficiency    MILD TO MODERATE  . Chest pain   . Coronary artery disease   . Dizziness   . Hyperlipidemia   . Hypertension   . Palpitations   . PVC's (premature ventricular contractions)   . SOB (shortness of breath)   . Spinal stenosis     Past Surgical History:  Procedure Laterality Date  . ABDOMINAL HYSTERECTOMY    . APPENDECTOMY    . CARDIAC CATHETERIZATION  01/12/1996   THE EF IS DIFFICULT TO ESTIMATE DUE TO PVCS. SEVERE TWO-VESSEL OBSTRUCTIVE ATHERSCLEROTIC CAD WITH COMPLEX STENOSIS IN THE LAD AND DIAGONAL VESSELS  . CARDIAC CATHETERIZATION  07/29/1991   EF 80-85%  . CORONARY ARTERY BYPASS GRAFT  1997  . INGUINAL HERNIA REPAIR    . OVARIAN CYST REMOVAL    . TONSILLECTOMY AND ADENOIDECTOMY      There were no vitals filed for this visit.      Subjective Assessment - 03/25/16 1416    Subjective  better as long as I dont move my head to the left,oaky to the right. "locked up " on my yesterday   Pain Score 6    Pain Location Neck   Pain Orientation Left                         [facility] Adult PT Treatment/Exercise - 03/25/16 0001      Modalities   Modalities Traction     Moist Heat Therapy   Number Minutes Moist Heat 15 Minutes   Moist Heat Location Cervical     Electrical Stimulation   Electrical Stimulation Location left neck area and into the left upper trap   Electrical Stimulation Action IFC   Electrical Stimulation  Parameters sitting   Electrical Stimulation Goals Pain     Ultrasound   Ultrasound Location left cerv/trap   Ultrasound Parameters 100% cont 1.2w/cm 2   Ultrasound Goals Pain     Traction   Type of Traction Cervical   Min (lbs) 8   Hold Time static   Time 10 min     Manual Therapy   Manual Therapy Soft tissue mobilization;Manual Traction   Manual therapy comments gentle STW to left cerv/trap and rhom,boids, some gentle manual cervical distraction with gentle head turns while holding distraction   Manual Traction gentle distratcion whiich pt responded well to so tried mech traction                  PT Short Term Goals - 02/26/16 1423      PT SHORT TERM GOAL #1   Title independent with initial HEP   Status Achieved           PT Long Term Goals - 03/11/16 1430      PT LONG TERM GOAL #1   Title increase cerivcal extension 25%   Status On-going  PT LONG TERM GOAL #2   Title increase cervical rotation 25%   Status On-going     PT LONG TERM GOAL #3   Title report pain decreased 25%   Status On-going     PT LONG TERM GOAL #4   Title report 25% less frequency of HA   Status On-going               Plan - 03/25/16 1428    Clinical Impression Statement tolerated light mech traction,very fearful of any mvmt to the left. tried mvmt with manual traction but pt anxious and guarded. Pt responds well to US/estima nd MH   PT Next Visit Plan assess and progress as tolerated      Patient will benefit from skilled therapeutic intervention in order to improve the following deficits and impairments:  Decreased range of motion, Decreased strength, Increased muscle spasms, Postural dysfunction, Improper body mechanics, Pain  Visit Diagnosis: Cervicalgia  Cramp and spasm     Problem List Patient Active Problem List   Diagnosis Date Noted  . Chest pain   . SOB (shortness of breath)   . Coronary artery disease   . Hyperlipidemia   . Hypertension   .  Aortic insufficiency     Raisha Brabender,ANGIE PTA 03/25/2016, 2:31 PM  [facility] [address] [place], [place], [address] Phone: [phone]   Fax:  [phone]  Name: LILYA SMITHERMAN MRN: [id number] Date of Birth: 10/09/1915

## 2016-03-27 ENCOUNTER — Encounter: Payer: Self-pay | Admitting: Physical Therapy

## 2016-03-27 ENCOUNTER — Ambulatory Visit: Payer: Medicare Other | Admitting: Physical Therapy

## 2016-03-27 DIAGNOSIS — R252 Cramp and spasm: Secondary | ICD-10-CM

## 2016-03-27 DIAGNOSIS — M542 Cervicalgia: Secondary | ICD-10-CM

## 2016-03-27 NOTE — Therapy (Signed)
[facility] [place], [place], [address] Phone: [phone]   Fax:  [phone]  Physical Therapy Treatment  Patient Details  Name: Jordan [name] MRN: [id number] Date of Birth: Jun 22, 1916 Referring Provider: Lajean Manes  Encounter Date: 03/27/2016      PT End of Session - 03/27/16 1340    Visit Number 9   Date for PT Re-Evaluation 04/13/16   PT Start Time 1306   PT Stop Time 1400   PT Time Calculation (min) 54 min   Activity Tolerance Patient limited by pain   Behavior During Therapy [facility] for tasks assessed/performed      Past Medical History:  Diagnosis Date  . Aortic insufficiency    MILD TO MODERATE  . Chest pain   . Coronary artery disease   . Dizziness   . Hyperlipidemia   . Hypertension   . Palpitations   . PVC's (premature ventricular contractions)   . SOB (shortness of breath)   . Spinal stenosis     Past Surgical History:  Procedure Laterality Date  . ABDOMINAL HYSTERECTOMY    . APPENDECTOMY    . CARDIAC CATHETERIZATION  01/12/1996   THE EF IS DIFFICULT TO ESTIMATE DUE TO PVCS. SEVERE TWO-VESSEL OBSTRUCTIVE ATHERSCLEROTIC CAD WITH COMPLEX STENOSIS IN THE LAD AND DIAGONAL VESSELS  . CARDIAC CATHETERIZATION  07/29/1991   EF 80-85%  . CORONARY ARTERY BYPASS GRAFT  1997  . INGUINAL HERNIA REPAIR    . OVARIAN CYST REMOVAL    . TONSILLECTOMY AND ADENOIDECTOMY      There were no vitals filed for this visit.      Subjective Assessment - 03/27/16 1338    Subjective Reports that the traction last time really made her sore, she reports that she is feeling better and having less episodes of the locking and severe pain but it still occurs on a daily basis   Currently in Pain? Yes   Pain Score 5    Pain Location Neck   Pain Orientation Left   Pain Descriptors / Indicators Sharp   Aggravating Factors  moving head causes sharp pain   Pain Relieving Factors the gentle stretches help                          [facility] Adult PT Treatment/Exercise - 03/27/16 0001      Moist Heat Therapy   Number Minutes Moist Heat 15 Minutes   Moist Heat Location Cervical     Electrical Stimulation   Electrical Stimulation Location left neck area and into the left upper trap   Electrical Stimulation Action IFC   Electrical Stimulation Parameters sitting   Electrical Stimulation Goals Pain     Ultrasound   Ultrasound Location left neck and upper trap area   Ultrasound Parameters 100% 1.2 w/cm2   Ultrasound Goals Pain     Manual Therapy   Manual Therapy Soft tissue mobilization;Manual Traction   Manual therapy comments gentle STW to left cerv/trap and rhom,boids, some gentle manual cervical distraction with gentle head turns while holding distraction   Manual Traction gentle distratcion some easy passive head motions and some pushing shoulders down to get a stretch                  PT Short Term Goals - 02/26/16 1423      PT SHORT TERM GOAL #1   Title independent with initial HEP   Status Achieved  PT Long Term Goals - 03/27/16 1351      PT LONG TERM GOAL #1   Title increase cerivcal extension 25%   Status Partially Met               Plan - 03/27/16 1341    Clinical Impression Statement Passive traction in sitting seems to be better, still gets the catch on a daily basis.  Mostly with quicker head motions   PT Next Visit Plan continue with the current treatment that seems to be helping some, progress is slow   Consulted and Agree with Plan of Care Patient      Patient will benefit from skilled therapeutic intervention in order to improve the following deficits and impairments:  Decreased range of motion, Decreased strength, Increased muscle spasms, Postural dysfunction, Improper body mechanics, Pain  Visit Diagnosis: Cervicalgia  Cramp and spasm     Problem List Patient Active Problem List   Diagnosis Date Noted  .  Chest pain   . SOB (shortness of breath)   . Coronary artery disease   . Hyperlipidemia   . Hypertension   . Aortic insufficiency     Sumner Boast., PT 03/27/2016, 1:52 PM  [place] [place], [place], [address] Phone: [phone]   Fax:  [phone]  Name: VERNIA TEEM MRN: [id number] Date of Birth: January 25, 1916

## 2016-04-02 ENCOUNTER — Encounter: Payer: Self-pay | Admitting: Physical Therapy

## 2016-04-02 ENCOUNTER — Ambulatory Visit: Payer: Medicare Other | Attending: Geriatric Medicine | Admitting: Physical Therapy

## 2016-04-02 DIAGNOSIS — R252 Cramp and spasm: Secondary | ICD-10-CM | POA: Diagnosis present

## 2016-04-02 DIAGNOSIS — M542 Cervicalgia: Secondary | ICD-10-CM | POA: Insufficient documentation

## 2016-04-02 NOTE — Therapy (Addendum)
[place], [place], [address] Phone: [phone]   Fax:  [phone]  Physical Therapy Treatment  Patient Details  Name: LONNIE ROSADO MRN: [id number] Date of Birth: 05/27/1916 Referring Provider: Lajean Manes  Encounter Date: 04/02/2016      PT End of Session - 04/02/16 1518    Visit Number 10   Date for PT Re-Evaluation 04/13/16   PT Start Time 1435   PT Stop Time 1530   PT Time Calculation (min) 55 min   Activity Tolerance Patient limited by pain   Behavior During Therapy [facility] for tasks assessed/performed      Past Medical History:  Diagnosis Date  . Aortic insufficiency    MILD TO MODERATE  . Chest pain   . Coronary artery disease   . Dizziness   . Hyperlipidemia   . Hypertension   . Palpitations   . PVC's (premature ventricular contractions)   . SOB (shortness of breath)   . Spinal stenosis     Past Surgical History:  Procedure Laterality Date  . ABDOMINAL HYSTERECTOMY    . APPENDECTOMY    . CARDIAC CATHETERIZATION  01/12/1996   THE EF IS DIFFICULT TO ESTIMATE DUE TO PVCS. SEVERE TWO-VESSEL OBSTRUCTIVE ATHERSCLEROTIC CAD WITH COMPLEX STENOSIS IN THE LAD AND DIAGONAL VESSELS  . CARDIAC CATHETERIZATION  07/29/1991   EF 80-85%  . CORONARY ARTERY BYPASS GRAFT  1997  . INGUINAL HERNIA REPAIR    . OVARIAN CYST REMOVAL    . TONSILLECTOMY AND ADENOIDECTOMY      There were no vitals filed for this visit.      Subjective Assessment - 04/02/16 1516    Subjective Patient reports that she has had less times that the neck catches and causes her to scream out, she does report that this does still happen daily.   Currently in Pain? Yes   Pain Score 4    Pain Location Neck   Pain Orientation Left   Pain Descriptors / Indicators Spasm   Pain Type Chronic pain   Aggravating Factors  heat motions, especially looking up and turning the head                          [facility] Adult PT Treatment/Exercise - 04/02/16 0001      Electrical Stimulation   Electrical Stimulation Location left neck area and into the left upper trap   Electrical Stimulation Action IFC   Electrical Stimulation Parameters sitting   Electrical Stimulation Goals Pain     Ultrasound   Ultrasound Location left neck area   Ultrasound Parameters 100% 1MHz 1.3w/cm2     Manual Therapy   Manual Therapy Soft tissue mobilization;Manual Traction   Manual therapy comments gentle STW to left cerv/trap and rhom,boids, some gentle manual cervical distraction with gentle head turns while holding distraction, tried some very light isometrics   Manual Traction gentle distratcion some easy passive head motions and some pushing shoulders down to get a stretch                  PT Short Term Goals - 02/26/16 1423      PT SHORT TERM GOAL #1   Title independent with initial HEP   Status Achieved           PT Long Term Goals - 04/02/16 1528      PT LONG TERM GOAL #1   Title increase  cerivcal extension 25%   Status On-going     PT LONG TERM GOAL #2   Title increase cervical rotation 25%   Status On-going     PT LONG TERM GOAL #3   Title report pain decreased 25%   Status On-going     PT LONG TERM GOAL #4   Title report 25% less frequency of HA   Status Partially Met               Plan - Apr 30, 2016 1519    Clinical Impression Statement Isometrics were okay, tried without heat today as she reports that she gets cold after the heat and that may tense her up   PT Next Visit Plan continue with the current treatment that seems to be helping some, progress is slow   Consulted and Agree with Plan of Care Patient      Patient will benefit from skilled therapeutic intervention in order to improve the following deficits and impairments:  Decreased range of motion, Decreased strength, Increased muscle spasms, Postural dysfunction, Improper body mechanics, Pain  Visit  Diagnosis: Cervicalgia  Cramp and spasm       G-Codes - 04-30-16 1528    Functional Assessment Tool Used foto 59% limitation   Functional Limitation Carrying, moving and handling objects   Carrying, Moving and Handling Objects Current Status (A3557) At least 40 percent but less than 60 percent impaired, limited or restricted   Carrying, Moving and Handling Objects Goal Status (D2202) At least 40 percent but less than 60 percent impaired, limited or restricted      Problem List Patient Active Problem List   Diagnosis Date Noted  . Chest pain   . SOB (shortness of breath)   . Coronary artery disease   . Hyperlipidemia   . Hypertension   . Aortic insufficiency   PHYSICAL THERAPY DISCHARGE SUMMARY   Plan: Patient agrees to discharge.  Patient goals were not met. Patient is being discharged due to lack of progress.  ?????       Sumner Boast., PT 04-30-16, 3:29 PM  [place], [place], [address] Phone: [phone]   Fax:  [phone]  Name: ASHETON VIRAMONTES MRN: [id number] Date of Birth: 1916-05-01

## 2016-04-08 ENCOUNTER — Other Ambulatory Visit: Payer: Self-pay | Admitting: Geriatric Medicine

## 2016-04-08 ENCOUNTER — Ambulatory Visit: Payer: Medicare Other | Admitting: Physical Therapy

## 2016-04-08 DIAGNOSIS — M542 Cervicalgia: Secondary | ICD-10-CM

## 2016-04-09 ENCOUNTER — Ambulatory Visit
Admission: RE | Admit: 2016-04-09 | Discharge: 2016-04-09 | Disposition: A | Discharge status: Home / Self Care | Payer: Medicare Other | Source: Ambulatory Visit | Attending: Geriatric Medicine | Admitting: Geriatric Medicine

## 2016-04-09 DIAGNOSIS — M542 Cervicalgia: Secondary | ICD-10-CM

## 2016-04-10 ENCOUNTER — Ambulatory Visit: Payer: Medicare Other | Admitting: Physical Therapy

## 2016-04-14 ENCOUNTER — Other Ambulatory Visit: Payer: Self-pay | Admitting: Geriatric Medicine

## 2016-04-14 DIAGNOSIS — M542 Cervicalgia: Secondary | ICD-10-CM

## 2016-09-06 ENCOUNTER — Emergency Department (HOSPITAL_BASED_OUTPATIENT_CLINIC_OR_DEPARTMENT_OTHER)
Admission: EM | Admit: 2016-09-06 | Discharge: 2016-09-06 | Disposition: A | Discharge status: Home / Self Care | Payer: Medicare Other | Attending: Emergency Medicine | Admitting: Emergency Medicine

## 2016-09-06 ENCOUNTER — Encounter (HOSPITAL_BASED_OUTPATIENT_CLINIC_OR_DEPARTMENT_OTHER): Payer: Self-pay | Admitting: Emergency Medicine

## 2016-09-06 DIAGNOSIS — I2510 Atherosclerotic heart disease of native coronary artery without angina pectoris: Secondary | ICD-10-CM | POA: Insufficient documentation

## 2016-09-06 DIAGNOSIS — Z79899 Other long term (current) drug therapy: Secondary | ICD-10-CM | POA: Diagnosis not present

## 2016-09-06 DIAGNOSIS — M542 Cervicalgia: Secondary | ICD-10-CM | POA: Diagnosis present

## 2016-09-06 DIAGNOSIS — M436 Torticollis: Secondary | ICD-10-CM | POA: Diagnosis not present

## 2016-09-06 DIAGNOSIS — Z7982 Long term (current) use of aspirin: Secondary | ICD-10-CM | POA: Diagnosis not present

## 2016-09-06 DIAGNOSIS — I10 Essential (primary) hypertension: Secondary | ICD-10-CM | POA: Diagnosis not present

## 2016-09-06 HISTORY — DX: Unspecified hearing loss, unspecified ear: H91.90

## 2016-09-06 MED ORDER — TRAMADOL HCL 50 MG PO TABS
50.0000 mg | ORAL_TABLET | Freq: Once | ORAL | Status: AC
  Administered 2016-09-06: 50 mg via ORAL
  Filled 2016-09-06: qty 1

## 2016-09-06 MED ORDER — METHOCARBAMOL 500 MG PO TABS
1000.0000 mg | ORAL_TABLET | Freq: Once | ORAL | Status: AC
  Administered 2016-09-06: 1000 mg via ORAL
  Filled 2016-09-06: qty 2

## 2016-09-06 NOTE — ED Notes (Signed)
Pt and caregiver given d/c instructions as per chart. Verbalizes understanding. No questions.

## 2016-09-06 NOTE — ED Notes (Signed)
Has caretaker at bedside

## 2016-09-06 NOTE — ED Provider Notes (Signed)
[facility] Provider Note   CSN: [id number] Arrival date & time: 09/06/16  1232   By signing my name below, I, Jordan [name], attest that this documentation has been prepared under the direction and in the presence of Rolland Porter, MD. Electronically Signed: Soijett [name], ED Scribe. 09/06/16. 3:10 PM.  History   Chief Complaint No chief complaint on file.   HPI Jordan [name] is a 83 y.o. female with a PMHx of HTN, spinal stenosis, who presents to the Emergency Department brought in by EMS complaining of posterior neck pain onset this morning. Pt has tried 2 Rx pain pills with mild relief of her symptoms. Pt notes that she is unsure of the prescription pain medications that she took, but notes that she takes them daily. Pt states that following brushing her teeth, she turned her head and felt a sudden pain to her neck. She denies any other symptoms.    The history is provided by the patient and a caregiver. No language interpreter was used.    Past Medical History:  Diagnosis Date  . Aortic insufficiency    MILD TO MODERATE  . Chest pain   . Coronary artery disease   . Dizziness   . Hearing loss   . Hyperlipidemia   . Hypertension   . Palpitations   . PVC's (premature ventricular contractions)   . SOB (shortness of breath)   . Spinal stenosis     Patient Active Problem List   Diagnosis Date Noted  . Chest pain   . SOB (shortness of breath)   . Coronary artery disease   . Hyperlipidemia   . Hypertension   . Aortic insufficiency     Past Surgical History:  Procedure Laterality Date  . ABDOMINAL HYSTERECTOMY    . APPENDECTOMY    . CARDIAC CATHETERIZATION  01/12/1996   THE EF IS DIFFICULT TO ESTIMATE DUE TO PVCS. SEVERE TWO-VESSEL OBSTRUCTIVE ATHERSCLEROTIC CAD WITH COMPLEX STENOSIS IN THE LAD AND DIAGONAL VESSELS  . CARDIAC CATHETERIZATION  07/29/1991   EF 80-85%  . CORONARY ARTERY BYPASS GRAFT  1997  . INGUINAL HERNIA REPAIR    . OVARIAN CYST REMOVAL      . TONSILLECTOMY AND ADENOIDECTOMY      OB History    No data available       Home Medications    Prior to Admission medications   Medication Sig Start Date End Date Taking? Authorizing Provider  acetaminophen (TYLENOL) 500 MG tablet Take 500 mg by mouth 2 (two) times daily.   Yes Historical Provider, MD  amLODipine (NORVASC) 5 MG tablet Take 5 mg by mouth daily.     Yes Historical Provider, MD  aspirin 81 MG tablet Take 81 mg by mouth daily.     Yes Historical Provider, MD  Calcium Carbonate-Vit D-Min (CALTRATE 600+D PLUS) 600-400 MG-UNIT per tablet Take 1 tablet by mouth daily.     Yes Historical Provider, MD  fish oil-omega-3 fatty acids 1000 MG capsule Take 2 g by mouth daily.    Yes Historical Provider, MD  meloxicam (MOBIC) 7.5 MG tablet Take 7.5 mg by mouth 2 (two) times daily.   Yes Historical Provider, MD  methocarbamol (ROBAXIN) 500 MG tablet Take 500 mg by mouth 4 (four) times daily.   Yes Historical Provider, MD  Multiple Vitamin (MULTIVITAMIN WITH MINERALS) TABS Take 1 tablet by mouth daily.   Yes Historical Provider, MD  multivitamin-lutein (OCUVITE-LUTEIN) CAPS Take 1 capsule by mouth 2 (two) times daily.  Yes Historical Provider, MD  Nutritional Supplements (ARTHRO-COMPLEX PO) Take 1 tablet by mouth 2 (two) times daily.   Yes Historical Provider, MD  valsartan-hydrochlorothiazide (DIOVAN-HCT) 160-12.5 MG per tablet Take 0.5 tablets by mouth daily.    Yes Historical Provider, MD  vitamin C (ASCORBIC ACID) 500 MG tablet Take 500 mg by mouth daily.    Yes Historical Provider, MD  colesevelam (WELCHOL) 625 MG tablet Take 625 mg by mouth 2 (two) times daily with a meal.      Historical Provider, MD  isosorbide mononitrate (IMDUR) 60 MG 24 hr tablet Take 1 tablet (60 mg total) by mouth every morning. 02/07/11 05/20/15  [name], MD  nitroGLYCERIN (NITROSTAT) 0.4 MG SL tablet Place 0.4 mg under the tongue every 5 (five) minutes as needed. For chest pain    Historical  Provider, MD  omeprazole (PRILOSEC) 20 MG capsule Take 20 mg by mouth daily.     Historical Provider, MD  traMADol (ULTRAM) 50 MG tablet Take 50 mg by mouth every 6 (six) hours as needed for severe pain.    Historical Provider, MD    Family History Family History  Problem Relation Age of Onset  . Heart disease Mother   . Heart disease Father   . Nephritis Brother     Social History Social History  Substance Use Topics  . Smoking status: Never Smoker  . Smokeless tobacco: Never Used  . Alcohol use No     Allergies   Cafergot; Cephalexin; Clinoril [sulindac]; Codeine; Dolobid [diflunisal]; Equagesic; Flu virus vaccine; Hyzaar [losartan potassium-hctz]; Neosporin [neomycin-polymyxin-gramicidin]; Other; Pce [erythromycin base]; Prednisone; and Tetracyclines & related   Review of Systems Review of Systems  Constitutional: Negative for appetite change, chills, diaphoresis, fatigue and fever.  HENT: Negative for mouth sores, sore throat and trouble swallowing.   Eyes: Negative for visual disturbance.  Respiratory: Negative for cough, chest tightness, shortness of breath and wheezing.   Cardiovascular: Negative for chest pain.  Gastrointestinal: Negative for abdominal distention, abdominal pain, diarrhea, nausea and vomiting.  Endocrine: Negative for polydipsia, polyphagia and polyuria.  Genitourinary: Negative for dysuria, frequency and hematuria.  Musculoskeletal: Positive for neck pain. Negative for gait problem.  Skin: Negative for color change, pallor and rash.  Neurological: Negative for dizziness, syncope, light-headedness and headaches.  Hematological: Does not bruise/bleed easily.  Psychiatric/Behavioral: Negative for behavioral problems and confusion.     Physical Exam Updated Vital Signs BP 167/70 (BP Location: Right Arm)   Pulse 69   Temp 98 F (36.7 C) (Oral)   Resp 16   Ht 5\' 2"  (1.575 m)   Wt 125 lb (56.7 kg)   SpO2 97%   BMI 22.86 kg/m   Physical Exam    Constitutional: She is oriented to person, place, and time. She appears well-developed and well-nourished. No distress.  HENT:  Head: Normocephalic.  Eyes: Conjunctivae are normal. Pupils are equal, round, and reactive to light. No scleral icterus.  Neck: Normal range of motion. Neck supple. No thyromegaly present.  Cardiovascular: Normal rate and regular rhythm.  Exam reveals no gallop and no friction rub.   No murmur heard. Pulmonary/Chest: Effort normal and breath sounds normal. No respiratory distress. She has no wheezes. She has no rales.  Abdominal: Soft. Bowel sounds are normal. She exhibits no distension. There is no tenderness. There is no rebound.  Musculoskeletal:       Left shoulder: She exhibits decreased range of motion, tenderness and spasm.  Palpable spasms and tenderness to left  trapezius, paraspinal cervical muscles, and shoulder. Limited range to rotate to left.  Moving BUE without limitation.   Neurological: She is alert and oriented to person, place, and time.  Skin: Skin is warm and dry. No rash noted.  Psychiatric: She has a normal mood and affect. Her behavior is normal.     ED Treatments / Results  DIAGNOSTIC STUDIES: Oxygen Saturation is 99% on RA, nl by my interpretation.    COORDINATION OF CARE: 3:08 PM Discussed treatment plan with pt at bedside which includes ultram, robaxin, and pt agreed to plan.   Procedures Procedures (including critical care time)  Medications Ordered in ED Medications  traMADol (ULTRAM) tablet 50 mg (not administered)  methocarbamol (ROBAXIN) tablet 1,000 mg (not administered)     Initial Impression / Assessment and Plan / ED Course  I have reviewed the triage vital signs and the nursing notes.  HIstroy of spinal stenosis.  Mini al mechanism of injury. Normal exam x Muscular tenderness. No neuro sympoms or abnormal findings.   Plan Ultram and Robaxin. We did adjust her doses as needed for an additional dose as needed for  the next few days.  Final Clinical Impressions(s) / ED Diagnoses   Final diagnoses:  Neck pain  Torticollis    New Prescriptions New Prescriptions   No medications on file   I personally performed the services described in this documentation, which was scribed in my presence. The recorded information has been reviewed and is accurate.     Rolland Porter, MD 09/06/16 [phone]

## 2016-09-06 NOTE — ED Triage Notes (Signed)
Pain to post neck while brushing her teeth, worse than usual. Denies recent injury

## 2016-09-06 NOTE — ED Notes (Signed)
ED Provider at bedside. 

## 2016-09-06 NOTE — Discharge Instructions (Signed)
Apply ice or teat to painful area of neck as needed.  You may take Methocarbamol (robaxin) every 8 hours as needed for pain.  You amy take your ultram (your pain medication) up to every 8 hours if needed.  Recheck with  Dr. Pete GlatterStoneking.

## 2016-12-23 IMAGING — MR MR CERVICAL SPINE W/O CM
4 of 5 series · 21 of 48 positions shown · non-contrast
Comparison: None.

CLINICAL DATA: Status post fall.  Left-sided pain.

EXAM:
MRI CERVICAL SPINE WITHOUT CONTRAST
TECHNIQUE: Multiplanar, multisequence MR imaging of the cervical spine was
performed. No intravenous contrast was administered.

[Series 3: T2 · sagittal · 3.0mm · 0.39mm/px · 6 of 13 slices shown (1 of 3)]
[im 1/13]
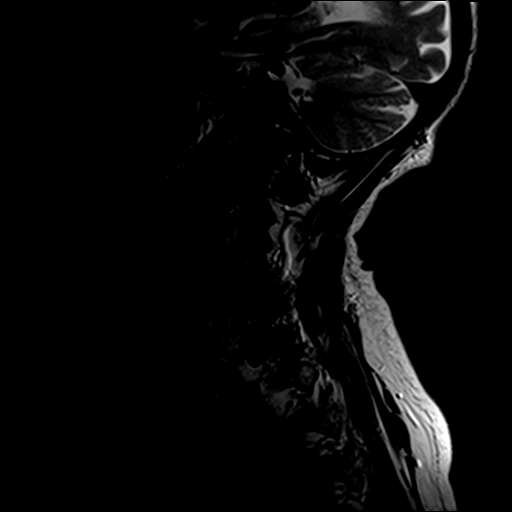
[im 3/13]
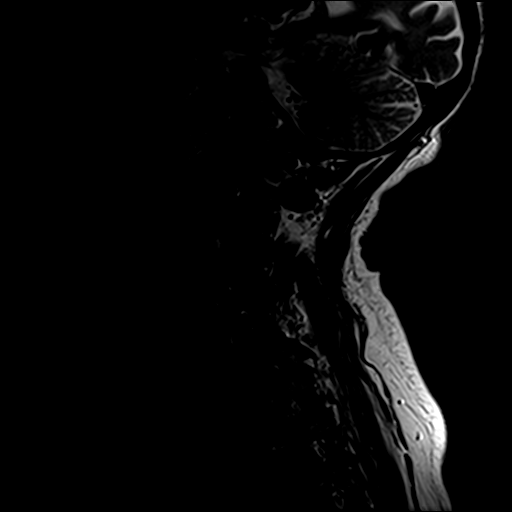
[im 5/13]
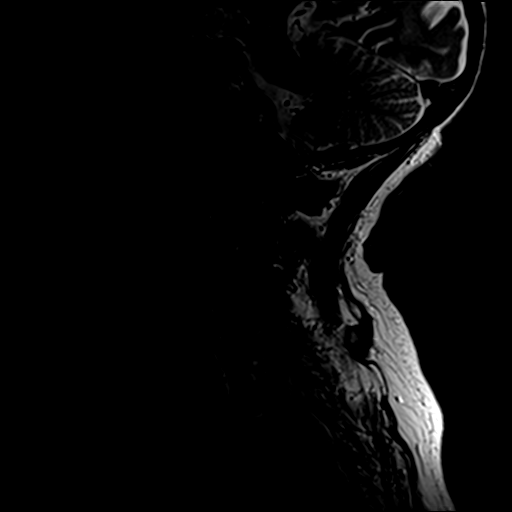
[im 8/13]
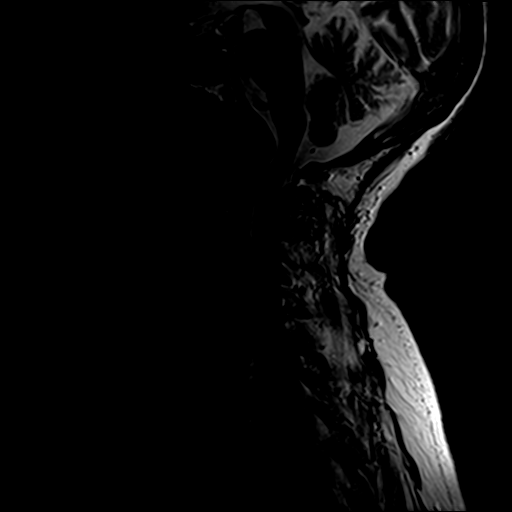
[im 10/13]
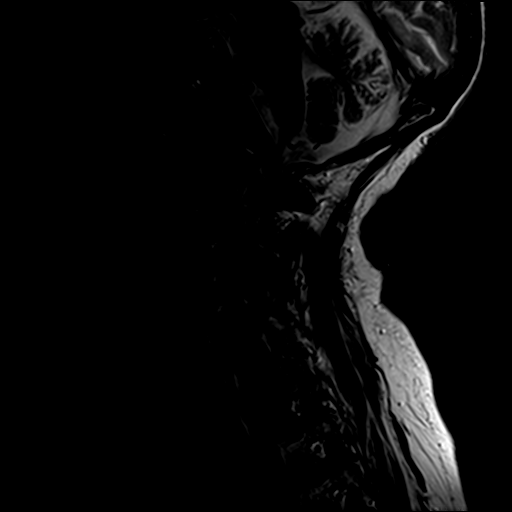
[im 13/13]
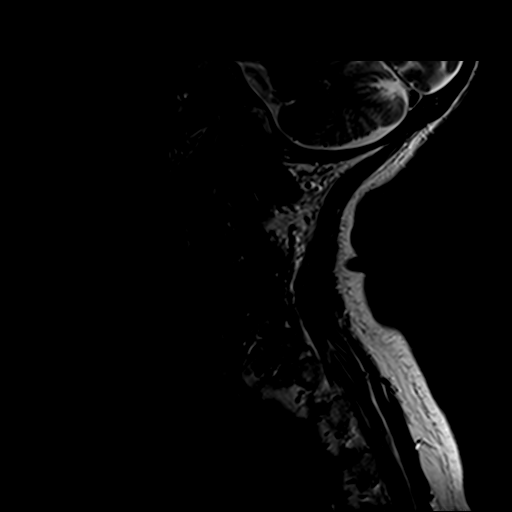

[Series 4: T1 · sagittal · 3.0mm · 0.39mm/px · 3 of 13 slices shown]
[im 3/13]
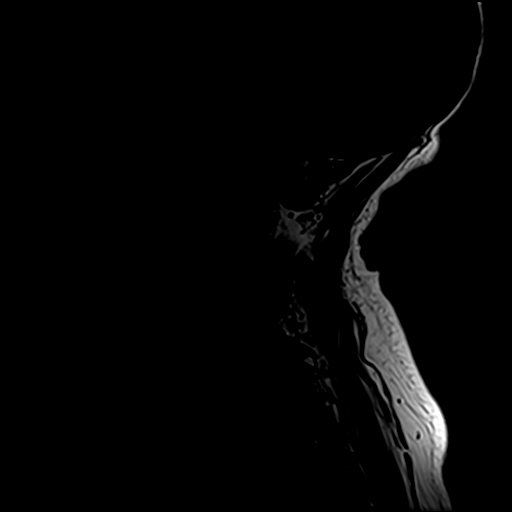
[im 7/13]
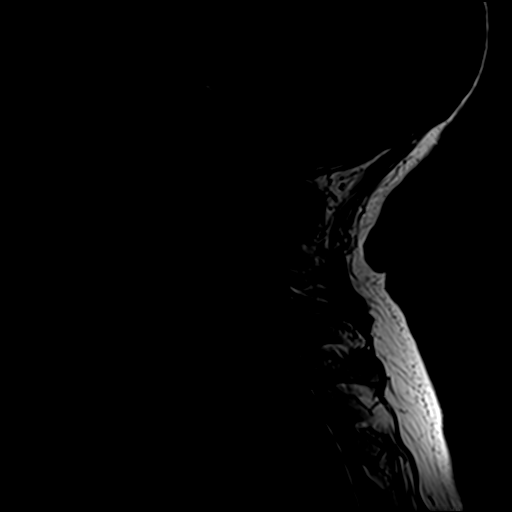
[im 11/13]
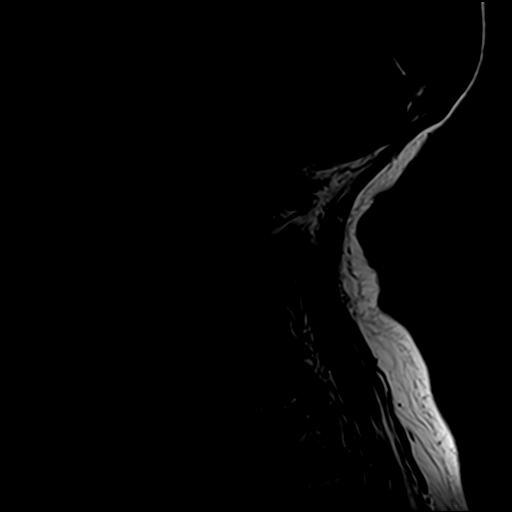

[Series 6: T2 · axial · 3.0mm · 0.27mm/px · z∈[-26,+71]mm · 8 of 27 slices shown (2 of 3)]
[im 1/27]
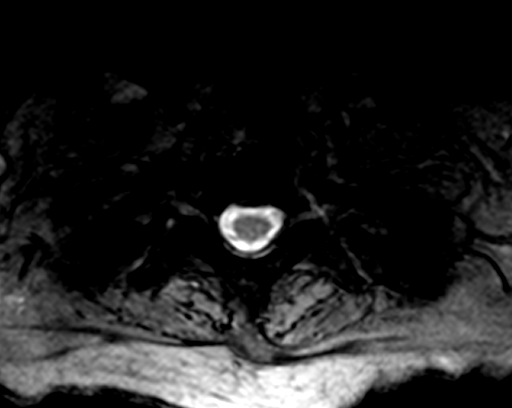
[im 5/27]
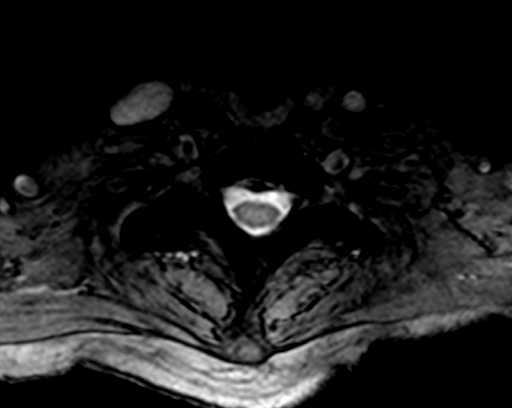
[im 9/27]
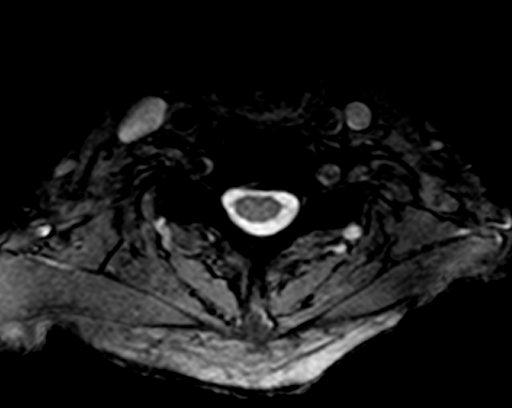
[im 13/27]
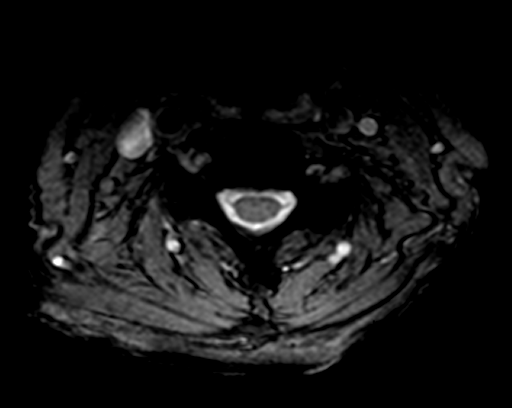
[im 15/27]
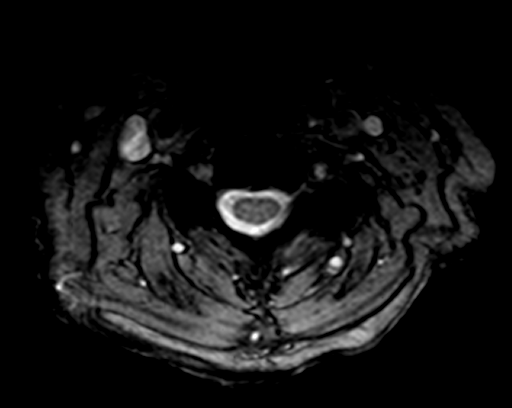
[im 19/27]
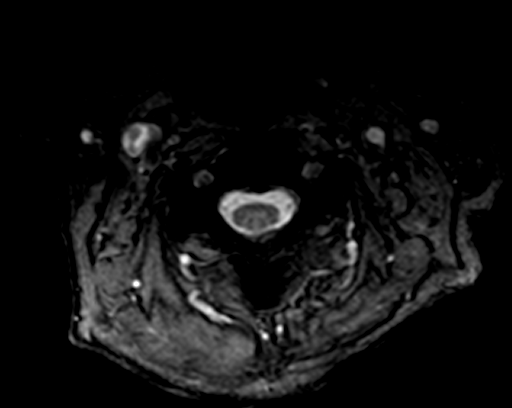
[im 23/27]
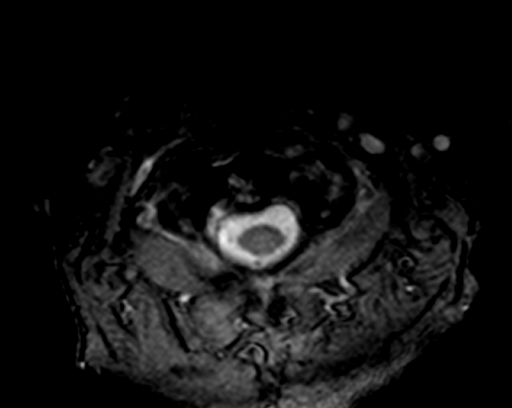
[im 27/27]
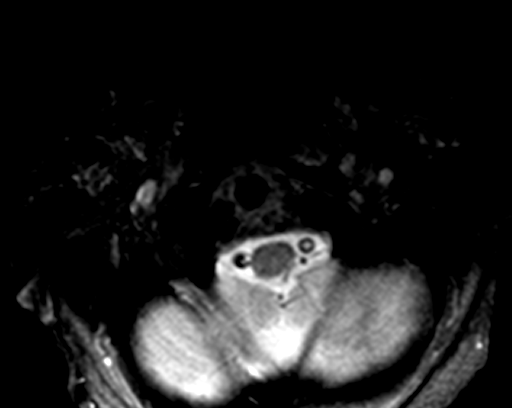

[Series 7: T2 · axial · 3.0mm · 0.27mm/px · z∈[-26,+55]mm · 4 of 27 slices shown (3 of 3)]
[im 1/27]
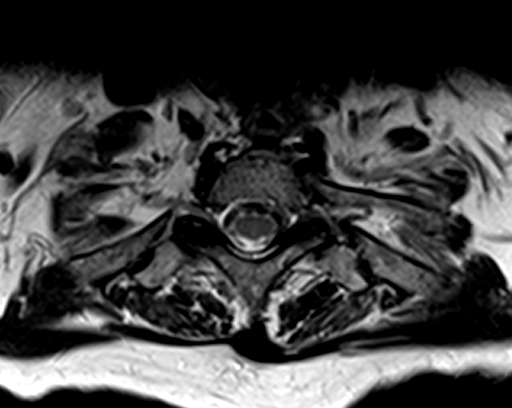
[im 5/27]
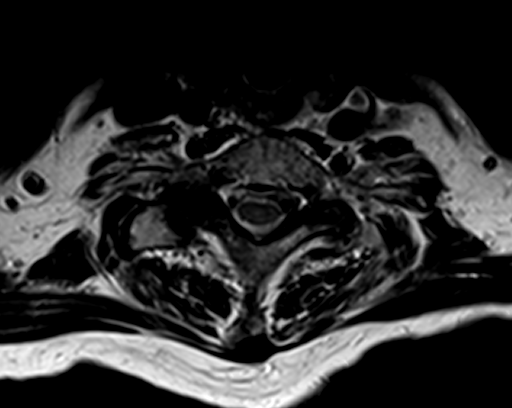
[im 15/27]
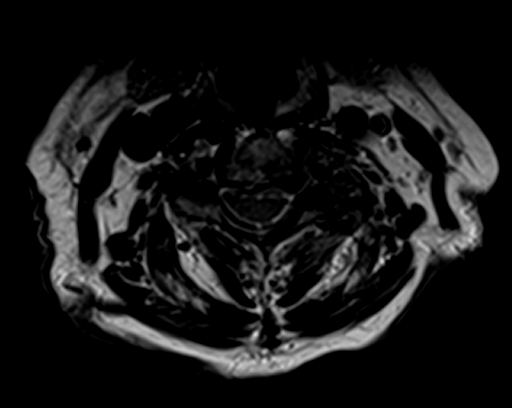
[im 23/27]
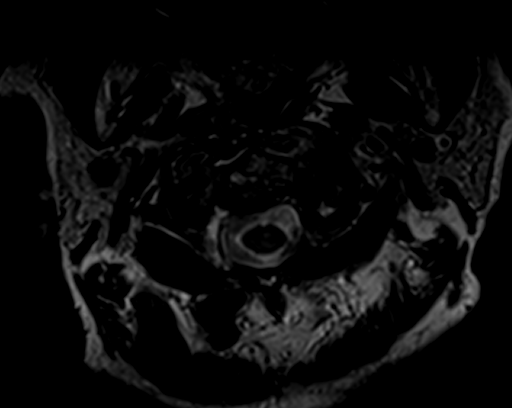

[21 of 48 positions shown; findings below may reference images not displayed]

FINDINGS: Alignment: 3 mm of anterolisthesis of C7 on T1.

Vertebrae: No fracture, evidence of discitis, or bone lesion.

Cord: Normal signal and morphology.

Posterior Fossa, vertebral arteries, paraspinal tissues: Negative.

Disc levels:

Discs: Degenerative disc disease with disc height loss at C3-4,
C4-5, C5-6, C6-7, C7-T1 and T1-2.

C2-3: No significant disc bulge. Bilateral facet arthropathy. No
central canal stenosis.

C3-4: Broad-based disc osteophyte complex. Bilateral facet
arthropathy. No neural foraminal stenosis. No central canal
stenosis.

C4-5: Broad-based disc osteophyte complex. Moderate left facet
arthropathy. Bilateral uncovertebral degenerative changes resulting
in moderate right foraminal stenosis. No central canal stenosis.

C5-6: Broad-based disc osteophyte complex. Mild bilateral facet
arthropathy. No neural foraminal stenosis. No central canal
stenosis.

C6-7: Mild broad-based disc osteophyte complex. No neural foraminal
stenosis. No central canal stenosis.

C7-T1: Broad-based disc bulge. Severe right and mild left facet
arthropathy. Moderate right foraminal stenosis. No left foraminal
stenosis. No central canal stenosis.

T2-3 and T3-4:  Mild broad-based disc bulges.
IMPRESSION: 1. Diffuse cervical spine spondylosis as described above.
2. At C4-5 there is a broad-based disc osteophyte complex. Moderate
left facet arthropathy. Bilateral uncovertebral degenerative changes
resulting in moderate right foraminal stenosis.
3. At C7-T1 there is a broad-based disc bulge. Severe right and mild
left facet arthropathy. Moderate right foraminal stenosis.

## 2018-04-19 ENCOUNTER — Emergency Department (HOSPITAL_BASED_OUTPATIENT_CLINIC_OR_DEPARTMENT_OTHER)
Admission: EM | Admit: 2018-04-19 | Discharge: 2018-04-19 | Disposition: A | Discharge status: Home / Self Care | Payer: Medicare Other | Attending: Emergency Medicine | Admitting: Emergency Medicine

## 2018-04-19 ENCOUNTER — Emergency Department (HOSPITAL_BASED_OUTPATIENT_CLINIC_OR_DEPARTMENT_OTHER): Payer: Medicare Other

## 2018-04-19 ENCOUNTER — Other Ambulatory Visit: Payer: Self-pay

## 2018-04-19 ENCOUNTER — Encounter (HOSPITAL_BASED_OUTPATIENT_CLINIC_OR_DEPARTMENT_OTHER): Payer: Self-pay

## 2018-04-19 DIAGNOSIS — Y999 Unspecified external cause status: Secondary | ICD-10-CM | POA: Insufficient documentation

## 2018-04-19 DIAGNOSIS — Z951 Presence of aortocoronary bypass graft: Secondary | ICD-10-CM | POA: Diagnosis not present

## 2018-04-19 DIAGNOSIS — Y929 Unspecified place or not applicable: Secondary | ICD-10-CM | POA: Insufficient documentation

## 2018-04-19 DIAGNOSIS — Z79899 Other long term (current) drug therapy: Secondary | ICD-10-CM | POA: Diagnosis not present

## 2018-04-19 DIAGNOSIS — Z7982 Long term (current) use of aspirin: Secondary | ICD-10-CM | POA: Insufficient documentation

## 2018-04-19 DIAGNOSIS — S0990XA Unspecified injury of head, initial encounter: Secondary | ICD-10-CM | POA: Diagnosis present

## 2018-04-19 DIAGNOSIS — S0003XA Contusion of scalp, initial encounter: Secondary | ICD-10-CM | POA: Diagnosis not present

## 2018-04-19 DIAGNOSIS — Y93E1 Activity, personal bathing and showering: Secondary | ICD-10-CM | POA: Diagnosis not present

## 2018-04-19 DIAGNOSIS — W182XXA Fall in (into) shower or empty bathtub, initial encounter: Secondary | ICD-10-CM | POA: Diagnosis not present

## 2018-04-19 DIAGNOSIS — W19XXXA Unspecified fall, initial encounter: Secondary | ICD-10-CM

## 2018-04-19 DIAGNOSIS — I10 Essential (primary) hypertension: Secondary | ICD-10-CM | POA: Insufficient documentation

## 2018-04-19 NOTE — ED Provider Notes (Signed)
[facility] EMERGENCY DEPARTMENT Provider Note   CSN: [id number] Arrival date & time: 04/19/18  1717     History   Chief Complaint Chief Complaint  Patient presents with  . Fall    HPI Jordan [name] is a 84 y.o. female.  She had a mechanical fall in the shower today and struck her head on the wall.  There is no LOC.  She has been ambulatory since then.  She has no other complaints.  Not any blood thinners.  No blurry vision no chest pain no shortness of breath no numbness no weakness.  The history is provided by the patient and a friend.  Fall  This is a new problem. The problem has been resolved. Pertinent negatives include no chest pain, no abdominal pain, no headaches and no shortness of breath. Nothing aggravates the symptoms. The symptoms are relieved by rest. She has tried rest for the symptoms. The treatment provided significant relief.    Past Medical History:  Diagnosis Date  . Aortic insufficiency    MILD TO MODERATE  . Chest pain   . Coronary artery disease   . Dizziness   . Hearing loss   . Hyperlipidemia   . Hypertension   . Palpitations   . PVC's (premature ventricular contractions)   . SOB (shortness of breath)   . Spinal stenosis     Patient Active Problem List   Diagnosis Date Noted  . Chest pain   . SOB (shortness of breath)   . Coronary artery disease   . Hyperlipidemia   . Hypertension   . Aortic insufficiency     Past Surgical History:  Procedure Laterality Date  . ABDOMINAL HYSTERECTOMY    . APPENDECTOMY    . CARDIAC CATHETERIZATION  01/12/1996   THE EF IS DIFFICULT TO ESTIMATE DUE TO PVCS. SEVERE TWO-VESSEL OBSTRUCTIVE ATHERSCLEROTIC CAD WITH COMPLEX STENOSIS IN THE LAD AND DIAGONAL VESSELS  . CARDIAC CATHETERIZATION  07/29/1991   EF 80-85%  . CORONARY ARTERY BYPASS GRAFT  1997  . INGUINAL HERNIA REPAIR    . OVARIAN CYST REMOVAL    . TONSILLECTOMY AND ADENOIDECTOMY       OB History   None      Home Medications     Prior to Admission medications   Medication Sig Start Date End Date Taking? Authorizing Provider  acetaminophen (TYLENOL) 500 MG tablet Take 500 mg by mouth 2 (two) times daily.    [provider]  amLODipine (NORVASC) 5 MG tablet Take 5 mg by mouth daily.      [provider]  aspirin 81 MG tablet Take 81 mg by mouth daily.      [provider]  Calcium Carbonate-Vit D-Min (CALTRATE 600+D PLUS) 600-400 MG-UNIT per tablet Take 1 tablet by mouth daily.      [provider]  colesevelam (WELCHOL) 625 MG tablet Take 625 mg by mouth 2 (two) times daily with a meal.      [provider]  fish oil-omega-3 fatty acids 1000 MG capsule Take 2 g by mouth daily.     [provider]  isosorbide mononitrate (IMDUR) 60 MG 24 hr tablet Take 1 tablet (60 mg total) by mouth every morning. 02/07/11 05/20/15  [place], Peter M, MD  meloxicam (MOBIC) 7.5 MG tablet Take 7.5 mg by mouth 2 (two) times daily.    [provider]  methocarbamol (ROBAXIN) 500 MG tablet Take 500 mg by mouth 4 (four) times daily.  [provider]  Multiple Vitamin (MULTIVITAMIN WITH MINERALS) TABS Take 1 tablet by mouth daily.    [provider]  multivitamin-lutein (OCUVITE-LUTEIN) CAPS Take 1 capsule by mouth 2 (two) times daily.    [provider]  nitroGLYCERIN (NITROSTAT) 0.4 MG SL tablet Place 0.4 mg under the tongue every 5 (five) minutes as needed. For chest pain    [provider]  Nutritional Supplements (ARTHRO-COMPLEX PO) Take 1 tablet by mouth 2 (two) times daily.    [provider]  omeprazole (PRILOSEC) 20 MG capsule Take 20 mg by mouth daily.     [provider]  traMADol (ULTRAM) 50 MG tablet Take 50 mg by mouth every 6 (six) hours as needed for severe pain.    [provider]  valsartan-hydrochlorothiazide (DIOVAN-HCT) 160-12.5 MG per tablet Take 0.5 tablets by mouth daily.     [provider]  vitamin C (ASCORBIC ACID) 500 MG tablet Take 500 mg by mouth daily.     [provider]    Family History Family History  Problem Relation Age of Onset  . Heart disease Mother   . Heart disease Father   . Nephritis Brother     Social History Social History   Tobacco Use  . Smoking status: Never Smoker  . Smokeless tobacco: Never Used  Substance Use Topics  . Alcohol use: No  . Drug use: No     Allergies   Cafergot; Cephalexin; Clinoril [sulindac]; Codeine; Dolobid [diflunisal]; Equagesic; Flu virus vaccine; Hyzaar [losartan potassium-hctz]; Neosporin [neomycin-polymyxin-gramicidin]; Other; Pce [erythromycin base]; Prednisone; and Tetracyclines & related   Review of Systems Review of Systems  Constitutional: Negative for fever.  HENT: Negative for sore throat.   Eyes: Negative for visual disturbance.  Respiratory: Negative for shortness of breath.   Cardiovascular: Negative for chest pain.  Gastrointestinal: Negative for abdominal pain.  Genitourinary: Negative for dysuria.  Skin: Negative for rash.  Neurological: Negative for headaches.     Physical Exam Updated Vital Signs BP (!) 173/75   Pulse 72   Temp 98.1 F (36.7 C) (Oral)   Resp 18   SpO2 99%   Physical Exam  Constitutional: She appears well-developed and well-nourished.  HENT:  Head: Normocephalic.  She has about a 3 cm hematoma on her left frontotemporal scalp.  There is some ecchymosis there.  No active bleeding.  Eyes: Conjunctivae are normal.  Neck: Neck supple.  Cardiovascular: Normal rate, regular rhythm and normal heart sounds.  Pulmonary/Chest: Effort normal and breath sounds normal. She has no wheezes.  Abdominal: Soft. She exhibits no mass. There is no tenderness. There is no guarding.  Musculoskeletal: Normal range of motion. She exhibits no deformity.  Neurological: She is alert. She has normal strength. GCS eye subscore is 4. GCS verbal subscore is 5. GCS motor subscore  is 6.  Skin: Skin is warm and dry.  Psychiatric: She has a normal mood and affect.  Nursing note and vitals reviewed.    ED Treatments / Results  Labs (all labs ordered are listed, but only abnormal results are displayed) Labs Reviewed - No data to display  EKG None  Radiology Ct Head Wo Contrast  Result Date: 04/19/2018 CLINICAL DATA:  Patient fell in shower hitting head against wall. EXAM: CT HEAD WITHOUT CONTRAST TECHNIQUE: Contiguous axial images were obtained from the base of the skull through the vertex without intravenous contrast. COMPARISON:  05/20/2015 FINDINGS: Brain: Age related superficial atrophy. Chronic left basal ganglial lacunar infarct with  chronic mild to moderate degree of small vessel ischemia. Midline fourth ventricle and basal cisterns without effacement. No hydrocephalus. No intra-axial mass nor extra-axial fluid collections. No acute intracranial hemorrhage. No large vascular territory infarction. Vascular: Atherosclerosis at the skull base. Skull: No acute skull fracture or suspicious osseous lesions. Sinuses/Orbits: Included orbits and globes are intact with bilateral lens replacements. Paranasal sinuses are clear. Clear mastoids. Other: Remote fractures of the anterior and left posterolateral arch of C1. Left temporoparietal scalp hematoma soft tissue swelling. IMPRESSION: 1. Atrophy with chronic small vessel ischemia. 2. Left anterior temporoparietal scalp hematoma. No underlying acute skull fracture. 3. No acute intracranial abnormality. 4. Chronic remote C1 fractures. Electronically Signed   By: Tollie Eth M.D.   On: 04/19/2018 18:50    Procedures Procedures (including critical care time)  Medications Ordered in ED Medications - No data to display   Initial Impression / Assessment and Plan / ED Course  I have reviewed the triage vital signs and the nursing notes.  Pertinent labs & imaging results that were available during my care of the patient were  reviewed by me and considered in my medical decision making (see chart for details).  Clinical Course as of Apr 20 1129  Mon Apr 19, 2018  1738 Very pleasant 84 year old female who had a mechanical fall here for evaluation of head injury.  She denies any complaints and wants to go home as soon as possible.  I do think she needs a head CT to exclude any intracranial bleeding.  If this is negative I feel she can be discharged and follow-up if any symptoms.   [MB]    Clinical Course User Index [MB] Terrilee Files, MD     Final Clinical Impressions(s) / ED Diagnoses   Final diagnoses:  Injury of head, initial encounter  Fall, initial encounter  Contusion of scalp, initial encounter    ED Discharge Orders    None       Terrilee Files, MD 04/20/18 1130

## 2018-04-19 NOTE — ED Triage Notes (Addendum)
Per EMS patient fell into shower and hit head into wall.  States she went down into shower, denies LOC, denies neck, back pain.  Hematoma to left temporal area. Denies use of blood thinners.  States she takes 1 baby asa daily.  Lives at home by herself.

## 2018-04-19 NOTE — ED Notes (Signed)
Patient's friend-pat willetts at the bedside [phone]

## 2018-04-19 NOTE — ED Notes (Signed)
Patient transported to CT 

## 2018-04-19 NOTE — Discharge Instructions (Signed)
You were evaluated in the emergency department for a head injury after a fall at home.  You had a CAT scan that did not show any obvious injury inside the head.  You have a bruise to the outside which would benefit from Tylenol and ice to the area.  Return if any concerns.

## 2018-04-19 NOTE — ED Notes (Signed)
Pt returned from CT °

## 2018-04-19 NOTE — ED Notes (Signed)
Pt verbalizes understanding of d/c instructions and denies any further needs at this time. 

## 2018-04-20 ENCOUNTER — Other Ambulatory Visit: Payer: Self-pay | Admitting: Geriatric Medicine

## 2018-04-20 ENCOUNTER — Ambulatory Visit
Admission: RE | Admit: 2018-04-20 | Discharge: 2018-04-20 | Disposition: A | Discharge status: Home / Self Care | Payer: Medicare Other | Source: Ambulatory Visit | Attending: Geriatric Medicine | Admitting: Geriatric Medicine

## 2018-04-20 DIAGNOSIS — R05 Cough: Secondary | ICD-10-CM

## 2018-04-20 DIAGNOSIS — R059 Cough, unspecified: Secondary | ICD-10-CM

## 2018-08-16 ENCOUNTER — Encounter: Payer: Self-pay | Admitting: Cardiology

## 2018-10-04 ENCOUNTER — Encounter: Payer: Self-pay | Admitting: Cardiology

## 2018-10-04 ENCOUNTER — Ambulatory Visit: Payer: Medicare Other | Admitting: Cardiology

## 2018-10-04 VITALS — BP 122/76 | HR 71 | Ht 62.0 in | Wt 110.0 lb

## 2018-10-04 DIAGNOSIS — I351 Nonrheumatic aortic (valve) insufficiency: Secondary | ICD-10-CM | POA: Diagnosis not present

## 2018-10-04 DIAGNOSIS — I25119 Atherosclerotic heart disease of native coronary artery with unspecified angina pectoris: Secondary | ICD-10-CM

## 2018-10-04 DIAGNOSIS — Z1329 Encounter for screening for other suspected endocrine disorder: Secondary | ICD-10-CM

## 2018-10-04 DIAGNOSIS — I480 Paroxysmal atrial fibrillation: Secondary | ICD-10-CM

## 2018-10-04 DIAGNOSIS — I10 Essential (primary) hypertension: Secondary | ICD-10-CM | POA: Diagnosis not present

## 2018-10-04 HISTORY — DX: Paroxysmal atrial fibrillation: I48.0

## 2018-10-04 MED ORDER — AMIODARONE HCL 200 MG PO TABS: 200.0000 mg | ORAL_TABLET | Freq: Every day | ORAL | 2 refills | Status: DC

## 2018-10-04 NOTE — Progress Notes (Signed)
Cardiology Office Note:    Date:  10/04/2018   ID:  Jordan [name], DOB 21-Feb-1916, MRN [id number]  PCP:  Merlene Laughter, MD  Cardiologist:  Garwin Brothers, MD   Referring MD: Merlene Laughter, MD    ASSESSMENT:    1. Coronary artery disease with angina pectoris, unspecified vessel or lesion type, unspecified whether native or transplanted heart (HCC)   2. Essential hypertension   3. Aortic valve insufficiency, etiology of cardiac valve disease unspecified   4. Thyroid disorder screening   5. Paroxysmal atrial fibrillation (HCC)    PLAN:    In order of problems listed above:  1. I discussed my findings with the patient at extensive length.  Secondary prevention stressed. 2. Atrial fibrillation issue was discussed.  Rate and rhythm issues were also elaborated.  In view of her frequent falls I do not think she is a candidate for anticoagulation.  I think she is high risk for bleeding especially intracranial bleeding I discussed this with her.  She is vehemently against anticoagulation and we will stop it at this time.  She is not keen on taking aspirin full-strength either.  I respect her wishes. 3. I think her blood pressure is borderline and therefore I will stop her current blood pressure medicine.  She is taking half a tablet of it. 4. I have initiated her on amiodarone 200 mg daily I think this will help keep her in sinus rhythm.  Echocardiogram will be done to assess murmur heard on auscultation and also help me assess left ventricular systolic function and left atrial size. 5. She will be seen in follow-up appointment in a month or earlier if she has any concerns.  Eventually if she remains in stable sinus rhythm with gait stability then she may be a candidate for long-term anticoagulation.    Medication Adjustments/Labs and Tests Ordered: Current medicines are reviewed at length with the patient today.  Concerns regarding medicines are outlined above.  Orders Placed This  Encounter  Procedures  . DG Chest 2 View  . TSH  . Basic Metabolic Panel (BMET)  . EKG 12-Lead  . ECHOCARDIOGRAM COMPLETE   Meds ordered this encounter  Medications  . amiodarone (PACERONE) 200 MG tablet    Sig: Take 1 tablet (200 mg total) by mouth daily.    Dispense:  30 tablet    Refill:  2     History of Present Illness:    Jordan [name] is a 85 y.o. female who is being seen today for the evaluation of syncopal episodes and paroxysmal atrial fibrillation at the request of Merlene Laughter, MD.  Patient is a pleasant 85 year old female.  She is brought in by her friend.  She was referred because of abnormal Holter monitoring.  The 2-week event monitoring has revealed atrial fibrillation with rapid ventricular rate at times.  This occurs paroxysmally.  She has had 2 falls in the recent past.  She denies any chest pain orthopnea or PND.  She is alert awake oriented and is doing amazingly good for her age.  At the time of my evaluation, the patient is alert awake oriented and in no distress.  Past Medical History:  Diagnosis Date  . Aortic insufficiency    MILD TO MODERATE  . Chest pain   . Coronary artery disease   . Dizziness   . Hearing loss   . Hyperlipidemia   . Hypertension   . Palpitations   . PVC's (premature ventricular contractions)   .  SOB (shortness of breath)   . Spinal stenosis     Past Surgical History:  Procedure Laterality Date  . ABDOMINAL HYSTERECTOMY    . APPENDECTOMY    . CARDIAC CATHETERIZATION  01/12/1996   THE EF IS DIFFICULT TO ESTIMATE DUE TO PVCS. SEVERE TWO-VESSEL OBSTRUCTIVE ATHERSCLEROTIC CAD WITH COMPLEX STENOSIS IN THE LAD AND DIAGONAL VESSELS  . CARDIAC CATHETERIZATION  07/29/1991   EF 80-85%  . CORONARY ARTERY BYPASS GRAFT  1997  . INGUINAL HERNIA REPAIR    . OVARIAN CYST REMOVAL    . TONSILLECTOMY AND ADENOIDECTOMY      Current Medications: Current Meds  Medication Sig  . acetaminophen (TYLENOL) 500 MG tablet Take 500 mg by mouth  2 (two) times daily.  Marland Kitchen amLODipine (NORVASC) 5 MG tablet Take 5 mg by mouth daily.    . Calcium Carbonate-Vit D-Min (CALTRATE 600+D PLUS) 600-400 MG-UNIT per tablet Take 1 tablet by mouth daily.    . Multiple Vitamin (MULTIVITAMIN WITH MINERALS) TABS Take 1 tablet by mouth daily.  . multivitamin-lutein (OCUVITE-LUTEIN) CAPS Take 1 capsule by mouth 2 (two) times daily.  . nitroGLYCERIN (NITROSTAT) 0.4 MG SL tablet Place 0.4 mg under the tongue every 5 (five) minutes as needed. For chest pain  . Nutritional Supplements (ARTHRO-COMPLEX PO) Take 1 tablet by mouth 2 (two) times daily.  Marland Kitchen omeprazole (PRILOSEC) 20 MG capsule Take 20 mg by mouth daily.   . vitamin C (ASCORBIC ACID) 500 MG tablet Take 500 mg by mouth daily.   . [DISCONTINUED] meloxicam (MOBIC) 7.5 MG tablet Take 7.5 mg by mouth 2 (two) times daily.  . [DISCONTINUED] valsartan-hydrochlorothiazide (DIOVAN-HCT) 160-12.5 MG per tablet Take 0.5 tablets by mouth daily.      Allergies:   Cafergot; Cephalexin; Clinoril [sulindac]; Codeine; Dolobid [diflunisal]; Equagesic; Flu virus vaccine; Hyzaar [losartan potassium-hctz]; Neosporin [neomycin-polymyxin-gramicidin]; Other; Pce [erythromycin base]; Prednisone; and Tetracyclines & related   Social History   Socioeconomic History  . Marital status: Single    Spouse name: Not on file  . Number of children: Not on file  . Years of education: Not on file  . Highest education level: Not on file  Occupational History  . Not on file  Social Needs  . Financial resource strain: Not on file  . Food insecurity:    Worry: Not on file    Inability: Not on file  . Transportation needs:    Medical: Not on file    Non-medical: Not on file  Tobacco Use  . Smoking status: Never Smoker  . Smokeless tobacco: Never Used  Substance and Sexual Activity  . Alcohol use: No  . Drug use: No  . Sexual activity: Not on file  Lifestyle  . Physical activity:    Days per week: Not on file    Minutes per  session: Not on file  . Stress: Not on file  Relationships  . Social connections:    Talks on phone: Not on file    Gets together: Not on file    Attends religious service: Not on file    Active member of club or organization: Not on file    Attends meetings of clubs or organizations: Not on file    Relationship status: Not on file  Other Topics Concern  . Not on file  Social History Narrative  . Not on file     Family History: The patient's family history includes Heart disease in her father and mother; Nephritis in her brother.  ROS:  Please see the history of present illness.    All other systems reviewed and are negative.  EKGs/Labs/Other Studies Reviewed:    The following studies were reviewed today: I discussed my findings with the patient at length.  Event monitoring reveals approximately fibrillation with elevated ventricular rate.   Recent Labs: No results found for requested labs within last 8760 hours.  Recent Lipid Panel No results found for: CHOL, TRIG, HDL, CHOLHDL, VLDL, LDLCALC, LDLDIRECT  Physical Exam:    VS:  BP 122/76 (BP Location: Right Arm, Patient Position: Sitting, Cuff Size: Normal)   Pulse 71   Ht  (1.575 m)   Wt 110 lb (49.9 kg)   SpO2 97%   BMI 20.12 kg/m     Wt Readings from Last 3 Encounters:  10/04/18 110 lb (49.9 kg)  09/06/16 125 lb (56.7 kg)  05/20/11 124 lb 12.8 oz (56.6 kg)     GEN: Patient is in no acute distress HEENT: Normal NECK: No JVD; No carotid bruits LYMPHATICS: No lymphadenopathy CARDIAC: S1 S2 regular, 2/6 systolic murmur at the apex. RESPIRATORY:  Clear to auscultation without rales, wheezing or rhonchi  ABDOMEN: Soft, non-tender, non-distended MUSCULOSKELETAL:  No edema; No deformity  SKIN: Warm and dry NEUROLOGIC:  Alert and oriented x 3 PSYCHIATRIC:  Normal affect    Signed, Garwin Brothers, MD  10/04/2018 4:22 PM    [facility]

## 2018-10-04 NOTE — Patient Instructions (Signed)
Medication Instructions:  Your physician has recommended you make the following change in your medication:   START taking amiodarone  (1 tablet) daily STOP taking Eliquis  STOP taking valsartan  If you need a refill on your cardiac medications before your next appointment, please call your pharmacy.   Lab work: Your physician recommends that you have BMP and TSH drawn today.    If you have labs (blood work) drawn today and your tests are completely normal, you will receive your results only by: Marland Kitchen MyChart Message (if you have MyChart) OR . A paper copy in the mail If you have any lab test that is abnormal or we need to change your treatment, we will call you to review the results.  Testing/Procedures: An EKG was performed today  Your physician has requested that you have an echocardiogram. Echocardiography is a painless test that uses sound waves to create images of your heart. It provides your doctor with information about the size and shape of your heart and how well your heart's chambers and valves are working. This procedure takes approximately one hour. There are no restrictions for this procedure.  A chest x-ray takes a picture of the organs and structures inside the chest, including the heart, lungs, and blood vessels. This test can show several things, including, whether the heart is enlarges; whether fluid is building up in the lungs; and whether pacemaker / defibrillator leads are still in place.   Follow-Up: At [facility], you and your health needs are our priority.  As part of our continuing mission to provide you with exceptional heart care, we have created designated Provider Care Teams.  These Care Teams include your primary Cardiologist (physician) and Advanced Practice Providers (APPs -  Physician Assistants and Nurse Practitioners) who all work together to provide you with the care you need, when you need it. You will need a follow up appointment in 1 months.    Any Other Special Instructions Will Be Listed Below    Echocardiogram An echocardiogram is a procedure that uses painless sound waves (ultrasound) to produce an image of the heart. Images from an echocardiogram can provide important information about:  Signs of coronary artery disease (CAD).  Aneurysm detection. An aneurysm is a weak or damaged part of an artery wall that bulges out from the normal force of blood pumping through the body.  Heart size and shape. Changes in the size or shape of the heart can be associated with certain conditions, including heart failure, aneurysm, and CAD.  Heart muscle function.  Heart valve function.  Signs of a past heart attack.  Fluid buildup around the heart.  Thickening of the heart muscle.  A tumor or infectious growth around the heart valves. Tell a health care provider about:  Any allergies you have.  All medicines you are taking, including vitamins, herbs, eye drops, creams, and over-the-counter medicines.  Any blood disorders you have.  Any surgeries you have had.  Any medical conditions you have.  Whether you are pregnant or may be pregnant. What are the risks? Generally, this is a safe procedure. However, problems may occur, including:  Allergic reaction to dye (contrast) that may be used during the procedure. What happens before the procedure? No specific preparation is needed. You may eat and drink normally. What happens during the procedure?   An IV tube may be inserted into one of your veins.  You may receive contrast through this tube. A contrast is an injection that improves  the quality of the pictures from your heart.  A gel will be applied to your chest.  A wand-like tool (transducer) will be moved over your chest. The gel will help to transmit the sound waves from the transducer.  The sound waves will harmlessly bounce off of your heart to allow the heart images to be captured in real-time motion. The  images will be recorded on a computer. The procedure may vary among health care providers and hospitals. What happens after the procedure?  You may return to your normal, everyday life, including diet, activities, and medicines, unless your health care provider tells you not to do that. Summary  An echocardiogram is a procedure that uses painless sound waves (ultrasound) to produce an image of the heart.  Images from an echocardiogram can provide important information about the size and shape of your heart, heart muscle function, heart valve function, and fluid buildup around your heart.  You do not need to do anything to prepare before this procedure. You may eat and drink normally.  After the echocardiogram is completed, you may return to your normal, everyday life, unless your health care provider tells you not to do that. This information is not intended to replace advice given to you by your health care provider. Make sure you discuss any questions you have with your health care provider. Document Released: 07/11/2000 Document Revised: 08/16/2016 Document Reviewed: 08/16/2016 Elsevier Interactive Patient Education  2019 Elsevier Inc.   Chest X-Ray  A chest X-ray is a painless test that uses radiation to create images of the structures inside of your chest. Chest X-rays are used to look for many health conditions, including heart failure, pneumonia, tuberculosis, rib fractures, breathing disorders, and cancer. They may be used to diagnose chest pain, constant coughing, or trouble breathing. Tell a health care provider about:  Any allergies you have.  All medicines you are taking, including vitamins, herbs, eye drops, creams, and over-the-counter medicines.  Any surgeries you have had.  Any medical conditions you have.  Whether you are pregnant or may be pregnant. What are the risks? Getting a chest X-ray is a safe procedure. However, you will be exposed to a small amount of  radiation. Being exposed to too much radiation over a lifetime can increase the risk of cancer. This risk is small, but it may occur if you have many X-rays throughout your life. What happens before the procedure?  You may be asked to remove glasses, jewelry, and any other metal objects.  You will be asked to undress from the waist up. You may be given a hospital gown to wear.  You may be asked to wear a protective lead apron to protect parts of your body from radiation. What happens during the procedure?  You will be asked to stand still as each picture is taken to get the best possible images.  You will be asked to take a deep breath and hold your breath for a few seconds.  The X-ray machine will create a picture of your chest using a tiny burst of radiation. This is painless.  More pictures may be taken from other angles. Typically, one picture will be taken while you face the X-ray camera, and another picture will be taken from the side while you stand. If you cannot stand, you may be asked to lie down. The procedure may vary among health care providers and hospitals. What happens after the procedure?  The X-ray(s) will be reviewed by your health care  provider or an X-ray (radiology) specialist.  It is up to you to get your test results. Ask your health care provider, or the department that is doing the test, when your results will be ready.  Your health care provider will tell you if you need more tests or a follow-up exam. Keep all follow-up visits as told by your health care provider. This is important. Summary  A chest X-ray is a safe, painless test that is used to examine the inside of the chest, heart, and lungs.  You will need to undress from the waist up and remove jewelry and metal objects before the procedure.  You will be exposed to a small amount of radiation during the procedure.  The X-ray machine will take one or more pictures of your chest while you remain as  still as possible.  Later, a health care provider or specialist will review the test results with you. This information is not intended to replace advice given to you by your health care provider. Make sure you discuss any questions you have with your health care provider. Document Released: 09/09/2016 Document Revised: 09/09/2016 Document Reviewed: 09/09/2016 Elsevier Interactive Patient Education  [employer].

## 2018-10-05 LAB — BASIC METABOLIC PANEL
BUN/Creatinine Ratio: 35 — ABNORMAL HIGH (ref 12–28)
BUN: 30 mg/dL (ref 10–36)
CHLORIDE: 102 mmol/L (ref 96–106)
CO2: 20 mmol/L (ref 20–29)
Calcium: 10.3 mg/dL (ref 8.7–10.3)
Creatinine, Ser: 0.85 mg/dL (ref 0.57–1.00)
GFR calc Af Amer: 65 mL/min/{1.73_m2} (ref >59)
GFR, EST NON AFRICAN AMERICAN: 56 mL/min/{1.73_m2} — AB (ref >59)
Glucose: 95 mg/dL (ref 65–99)
POTASSIUM: 4.7 mmol/L (ref 3.5–5.2)
Sodium: 142 mmol/L (ref 134–144)

## 2018-10-05 LAB — TSH: TSH: 2.82 u[IU]/mL (ref 0.450–4.500)

## 2018-10-11 ENCOUNTER — Ambulatory Visit (HOSPITAL_BASED_OUTPATIENT_CLINIC_OR_DEPARTMENT_OTHER): Admission: RE | Admit: 2018-10-11 | Payer: Medicare Other | Source: Ambulatory Visit

## 2018-10-11 ENCOUNTER — Other Ambulatory Visit (HOSPITAL_BASED_OUTPATIENT_CLINIC_OR_DEPARTMENT_OTHER): Payer: Medicare Other

## 2018-11-02 ENCOUNTER — Telehealth: Payer: Self-pay | Admitting: Cardiology

## 2018-11-02 NOTE — Telephone Encounter (Signed)
Virtual Visit Pre-Appointment Phone Call  Steps For Call:  1. Confirm consent - "In the setting of the current Covid19 crisis, you are scheduled for a (phone or video) visit with your provider on (date) at (time).  Just as we do with many in-office visits, in order for you to participate in this visit, we must obtain consent.  If you'd like, I can send this to your mychart (if signed up) or email for you to review.  Otherwise, I can obtain your verbal consent now.  All virtual visits are billed to your insurance company just like a normal visit would be.  By agreeing to a virtual visit, we'd like you to understand that the technology does not allow for your provider to perform an examination, and thus may limit your provider's ability to fully assess your condition.  Finally, though the technology is pretty good, we cannot assure that it will always work on either your or our end, and in the setting of a video visit, we may have to convert it to a phone-only visit.  In either situation, we cannot ensure that we have a secure connection.  Are you willing to proceed?"  2. Give patient instructions for WebEx download to smartphone as below if video visit  3. Advise patient to be prepared with any vital sign or heart rhythm information, their current medicines, and a piece of paper and pen handy for any instructions they may receive the day of their visit  4. Inform patient they will receive a phone call 15 minutes prior to their appointment time (may be from unknown caller ID) so they should be prepared to answer  5. Confirm that appointment type is correct in Epic appointment notes (video vs telephone)    TELEPHONE CALL NOTE  Jordan [name] has been deemed a candidate for a follow-up tele-health visit to limit community exposure during the Covid-19 pandemic. I spoke with the patient via phone to ensure availability of phone/video source, confirm preferred email & phone number, and discuss  instructions and expectations.  I reminded Jordan MCCARTT to be prepared with any vital sign and/or heart rhythm information that could potentially be obtained via home monitoring, at the time of her visit. I reminded Jordan DINOLFO to expect a phone call at the time of her visit if her visit.  Did the patient verbally acknowledge consent to treatment? YES/verbal consent  Jordan [name] 11/02/2018 1:13 PM   DOWNLOADING THE WEBEX SOFTWARE TO SMARTPHONE  - If Apple, go to [employer] and type in WebEx in the search bar. Download [employer], the blue/green circle. The app is free but as with any other app downloads, their phone may require them to verify saved payment information or Apple password. The patient does NOT have to create an account.  - If Android, ask patient to go to [employer] and type in WebEx in the search bar. Download [employer], the blue/green circle. The app is free but as with any other app downloads, their phone may require them to verify saved payment information or Android password. The patient does NOT have to create an account.   CONSENT FOR TELE-HEALTH VISIT - PLEASE REVIEW  I hereby voluntarily request, consent and authorize [facility] and its employed or contracted physicians, physician assistants, nurse practitioners or other licensed health care professionals (the Practitioner), to provide me with telemedicine health care services (the Services") as deemed necessary by the treating Practitioner.  I acknowledge and consent to receive the Services by the Practitioner via telemedicine. I understand that the telemedicine visit will involve communicating with the Practitioner through live audiovisual communication technology and the disclosure of certain medical information by electronic transmission. I acknowledge that I have been given the opportunity to request an in-person assessment or other available alternative prior to the telemedicine  visit and am voluntarily participating in the telemedicine visit.  I understand that I have the right to withhold or withdraw my consent to the use of telemedicine in the course of my care at any time, without affecting my right to future care or treatment, and that the Practitioner or I may terminate the telemedicine visit at any time. I understand that I have the right to inspect all information obtained and/or recorded in the course of the telemedicine visit and may receive copies of available information for a reasonable fee.  I understand that some of the potential risks of receiving the Services via telemedicine include:   Delay or interruption in medical evaluation due to technological equipment failure or disruption;  Information transmitted may not be sufficient (e.g. poor resolution of images) to allow for appropriate medical decision making by the Practitioner; and/or   In rare instances, security protocols could fail, causing a breach of personal health information.  Furthermore, I acknowledge that it is my responsibility to provide information about my medical history, conditions and care that is complete and accurate to the best of my ability. I acknowledge that Practitioner's advice, recommendations, and/or decision may be based on factors not within their control, such as incomplete or inaccurate data provided by me or distortions of diagnostic images or specimens that may result from electronic transmissions. I understand that the practice of medicine is not an exact science and that Practitioner makes no warranties or guarantees regarding treatment outcomes. I acknowledge that I will receive a copy of this consent concurrently upon execution via email to the email address I last provided but may also request a printed copy by calling the office of [place].    I understand that my insurance will be billed for this visit.   I have read or had this consent read to me.  I  understand the contents of this consent, which adequately explains the benefits and risks of the Services being provided via telemedicine.   I have been provided ample opportunity to ask questions regarding this consent and the Services and have had my questions answered to my satisfaction.  I give my informed consent for the services to be provided through the use of telemedicine in my medical care  By participating in this telemedicine visit I agree to the above.

## 2018-11-05 ENCOUNTER — Telehealth: Payer: Self-pay

## 2018-11-05 ENCOUNTER — Telehealth (INDEPENDENT_AMBULATORY_CARE_PROVIDER_SITE_OTHER): Payer: Medicare Other | Admitting: Cardiology

## 2018-11-05 ENCOUNTER — Other Ambulatory Visit: Payer: Self-pay

## 2018-11-05 VITALS — Ht 62.0 in | Wt 112.0 lb

## 2018-11-05 DIAGNOSIS — I10 Essential (primary) hypertension: Secondary | ICD-10-CM

## 2018-11-05 DIAGNOSIS — I351 Nonrheumatic aortic (valve) insufficiency: Secondary | ICD-10-CM

## 2018-11-05 DIAGNOSIS — I480 Paroxysmal atrial fibrillation: Secondary | ICD-10-CM

## 2018-11-05 DIAGNOSIS — E782 Mixed hyperlipidemia: Secondary | ICD-10-CM

## 2018-11-05 DIAGNOSIS — I2510 Atherosclerotic heart disease of native coronary artery without angina pectoris: Secondary | ICD-10-CM

## 2018-11-05 DIAGNOSIS — R0602 Shortness of breath: Secondary | ICD-10-CM

## 2018-11-05 MED ORDER — AMIODARONE HCL 100 MG PO TABS: 100.0000 mg | ORAL_TABLET | Freq: Every day | ORAL | 2 refills | Status: DC

## 2018-11-05 NOTE — Patient Instructions (Addendum)
RN called patient on 11/05/18 at 1545 she could not understand over phone. Patient called Jordan [name] her emergency contact person and relayed information about medication changes and scheduled follow up televisit.    Medication Instructions:  DECREASE dose of amiodarone from 200 mg daily to 100 mg daily If you need a refill on your cardiac medications before your next appointment, please call your pharmacy.   Lab work: NONE If you have labs (blood work) drawn today and your tests are completely normal, you will receive your results only by: Marland Kitchen MyChart Message (if you have MyChart) OR . A paper copy in the mail If you have any lab test that is abnormal or we need to change your treatment, we will call you to review the results.  Testing/Procedures: NONE  Follow-Up: At [facility], you and your health needs are our priority.  As part of our continuing mission to provide you with exceptional heart care, we have created designated Provider Care Teams.  These Care Teams include your primary Cardiologist (physician) and Advanced Practice Providers (APPs -  Physician Assistants and Nurse Practitioners) who all work together to provide you with the care you need, when you need it. You will need a follow up appointment in 1 months.

## 2018-11-05 NOTE — Telephone Encounter (Signed)
Called patient to discuss AVS and she is very confused/states she can not hear. RN called emergency contact Mosetta Pigeon to make sure instructions are very clear. Left message for her to return call

## 2018-11-05 NOTE — Progress Notes (Signed)
Virtual Visit via Telephone Note   This visit type was conducted due to national recommendations for restrictions regarding the COVID-19 Pandemic (e.g. social distancing) in an effort to limit this patient's exposure and mitigate transmission in our community.  Due to her co-morbid illnesses, this patient is at least at moderate risk for complications without adequate follow up.  This format is felt to be most appropriate for this patient at this time.  The patient did not have access to video technology/had technical difficulties with video requiring transitioning to audio format only (telephone).  All issues noted in this document were discussed and addressed.  No physical exam could be performed with this format.  Please refer to the patient's chart for her  consent to telehealth for [facility]   Evaluation Performed:  Follow-up visit  Date:  11/05/2018   ID:  Jordan, [name] 1915/09/28, MRN [id number]  Patient Location: Home  Provider Location: Home  PCP:  Merlene Laughter, MD  Cardiologist:  No primary care provider on file.  Electrophysiologist:  None   Chief Complaint: Atrial fibrillation  History of Present Illness:    Jordan [name] is a 85 y.o. female who presents via audio/video conferencing for a telehealth visit today.    Patient is a pleasant 85 year old female.  This teleconference was done by phone.  She mentions to me that ever since amiodarone has been initiated, she has not felt any palpitations and has felt much better.  She does feel short of breath on exertion when she overdoes things at home.  Otherwise she is generally fine.  At the time of my evaluation, the patient is alert awake oriented and in no distress.  The patient does not have symptoms concerning for COVID-19 infection (fever, chills, cough, or new shortness of breath).    Past Medical History:  Diagnosis Date  . Aortic insufficiency    MILD TO MODERATE  . Chest pain   . Coronary artery  disease   . Dizziness   . Hearing loss   . Hyperlipidemia   . Hypertension   . Palpitations   . PVC's (premature ventricular contractions)   . SOB (shortness of breath)   . Spinal stenosis    Past Surgical History:  Procedure Laterality Date  . ABDOMINAL HYSTERECTOMY    . APPENDECTOMY    . CARDIAC CATHETERIZATION  01/12/1996   THE EF IS DIFFICULT TO ESTIMATE DUE TO PVCS. SEVERE TWO-VESSEL OBSTRUCTIVE ATHERSCLEROTIC CAD WITH COMPLEX STENOSIS IN THE LAD AND DIAGONAL VESSELS  . CARDIAC CATHETERIZATION  07/29/1991   EF 80-85%  . CORONARY ARTERY BYPASS GRAFT  1997  . INGUINAL HERNIA REPAIR    . OVARIAN CYST REMOVAL    . TONSILLECTOMY AND ADENOIDECTOMY       Current Meds  Medication Sig  . acetaminophen (TYLENOL) 500 MG tablet Take 500 mg by mouth 2 (two) times daily.  Marland Kitchen amiodarone (PACERONE) 200 MG tablet Take 1 tablet (200 mg total) by mouth daily.  Marland Kitchen amLODipine (NORVASC) 5 MG tablet Take 5 mg by mouth daily.    . Calcium Carbonate-Vit D-Min (CALTRATE 600+D PLUS) 600-400 MG-UNIT per tablet Take 1 tablet by mouth daily.    . Multiple Vitamin (MULTIVITAMIN WITH MINERALS) TABS Take 1 tablet by mouth daily.  . multivitamin-lutein (OCUVITE-LUTEIN) CAPS Take 1 capsule by mouth 2 (two) times daily.  . nitroGLYCERIN (NITROSTAT) 0.4 MG SL tablet Place 0.4 mg under the tongue every 5 (five) minutes as needed. For chest pain  .  Nutritional Supplements (ARTHRO-COMPLEX PO) Take 1 tablet by mouth 2 (two) times daily.  Marland Kitchen. omeprazole (PRILOSEC) 20 MG capsule Take 20 mg by mouth daily.   . vitamin C (ASCORBIC ACID) 500 MG tablet Take 500 mg by mouth daily.      Allergies:   Cafergot; Cephalexin; Clinoril [sulindac]; Codeine; Dolobid [diflunisal]; Equagesic; Flu virus vaccine; Hyzaar [losartan potassium-hctz]; Neosporin [neomycin-polymyxin-gramicidin]; Other; Pce [erythromycin base]; Prednisone; and Tetracyclines & related   Social History   Tobacco Use  . Smoking status: Never Smoker  .  Smokeless tobacco: Never Used  Substance Use Topics  . Alcohol use: No  . Drug use: No     Family Hx: The patient's family history includes Heart disease in her father and mother; Nephritis in her brother.  ROS:   Please see the history of present illness.    Patient denies any chest pain orthopnea or PND and All other systems reviewed and are negative.   Prior CV studies:   The following studies were reviewed today:  No studies were performed.  Labs/Other Tests and Data Reviewed:    EKG:  No ECG reviewed.  Recent Labs: 10/04/2018: BUN 30; Creatinine, Ser 0.85; Potassium 4.7; Sodium 142; TSH 2.820   Recent Lipid Panel No results found for: CHOL, TRIG, HDL, CHOLHDL, LDLCALC, LDLDIRECT  Wt Readings from Last 3 Encounters:  11/05/18 112 lb (50.8 kg)  10/04/18 110 lb (49.9 kg)  09/06/16 125 lb (56.7 kg)     Objective:    Vital Signs:  Ht 5\' 2"  (1.575 m)   Wt 112 lb (50.8 kg)   BMI 20.49 kg/m    Well nourished, well developed female in no acute distress. Patient appeared comfortable over the phone and in no distress  ASSESSMENT & PLAN:    1. Atrial fibrillation: Patient seems to be doing well clinically.  No chest pain orthopnea or PND.  She appears comfortable on the phone and gave good history and symptom profile.  She is taking amiodarone 200 mg a day it appears and we will cut it down to 100 mg a day.  I will set her up for an appointment in a month on by telephone only.  At an opportune time when the viral pandemic ends she would be brought in for an echocardiogram.  Patient had multiple questions which were answered to her satisfaction.  COVID-19 Education: The signs and symptoms of COVID-19 were discussed with the patient and how to seek care for testing (follow up with PCP or arrange E-visit).  The importance of social distancing was discussed today.  Time:   Today, I have spent 14 minutes with the patient with telehealth technology discussing the above  problems.     Medication Adjustments/Labs and Tests Ordered: Current medicines are reviewed at length with the patient today.  Concerns regarding medicines are outlined above.  Tests Ordered: No orders of the defined types were placed in this encounter.  Medication Changes: No orders of the defined types were placed in this encounter.   Disposition:  Follow up in 1 month(s)  Signed, Garwin Brothersajan R , MD  11/05/2018 3:00 PM     [facility]

## 2018-11-05 NOTE — Addendum Note (Signed)
Addended by: Pamala Hurry on: 11/05/2018 04:18 PM   Modules accepted: Orders

## 2018-11-08 ENCOUNTER — Ambulatory Visit: Payer: Medicare Other | Admitting: Cardiology

## 2018-11-17 ENCOUNTER — Telehealth: Payer: Self-pay | Admitting: *Deleted

## 2018-11-17 NOTE — Telephone Encounter (Signed)
Pt called confused about her medication. She had a refill at the pharmacy for her Amiodarone and not sure if she is supposed to be taking the ones she already has or this one or both. I explained that the Pacerone and Amiodarone are the same medicine and she should be taking the Amiodarone 100 mg pills once daily per Dr. Kem Parkinson office note. I went over this with her several times to make sure she understood. She expressed gratitude for the help and said she understood.

## 2018-12-10 ENCOUNTER — Telehealth (INDEPENDENT_AMBULATORY_CARE_PROVIDER_SITE_OTHER): Payer: Medicare Other | Admitting: Cardiology

## 2018-12-10 ENCOUNTER — Other Ambulatory Visit: Payer: Self-pay

## 2018-12-10 ENCOUNTER — Encounter: Payer: Self-pay | Admitting: Cardiology

## 2018-12-10 VITALS — BP 153/59 | HR 59 | Ht 62.0 in | Wt 112.0 lb

## 2018-12-10 DIAGNOSIS — E782 Mixed hyperlipidemia: Secondary | ICD-10-CM

## 2018-12-10 DIAGNOSIS — I10 Essential (primary) hypertension: Secondary | ICD-10-CM

## 2018-12-10 DIAGNOSIS — I480 Paroxysmal atrial fibrillation: Secondary | ICD-10-CM

## 2018-12-10 DIAGNOSIS — I351 Nonrheumatic aortic (valve) insufficiency: Secondary | ICD-10-CM

## 2018-12-10 DIAGNOSIS — I2510 Atherosclerotic heart disease of native coronary artery without angina pectoris: Secondary | ICD-10-CM

## 2018-12-10 NOTE — Progress Notes (Signed)
Virtual Visit via Telephone Note   This visit type was conducted due to national recommendations for restrictions regarding the COVID-19 Pandemic (e.g. social distancing) in an effort to limit this patient's exposure and mitigate transmission in our community.  Due to her co-morbid illnesses, this patient is at least at moderate risk for complications without adequate follow up.  This format is felt to be most appropriate for this patient at this time.  The patient did not have access to video technology/had technical difficulties with video requiring transitioning to audio format only (telephone).  All issues noted in this document were discussed and addressed.  No physical exam could be performed with this format.  Please refer to the patient's chart for her  consent to telehealth for [facility].   Date:  12/10/2018   ID:  Jordan [name], DOB 07/19/1916, MRN [id number]  Patient Location: Home Provider Location: Office  PCP:  Merlene LaughterStoneking, Hal, MD  Cardiologist:  No primary care provider on file.  Electrophysiologist:  None   Evaluation Performed:  Follow-Up Visit  Chief Complaint: Chest discomfort  History of Present Illness:    Jordan ChimeLouise W Stern is a [age] y.o. female with medical history of essential hypertension.  She was evaluated by me for chest discomfort.  Subsequently with medical therapy she is done fine.  She takes care of activities of daily living.  No chest pain orthopnea or PND.  She appeared very happy over the phone today.  Her neighbor accompanied the phone call conversation.  Her neighbor is very supportive.  At the time of my evaluation, the patient is alert awake oriented and in no distress.  The patient does not have symptoms concerning for COVID-19 infection (fever, chills, cough, or new shortness of breath).    Past Medical History:  Diagnosis Date  . Aortic insufficiency    MILD TO MODERATE  . Chest pain   . Coronary artery disease   . Dizziness   . Hearing loss    . Hyperlipidemia   . Hypertension   . Palpitations   . PVC's (premature ventricular contractions)   . SOB (shortness of breath)   . Spinal stenosis    Past Surgical History:  Procedure Laterality Date  . ABDOMINAL HYSTERECTOMY    . APPENDECTOMY    . CARDIAC CATHETERIZATION  01/12/1996   THE EF IS DIFFICULT TO ESTIMATE DUE TO PVCS. SEVERE TWO-VESSEL OBSTRUCTIVE ATHERSCLEROTIC CAD WITH COMPLEX STENOSIS IN THE LAD AND DIAGONAL VESSELS  . CARDIAC CATHETERIZATION  07/29/1991   EF 80-85%  . CORONARY ARTERY BYPASS GRAFT  1997  . INGUINAL HERNIA REPAIR    . OVARIAN CYST REMOVAL    . TONSILLECTOMY AND ADENOIDECTOMY       Current Meds  Medication Sig  . acetaminophen (TYLENOL) 500 MG tablet Take 500 mg by mouth 2 (two) times daily.  Marland Kitchen. amiodarone (PACERONE) 100 MG tablet Take 1 tablet (100 mg total) by mouth daily.  Marland Kitchen. amLODipine (NORVASC) 5 MG tablet Take 5 mg by mouth daily.  . Calcium Carbonate-Vit D-Min (CALTRATE 600+D PLUS) 600-400 MG-UNIT per tablet Take 1 tablet by mouth daily.    . Multiple Vitamin (MULTIVITAMIN WITH MINERALS) TABS Take 1 tablet by mouth daily.  . multivitamin-lutein (OCUVITE-LUTEIN) CAPS Take 1 capsule by mouth 2 (two) times daily.  . nitroGLYCERIN (NITROSTAT) 0.4 MG SL tablet Place 0.4 mg under the tongue every 5 (five) minutes as needed. For chest pain  . Nutritional Supplements (ARTHRO-COMPLEX PO) Take 1 tablet by mouth 2 (  two) times daily.  Marland Kitchen omeprazole (PRILOSEC) 20 MG capsule Take 20 mg by mouth daily.   . vitamin C (ASCORBIC ACID) 500 MG tablet Take 500 mg by mouth daily.      Allergies:   Cafergot; Cephalexin; Clinoril [sulindac]; Codeine; Dolobid [diflunisal]; Equagesic; Flu virus vaccine; Hyzaar [losartan potassium-hctz]; Neosporin [neomycin-polymyxin-gramicidin]; Other; Pce [erythromycin base]; Prednisone; and Tetracyclines & related   Social History   Tobacco Use  . Smoking status: Never Smoker  . Smokeless tobacco: Never Used  Substance Use  Topics  . Alcohol use: No  . Drug use: No     Family Hx: The patient's family history includes Heart disease in her father and mother; Nephritis in her brother.  ROS:   Please see the history of present illness.    As above All other systems reviewed and are negative.   Prior CV studies:   The following studies were reviewed today:  None significant  Labs/Other Tests and Data Reviewed:    EKG:  No ECG reviewed.  Recent Labs: 10/04/2018: BUN 30; Creatinine, Ser 0.85; Potassium 4.7; Sodium 142; TSH 2.820   Recent Lipid Panel No results found for: CHOL, TRIG, HDL, CHOLHDL, LDLCALC, LDLDIRECT  Wt Readings from Last 3 Encounters:  12/10/18 112 lb (50.8 kg)  11/05/18 112 lb (50.8 kg)  10/04/18 110 lb (49.9 kg)     Objective:    Vital Signs:  BP (!) 153/59 (BP Location: Left Arm, Patient Position: Sitting, Cuff Size: Normal)   Pulse (!) 59   Ht 5\' 2"  (1.575 m)   Wt 112 lb (50.8 kg)   BMI 20.49 kg/m    VITAL SIGNS:  reviewed  ASSESSMENT & PLAN:    1. Chest discomfort: This is resolved and the patient is very comfortable with activities of daily living and happy about it.  I reassured her. 2. Essential hypertension: Blood pressure is stable and I would prefer her to have a mildly elevated blood pressure in view of her age.  I also wonder that there might be some element of "whitecoat hypertension" in view of the current situation.  So I will not intervene on her medications actively 3. Patient will be seen in follow-up appointment in 4 months or earlier if the patient has any concerns   COVID-19 Education: The signs and symptoms of COVID-19 were discussed with the patient and how to seek care for testing (follow up with PCP or arrange E-visit).  The importance of social distancing was discussed today.  Time:   Today, I have spent 14 minutes with the patient with telehealth technology discussing the above problems.     Medication Adjustments/Labs and Tests Ordered:  Current medicines are reviewed at length with the patient today.  Concerns regarding medicines are outlined above.   Tests Ordered: No orders of the defined types were placed in this encounter.   Medication Changes: No orders of the defined types were placed in this encounter.   Disposition:  Follow up in 4 month(s)  Signed, Garwin Brothers, MD  12/10/2018 11:27 AM     [facility]

## 2018-12-10 NOTE — Patient Instructions (Signed)
Medication Instructions:  Your physician recommends that you continue on your current medications as directed. Please refer to the Current Medication list given to you today.  If you need a refill on your cardiac medications before your next appointment, please call your pharmacy.   Lab work: None If you have labs (blood work) drawn today and your tests are completely normal, you will receive your results only by: . MyChart Message (if you have MyChart) OR . A paper copy in the mail If you have any lab test that is abnormal or we need to change your treatment, we will call you to review the results.  Testing/Procedures: None  Follow-Up: At [facility], you and your health needs are our priority.  As part of our continuing mission to provide you with exceptional heart care, we have created designated Provider Care Teams.  These Care Teams include your primary Cardiologist (physician) and Advanced Practice Providers (APPs -  Physician Assistants and Nurse Practitioners) who all work together to provide you with the care you need, when you need it. You will need a follow up appointment in 4 months.  Any Other Special Instructions Will Be Listed Below (If Applicable).   

## 2018-12-13 ENCOUNTER — Telehealth: Payer: Medicare Other | Admitting: Cardiology

## 2019-04-19 ENCOUNTER — Ambulatory Visit (INDEPENDENT_AMBULATORY_CARE_PROVIDER_SITE_OTHER): Payer: Medicare Other | Admitting: Cardiology

## 2019-04-19 ENCOUNTER — Encounter: Payer: Self-pay | Admitting: Cardiology

## 2019-04-19 ENCOUNTER — Other Ambulatory Visit: Payer: Self-pay

## 2019-04-19 VITALS — BP 160/58 | HR 60 | Temp 97.9°F | Ht 62.0 in | Wt 101.0 lb

## 2019-04-19 DIAGNOSIS — I2510 Atherosclerotic heart disease of native coronary artery without angina pectoris: Secondary | ICD-10-CM

## 2019-04-19 DIAGNOSIS — I480 Paroxysmal atrial fibrillation: Secondary | ICD-10-CM | POA: Diagnosis not present

## 2019-04-19 DIAGNOSIS — E782 Mixed hyperlipidemia: Secondary | ICD-10-CM | POA: Diagnosis not present

## 2019-04-19 DIAGNOSIS — Z1329 Encounter for screening for other suspected endocrine disorder: Secondary | ICD-10-CM

## 2019-04-19 NOTE — Addendum Note (Signed)
Addended by: Beckey Rutter on: 04/19/2019 03:18 PM   Modules accepted: Orders

## 2019-04-19 NOTE — Addendum Note (Signed)
Addended by: Beckey Rutter on: 04/19/2019 03:19 PM   Modules accepted: Orders

## 2019-04-19 NOTE — Progress Notes (Signed)
Cardiology Office Note:    Date:  04/19/2019   ID:  Jordan [name], DOB 1916/05/06, MRN [id number]  PCP:  Jordan Laughter, MD  Cardiologist:  Garwin Brothers, MD   Referring MD: Jordan Laughter, MD    ASSESSMENT:    1. Paroxysmal atrial fibrillation (HCC)   2. Coronary artery disease involving native coronary artery of native heart without angina pectoris   3. Mixed hyperlipidemia    PLAN:    In order of problems listed above:  1. Paroxysmal atrial fibrillation:I discussed with the patient atrial fibrillation, disease process. Management and therapy including rate and rhythm control, anticoagulation benefits and potential risks were discussed extensively with the patient. Patient had multiple questions which were answered to patient's satisfaction. 2. Patient is not on anticoagulation because of frail status and unstable gait.  For the amiodarone she will have a Chem-7 TSH and liver panel done today. 3. Patient will be seen in follow-up appointment in 6 months or earlier if the patient has any concerns    Medication Adjustments/Labs and Tests Ordered: Current medicines are reviewed at length with the patient today.  Concerns regarding medicines are outlined above.  No orders of the defined types were placed in this encounter.  No orders of the defined types were placed in this encounter.    Chief Complaint  Patient presents with  . Follow-up     History of Present Illness:    Jordan [name] is a 85 y.o. female.  Patient has past medical history of paroxysmal atrial fibrillation.  She is on amiodarone therapy and tolerating it well.  No chest pain orthopnea or PND.  Her neighbor and friend accompanies her for this visit.  At the time of my evaluation, the patient is alert awake oriented and in no distress.  Past Medical History:  Diagnosis Date  . Aortic insufficiency    MILD TO MODERATE  . Chest pain   . Coronary artery disease   . Dizziness   . Hearing loss   .  Hyperlipidemia   . Hypertension   . Palpitations   . PVC's (premature ventricular contractions)   . SOB (shortness of breath)   . Spinal stenosis     Past Surgical History:  Procedure Laterality Date  . ABDOMINAL HYSTERECTOMY    . APPENDECTOMY    . CARDIAC CATHETERIZATION  01/12/1996   THE EF IS DIFFICULT TO ESTIMATE DUE TO PVCS. SEVERE TWO-VESSEL OBSTRUCTIVE ATHERSCLEROTIC CAD WITH COMPLEX STENOSIS IN THE LAD AND DIAGONAL VESSELS  . CARDIAC CATHETERIZATION  07/29/1991   EF 80-85%  . CORONARY ARTERY BYPASS GRAFT  1997  . INGUINAL HERNIA REPAIR    . OVARIAN CYST REMOVAL    . TONSILLECTOMY AND ADENOIDECTOMY      Current Medications: Current Meds  Medication Sig  . acetaminophen (TYLENOL) 500 MG tablet Take 500 mg by mouth 2 (two) times daily.  Marland Kitchen amiodarone (PACERONE) 100 MG tablet Take 1 tablet (100 mg total) by mouth daily.  Marland Kitchen amLODipine (NORVASC) 5 MG tablet Take 5 mg by mouth daily.  . brimonidine (ALPHAGAN) 0.2 % ophthalmic solution INSTILL 1 DROP INTO RIGHT EYE TWICE DAILY  . Calcium Carbonate-Vit D-Min (CALTRATE 600+D PLUS) 600-400 MG-UNIT per tablet Take 1 tablet by mouth daily.    . dorzolamide-timolol (COSOPT) 22.3-6.8 MG/ML ophthalmic solution INSTILL 1 DROP INTO EACH EYE TWICE DAILY  . fexofenadine (ALLEGRA) 180 MG tablet Take 180 mg by mouth daily.  Marland Kitchen LUMIGAN 0.01 % SOLN INSTILL 1 DROP INTO [facility]  EYE ONCE DAILY AT NIGHT  . Multiple Vitamin (MULTIVITAMIN WITH MINERALS) TABS Take 1 tablet by mouth daily.  . multivitamin-lutein (OCUVITE-LUTEIN) CAPS Take 1 capsule by mouth 2 (two) times daily.  . nitroGLYCERIN (NITROSTAT) 0.4 MG SL tablet Place 0.4 mg under the tongue every 5 (five) minutes as needed. For chest pain  . Nutritional Supplements (ARTHRO-COMPLEX PO) Take 1 tablet by mouth 2 (two) times daily.  . Omega-3 Fatty Acids (FISH OIL) 1000 MG CAPS Take 1,000 mg by mouth daily. Omegaadvance/1000 mg Omega 3S Triglyceride form  . omeprazole (PRILOSEC) 20 MG capsule Take 20  mg by mouth daily.   . vitamin C (ASCORBIC ACID) 500 MG tablet Take 500 mg by mouth daily.      Allergies:   Cafergot, Cephalexin, Clinoril [sulindac], Codeine, Dolobid [diflunisal], Equagesic, Flu virus vaccine, Hyzaar [losartan potassium-hctz], Neosporin [neomycin-polymyxin-gramicidin], Other, Pce [erythromycin base], Prednisone, and Tetracyclines & related   Social History   Socioeconomic History  . Marital status: Single    Spouse name: Not on file  . Number of children: Not on file  . Years of education: Not on file  . Highest education level: Not on file  Occupational History  . Not on file  Social Needs  . Financial resource strain: Not on file  . Food insecurity    Worry: Not on file    Inability: Not on file  . Transportation needs    Medical: Not on file    Non-medical: Not on file  Tobacco Use  . Smoking status: Never Smoker  . Smokeless tobacco: Never Used  Substance and Sexual Activity  . Alcohol use: No  . Drug use: No  . Sexual activity: Not on file  Lifestyle  . Physical activity    Days per week: Not on file    Minutes per session: Not on file  . Stress: Not on file  Relationships  . Social Musician on phone: Not on file    Gets together: Not on file    Attends religious service: Not on file    Active member of club or organization: Not on file    Attends meetings of clubs or organizations: Not on file    Relationship status: Not on file  Other Topics Concern  . Not on file  Social History Narrative  . Not on file     Family History: The patient's family history includes Heart disease in her father and mother; Nephritis in her brother.  ROS:   Please see the history of present illness.    All other systems reviewed and are negative.  EKGs/Labs/Other Studies Reviewed:    The following studies were reviewed today: EKG reveals sinus rhythm and nonspecific ST-T changes   Recent Labs: 10/04/2018: BUN 30; Creatinine, Ser 0.85;  Potassium 4.7; Sodium 142; TSH 2.820  Recent Lipid Panel No results found for: CHOL, TRIG, HDL, CHOLHDL, VLDL, LDLCALC, LDLDIRECT  Physical Exam:    VS:  BP (!) 160/58 (BP Location: Left Arm, Patient Position: Sitting, Cuff Size: Normal)   Pulse 60   Temp 97.9 F (36.6 C)   Ht 5\' 2"  (1.575 m)   Wt 101 lb (45.8 kg)   SpO2 96%   BMI 18.47 kg/m     Wt Readings from Last 3 Encounters:  04/19/19 101 lb (45.8 kg)  12/10/18 112 lb (50.8 kg)  11/05/18 112 lb (50.8 kg)     GEN: Patient is in no acute distress HEENT: Normal NECK: No JVD;  No carotid bruits LYMPHATICS: No lymphadenopathy CARDIAC: Hear sounds regular, 2/6 systolic murmur at the apex. RESPIRATORY:  Clear to auscultation without rales, wheezing or rhonchi  ABDOMEN: Soft, non-tender, non-distended MUSCULOSKELETAL:  No edema; No deformity  SKIN: Warm and dry NEUROLOGIC:  Alert and oriented x 3 PSYCHIATRIC:  Normal affect   Signed, Jenean Lindau, MD  04/19/2019 2:59 PM    [place]

## 2019-04-19 NOTE — Patient Instructions (Addendum)
Medication Instructions:  Your physician recommends that you continue on your current medications as directed. Please refer to the Current Medication list given to you today.  If you need a refill on your cardiac medications before your next appointment, please call your pharmacy.   Lab work: Your physician recommends that you have a BMP, TSh and hepatic drawn today.  If you have labs (blood work) drawn today and your tests are completely normal, you will receive your results only by: Marland Kitchen MyChart Message (if you have MyChart) OR . A paper copy in the mail If you have any lab test that is abnormal or we need to change your treatment, we will call you to review the results.  Testing/Procedures: You had an EKG Performed today  Follow-Up: At [facility], you and your health needs are our priority.  As part of our continuing mission to provide you with exceptional heart care, we have created designated Provider Care Teams.  These Care Teams include your primary Cardiologist (physician) and Advanced Practice Providers (APPs -  Physician Assistants and Nurse Practitioners) who all work together to provide you with the care you need, when you need it. You will need a follow up appointment in 6 months.

## 2019-04-20 LAB — BASIC METABOLIC PANEL
BUN/Creatinine Ratio: 27 (ref 12–28)
BUN: 23 mg/dL (ref 10–36)
CO2: 21 mmol/L (ref 20–29)
Calcium: 9.7 mg/dL (ref 8.7–10.3)
Chloride: 104 mmol/L (ref 96–106)
Creatinine, Ser: 0.86 mg/dL (ref 0.57–1.00)
GFR calc Af Amer: 64 mL/min/{1.73_m2} (ref >59)
GFR calc non Af Amer: 55 mL/min/{1.73_m2} — ABNORMAL LOW (ref >59)
Glucose: 122 mg/dL — ABNORMAL HIGH (ref 65–99)
Potassium: 4.4 mmol/L (ref 3.5–5.2)
Sodium: 141 mmol/L (ref 134–144)

## 2019-04-20 LAB — HEPATIC FUNCTION PANEL
ALT: 13 IU/L (ref 0–32)
AST: 24 IU/L (ref 0–40)
Albumin: 4.5 g/dL (ref 3.5–4.6)
Alkaline Phosphatase: 90 IU/L (ref 39–117)
Bilirubin Total: 0.2 mg/dL (ref 0.0–1.2)
Bilirubin, Direct: 0.08 mg/dL (ref 0.00–0.40)
Total Protein: 7.4 g/dL (ref 6.0–8.5)

## 2019-04-20 LAB — TSH: TSH: 3.89 u[IU]/mL (ref 0.450–4.500)

## 2019-04-25 ENCOUNTER — Encounter: Payer: Self-pay | Admitting: *Deleted

## 2019-05-24 ENCOUNTER — Emergency Department (HOSPITAL_COMMUNITY): Payer: Medicare Other

## 2019-05-24 ENCOUNTER — Encounter (HOSPITAL_COMMUNITY): Payer: Self-pay

## 2019-05-24 ENCOUNTER — Emergency Department (HOSPITAL_COMMUNITY)
Admission: EM | Admit: 2019-05-24 | Discharge: 2019-05-24 | Disposition: A | Discharge status: Home / Self Care | Payer: Medicare Other | Attending: Emergency Medicine | Admitting: Emergency Medicine

## 2019-05-24 ENCOUNTER — Other Ambulatory Visit: Payer: Self-pay

## 2019-05-24 DIAGNOSIS — I2510 Atherosclerotic heart disease of native coronary artery without angina pectoris: Secondary | ICD-10-CM | POA: Diagnosis not present

## 2019-05-24 DIAGNOSIS — S6992XA Unspecified injury of left wrist, hand and finger(s), initial encounter: Secondary | ICD-10-CM | POA: Insufficient documentation

## 2019-05-24 DIAGNOSIS — W010XXA Fall on same level from slipping, tripping and stumbling without subsequent striking against object, initial encounter: Secondary | ICD-10-CM | POA: Insufficient documentation

## 2019-05-24 DIAGNOSIS — R0781 Pleurodynia: Secondary | ICD-10-CM | POA: Diagnosis present

## 2019-05-24 DIAGNOSIS — Y92009 Unspecified place in unspecified non-institutional (private) residence as the place of occurrence of the external cause: Secondary | ICD-10-CM | POA: Diagnosis not present

## 2019-05-24 DIAGNOSIS — S22080A Wedge compression fracture of T11-T12 vertebra, initial encounter for closed fracture: Secondary | ICD-10-CM | POA: Insufficient documentation

## 2019-05-24 DIAGNOSIS — Z79899 Other long term (current) drug therapy: Secondary | ICD-10-CM | POA: Insufficient documentation

## 2019-05-24 DIAGNOSIS — Y9389 Activity, other specified: Secondary | ICD-10-CM | POA: Insufficient documentation

## 2019-05-24 DIAGNOSIS — Y999 Unspecified external cause status: Secondary | ICD-10-CM | POA: Diagnosis not present

## 2019-05-24 DIAGNOSIS — Z951 Presence of aortocoronary bypass graft: Secondary | ICD-10-CM | POA: Insufficient documentation

## 2019-05-24 DIAGNOSIS — S0990XA Unspecified injury of head, initial encounter: Secondary | ICD-10-CM | POA: Diagnosis not present

## 2019-05-24 DIAGNOSIS — I10 Essential (primary) hypertension: Secondary | ICD-10-CM | POA: Insufficient documentation

## 2019-05-24 DIAGNOSIS — W19XXXA Unspecified fall, initial encounter: Secondary | ICD-10-CM

## 2019-05-24 MED ORDER — ACETAMINOPHEN 500 MG PO TABS
1000.0000 mg | ORAL_TABLET | Freq: Once | ORAL | Status: AC
  Administered 2019-05-24: 1000 mg via ORAL
  Filled 2019-05-24: qty 2

## 2019-05-24 NOTE — Discharge Instructions (Addendum)
You are seen today for a fall.  It appears that your head and neck and left shoulder and forearm were normal.  It looks like you have some chronic changes in your left wrist and a possible widening of the joint space there.  We are going to put you in a splint for these findings and have you follow-up with orthopedics.  It was also found that you have a compression fracture in your thoracic spine. This will be treated with tylenol and rest. Thank you for allowing me to care for you today. Please return to the emergency department if you have new or worsening symptoms. Take your medications as instructed.

## 2019-05-24 NOTE — ED Triage Notes (Addendum)
Pt BIB EMS from home. Pt reports falling today while getting off her couch. Pt normally walks with walker and did not have her walker when she was trying to get up. Pt endorses left lower rib pain and left elbow pain. Pt has a home health nurse that normally comes to check on her but she has been exposed to [place], but pt has not been exposed to her since then. Pt A&O x4.

## 2019-05-24 NOTE — ED Provider Notes (Signed)
[facility] DEPT Provider Note   CSN: [id number] Arrival date & time: 05/24/19  1447     History   Chief Complaint No chief complaint on file.   HPI Jordan [name] is a 85 y.o. female.     Patient is a 85 year old female who lives alone with past medical history of paroxysmal atrial fibrillation, hyperlipidemia, hypertension, coronary artery disease presenting to the emergency department for fall.  Patient reports that she had a mechanical fall while trying to get up off the couch this morning.  She was able to crawl to get to the phone to call for help.  Reports that she fell on her left side and is now complaining of left lower rib pain as well as left forearm pain.  She reports "I do not think I broke anything".  She also reports "you do not need to scan my head".  She does report that she did hit her head but it was not hard.  Denies any other symptoms.     Past Medical History:  Diagnosis Date   Aortic insufficiency    MILD TO MODERATE   Chest pain    Coronary artery disease    Dizziness    Hearing loss    Hyperlipidemia    Hypertension    Palpitations    PVC's (premature ventricular contractions)    SOB (shortness of breath)    Spinal stenosis     Patient Active Problem List   Diagnosis Date Noted   Paroxysmal atrial fibrillation (HCC) 10/04/2018   Chest pain    SOB (shortness of breath)    Coronary artery disease    Hyperlipidemia    Hypertension    Aortic insufficiency     Past Surgical History:  Procedure Laterality Date   ABDOMINAL HYSTERECTOMY     APPENDECTOMY     CARDIAC CATHETERIZATION  01/12/1996   THE EF IS DIFFICULT TO ESTIMATE DUE TO PVCS. SEVERE TWO-VESSEL OBSTRUCTIVE ATHERSCLEROTIC CAD WITH COMPLEX STENOSIS IN THE LAD AND DIAGONAL VESSELS   CARDIAC CATHETERIZATION  07/29/1991   EF 80-85%   CORONARY ARTERY BYPASS GRAFT  1997   INGUINAL HERNIA REPAIR     OVARIAN CYST REMOVAL      TONSILLECTOMY AND ADENOIDECTOMY       OB History   No obstetric history on file.      Home Medications    Prior to Admission medications   Medication Sig Start Date End Date Taking? Authorizing Provider  acetaminophen (TYLENOL) 500 MG tablet Take 500 mg by mouth 2 (two) times daily.    [provider]  amiodarone (PACERONE) 100 MG tablet Take 1 tablet (100 mg total) by mouth daily. 11/05/18   Revankar, Reita Cliche, MD  amLODipine (NORVASC) 5 MG tablet Take 5 mg by mouth daily.    [provider]  brimonidine (ALPHAGAN) 0.2 % ophthalmic solution INSTILL 1 DROP INTO RIGHT EYE TWICE DAILY 03/21/19   [provider]  Calcium Carbonate-Vit D-Min (CALTRATE 600+D PLUS) 600-400 MG-UNIT per tablet Take 1 tablet by mouth daily.      [provider]  dorzolamide-timolol (COSOPT) 22.3-6.8 MG/ML ophthalmic solution INSTILL 1 DROP INTO EACH EYE TWICE DAILY 03/21/19   [provider]  fexofenadine (ALLEGRA) 180 MG tablet Take 180 mg by mouth daily.    [provider]  LUMIGAN 0.01 % SOLN INSTILL 1 DROP INTO EACH EYE ONCE DAILY AT NIGHT 02/07/19   [provider]  Multiple Vitamin (MULTIVITAMIN WITH MINERALS) TABS  Take 1 tablet by mouth daily.    [provider]  multivitamin-lutein (OCUVITE-LUTEIN) CAPS Take 1 capsule by mouth 2 (two) times daily.    [provider]  nitroGLYCERIN (NITROSTAT) 0.4 MG SL tablet Place 0.4 mg under the tongue every 5 (five) minutes as needed. For chest pain    [provider]  Nutritional Supplements (ARTHRO-COMPLEX PO) Take 1 tablet by mouth 2 (two) times daily.    [provider]  Omega-3 Fatty Acids (FISH OIL) 1000 MG CAPS Take 1,000 mg by mouth daily. Omegaadvance/1000 mg Omega 3S Triglyceride form    [provider]  omeprazole (PRILOSEC) 20 MG capsule Take 20 mg by mouth daily.     [provider]  vitamin C (ASCORBIC ACID) 500 MG tablet Take 500 mg by mouth  daily.     [provider]    Family History Family History  Problem Relation Age of Onset   Heart disease Mother    Heart disease Father    Nephritis Brother     Social History Social History   Tobacco Use   Smoking status: Never Smoker   Smokeless tobacco: Never Used  Substance Use Topics   Alcohol use: No   Drug use: No     Allergies   Cafergot, Cephalexin, Clinoril [sulindac], Codeine, Dolobid [diflunisal], Equagesic, Flu virus vaccine, Hyzaar [losartan potassium-hctz], Neosporin [neomycin-polymyxin-gramicidin], Other, Pce [erythromycin base], Prednisone, and Tetracyclines & related   Review of Systems Review of Systems  Constitutional: Negative for chills and fever.  Respiratory: Negative for cough and shortness of breath.   Cardiovascular: Negative for chest pain, palpitations and leg swelling.  Musculoskeletal: Positive for arthralgias. Negative for back pain, gait problem, joint swelling, myalgias, neck pain and neck stiffness.  Skin: Negative for rash.  Neurological: Negative for dizziness, syncope and light-headedness.     Physical Exam Updated Vital Signs BP (!) 169/62    Pulse (!) 58    Temp 98 F (36.7 C) (Oral)    Resp 16    SpO2 98%   Physical Exam Vitals signs and nursing note reviewed.  Constitutional:      General: She is not in acute distress.    Appearance: Normal appearance. She is not ill-appearing, toxic-appearing or diaphoretic.     Comments: Pleasant, elderly female  HENT:     Head: Normocephalic and atraumatic. No raccoon eyes, Battle's sign, abrasion, contusion, masses or laceration.     Jaw: There is normal jaw occlusion.     Nose: Nose normal.     Mouth/Throat:     Mouth: Mucous membranes are moist.  Eyes:     Conjunctiva/sclera: Conjunctivae normal.  Neck:     Musculoskeletal: Full passive range of motion without pain. No edema, pain with movement, spinous process tenderness or muscular tenderness.     Trachea:  Trachea normal.  Cardiovascular:     Rate and Rhythm: Normal rate and regular rhythm.  Pulmonary:     Effort: Pulmonary effort is normal.     Breath sounds: Normal breath sounds.  Chest:     Comments: No signs of injury or trauma. Has TTP to the left lower ribs Abdominal:     General: Abdomen is flat. Bowel sounds are normal.     Tenderness: There is no abdominal tenderness. There is no guarding.  Musculoskeletal:     Thoracic back: Normal.     Lumbar back: Normal.     Comments: Grossly normal ROM of lower extremities bilaterally and right arm.  Left forearm with ecchymosis and tenderness of the ulnar shaft. No elbow effusion or signs of trauma. Pain with extension of the left shoulder.  Skin:    General: Skin is warm and dry.  Neurological:     General: No focal deficit present.     Mental Status: She is alert and oriented to person, place, and time.  Psychiatric:        Mood and Affect: Mood normal.        Behavior: Behavior normal.      ED Treatments / Results  Labs (all labs ordered are listed, but only abnormal results are displayed) Labs Reviewed - No data to display  EKG None  Radiology Dg Chest 2 View  Result Date: 05/24/2019 CLINICAL DATA:  Pain following fall EXAM: CHEST - 2 VIEW COMPARISON:  April 20, 2018 FINDINGS: There is atelectatic change in the left base. No edema or consolidation. Heart is mildly enlarged with pulmonary vascularity normal. No adenopathy. Patient is status post median sternotomy with coronary artery bypass grafting. There is aortic atherosclerosis. There is evidence of old trauma with remodeling in the lateral right clavicle. There is superior migration of the left humeral head. There is a wedge fracture of T12, not present on previous study. Milder wedging is noted at T11. There is localized kyphosis at T11-12. IMPRESSION: Anterior wedging at T12 and to a lesser extent at T11, not present on most recent prior chest radiograph. Question  recent/potentially acute fractures. No edema or consolidation. Stable cardiac prominence. Status post coronary artery bypass grafting. Aortic Atherosclerosis (ICD10-I70.0). Superior migration of the left humeral head likely indicates chronic rotator cuff tear. Electronically Signed   By: Bretta Bang III M.D.   On: 05/24/2019 17:18   Dg Forearm Left  Result Date: 05/24/2019 CLINICAL DATA:  Fall.  Left elbow pain. EXAM: LEFT FOREARM - 2 VIEW COMPARISON:  No prior. FINDINGS: Diffuse osteopenia and degenerative change. Scapholunate space prominence noted. Scapholunate dissociation may be present. Deformity noted of the lunate. Fracture cannot be excluded. The lunate is slightly sclerotic, avascular necrosis cannot be excluded. Left wrist series should be considered for further evaluation. Tiny bony density noted adjacent to the distal left radius. This may represent a tiny fracture chip. Age is undetermined. No other focal bony abnormality identified. No radiopaque foreign bodies. IMPRESSION: 1. Diffuse osteopenia and degenerative change. Scapholunate space prominence noted. Scapholunate dissociation may be present. Also noted is deformity of the lunate. Lunate fracture cannot be excluded. The lunate is also noted to be slightly sclerotic, avascular necrosis cannot be excluded. Tiny bony density noted adjacent to the distal left radius. This may represent a tiny fracture chip. Left wrist series suggested for further evaluation of these findings. 2.  Remainder of the forearm appears intact. Electronically Signed   By: Maisie Fus  Register   On: 05/24/2019 17:23   Dg Wrist Complete Left  Result Date: 05/24/2019 CLINICAL DATA:  Fall. EXAM: LEFT WRIST - COMPLETE 3+ VIEW COMPARISON:  Left forearm x-rays from same day. FINDINGS: No definite acute fracture. No dislocation. Widening of the scapholunate interval with severe radiolunate joint space narrowing and proximal migration of the capitate. Severe  osteoarthritis of the first [facility] joint. Osteopenia. Soft tissue swelling about the wrist. Chondrocalcinosis of the TFCC. IMPRESSION: 1. No definite acute osseous abnormality. 2. 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 3. 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 Electronically Signed   By: Obie Dredge M.D.   On: 05/24/2019 18:32   Ct  Head Wo Contrast  Result Date: 05/24/2019 CLINICAL DATA:  Larey Seat from [facility] out she.  Trauma to the head and neck. EXAM: CT HEAD WITHOUT CONTRAST CT CERVICAL SPINE WITHOUT CONTRAST TECHNIQUE: Multidetector CT imaging of the head and cervical spine was performed following the standard protocol without intravenous contrast. Multiplanar CT image reconstructions of the cervical spine were also generated. COMPARISON:  04/09/2016 multiple other prior studies as distant as 01/21/2012 FINDINGS: CT HEAD FINDINGS Brain: Age related volume loss. Moderate chronic small-vessel ischemic changes affecting the hemispheric white matter. Old lacunar infarction left basal ganglia. No evidence of recent infarction, mass lesion, hemorrhage, hydrocephalus or extra-axial collection. Vascular: There is atherosclerotic calcification of the major vessels at the base of the brain. Skull: No skull fracture. Sinuses/Orbits: Sinuses are clear.  Orbits negative. Other: None CT CERVICAL SPINE FINDINGS Alignment: Mild curvature convex to the left. Straightening of the normal cervical lordosis. 2 mm degenerative anterolisthesis C7-T1. Skull base and vertebrae: No acute fracture. Chronic fractures of C1 at the anterior arch and left posterior arch. Soft tissues and spinal canal: No primary soft tissue lesion. See below regarding C1-2 arthropathy. Disc levels: Chronic degenerative changes at the C1-2 articulation with cystic change within C2 and hypertrophic change. Findings likely due to chronic CPPD arthropathy. Degenerative spondylosis and facet osteoarthritis from C2-3 through C6-7.  No apparent compressive canal stenosis throughout that region. Moderate foraminal narrowing. C7-T1 shows facet osteoarthritis with 2 mm of anterolisthesis. No compressive canal stenosis. Chronic right foraminal stenosis. Upper chest: Negative Other: None IMPRESSION: Head CT: No acute or traumatic finding. Age related atrophy and chronic small-vessel ischemic changes. Cervical spine CT: No acute finding. Old fractures of the anterior arch of C1 and the left posterior arch of C1 which were acute in 2016. No change or progression. Chronic arthropathy of the C1-2 articulation, probably secondary to CPPD, similar to previous studies. Chronic degenerative spondylosis and facet arthropathy below that, similar to previous studies. Electronically Signed   By: Paulina Fusi M.D.   On: 05/24/2019 17:38   Ct Cervical Spine Wo Contrast  Result Date: 05/24/2019 CLINICAL DATA:  Larey Seat from [facility] out she.  Trauma to the head and neck. EXAM: CT HEAD WITHOUT CONTRAST CT CERVICAL SPINE WITHOUT CONTRAST TECHNIQUE: Multidetector CT imaging of the head and cervical spine was performed following the standard protocol without intravenous contrast. Multiplanar CT image reconstructions of the cervical spine were also generated. COMPARISON:  04/09/2016 multiple other prior studies as distant as 01/21/2012 FINDINGS: CT HEAD FINDINGS Brain: Age related volume loss. Moderate chronic small-vessel ischemic changes affecting the hemispheric white matter. Old lacunar infarction left basal ganglia. No evidence of recent infarction, mass lesion, hemorrhage, hydrocephalus or extra-axial collection. Vascular: There is atherosclerotic calcification of the major vessels at the base of the brain. Skull: No skull fracture. Sinuses/Orbits: Sinuses are clear.  Orbits negative. Other: None CT CERVICAL SPINE FINDINGS Alignment: Mild curvature convex to the left. Straightening of the normal cervical lordosis. 2 mm degenerative anterolisthesis C7-T1. Skull base  and vertebrae: No acute fracture. Chronic fractures of C1 at the anterior arch and left posterior arch. Soft tissues and spinal canal: No primary soft tissue lesion. See below regarding C1-2 arthropathy. Disc levels: Chronic degenerative changes at the C1-2 articulation with cystic change within C2 and hypertrophic change. Findings likely due to chronic CPPD arthropathy. Degenerative spondylosis and facet osteoarthritis from C2-3 through C6-7. No apparent compressive canal stenosis throughout that region. Moderate foraminal narrowing. C7-T1 shows facet osteoarthritis with 2 mm of anterolisthesis. No  compressive canal stenosis. Chronic right foraminal stenosis. Upper chest: Negative Other: None IMPRESSION: Head CT: No acute or traumatic finding. Age related atrophy and chronic small-vessel ischemic changes. Cervical spine CT: No acute finding. Old fractures of the anterior arch of C1 and the left posterior arch of C1 which were acute in 2016. No change or progression. Chronic arthropathy of the C1-2 articulation, probably secondary to CPPD, similar to previous studies. Chronic degenerative spondylosis and facet arthropathy below that, similar to previous studies. Electronically Signed   By: Paulina Fusi M.D.   On: 05/24/2019 17:38   Dg Shoulder Left  Result Date: 05/24/2019 CLINICAL DATA:  Status post fall with left shoulder pain. EXAM: LEFT SHOULDER - 2+ VIEW COMPARISON:  None. FINDINGS: There is no acute fracture or dislocation. Mild patchy opacity of left lung base is noted. IMPRESSION: No acute fracture or dislocation noted. Mild patchy opacity of left lung base. Electronically Signed   By: Sherian Rein M.D.   On: 05/24/2019 17:21    Procedures Procedures (including critical care time)  Medications Ordered in ED Medications  acetaminophen (TYLENOL) tablet 1,000 mg (has no administration in time range)     Initial Impression / Assessment and Plan / ED Course  I have reviewed the triage vital  signs and the nursing notes.  Pertinent labs & imaging results that were available during my care of the patient were reviewed by me and considered in my medical decision making (see chart for details).  Clinical Course as of May 23 1853  Tue May 24, 2019  [id number] 85 year old with mechanical fall today. Patient a&o x3. Complains of left side pain but no other pain. Not on anticoagulation. Plan to order Ct head and neck as well as xrays of chest and L arm.    [KM]  1842 Patient reports that she is wanting to go home. Patient's neighbor at beside. Patient reports minimal pain. Xray reveal no acute changes on the head CT, neck CT or shoulder xray. Chest xray reports probably T11-T12 compression fracture. This correlates with patient's pain put her pain is also controlled with tylenol. 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 Patient is not having any pain in her wrist at all. Normal exam of the wrist. Discussed with patient as well as her neighbor and Dr. Freida Busman and we will place in a velcro wrist splint and f/u orthopedics. Treat compression fx with tylenol. Patient agrees with plan   [KM]    Clinical Course User Index [KM] Arlyn Dunning, PA-C       Based on review of vitals, medical screening exam, lab work and/or imaging, there does not appear to be an acute, emergent etiology for the patient's symptoms. Counseled pt on good return precautions and encouraged both PCP and ED follow-up as needed.  Prior to discharge, I also discussed incidental imaging findings with patient in detail and advised appropriate, recommended follow-up in detail.  Clinical Impression: 1. Fall, initial encounter   2. Compression fracture of T11 vertebra, initial encounter (HCC)   3. Compression fracture of T12 vertebra, initial encounter (HCC)   4. Injury of left wrist, initial encounter     Disposition: Discharge  Prior to providing a prescription for  a controlled substance, I independently reviewed the patient's recent prescription history on the [place] Controlled Substance Reporting System. The patient had no recent or regular prescriptions and was deemed appropriate for a brief, less than 3 day prescription  of narcotic for acute analgesia.  This note was prepared with assistance of Conservation officer, historic buildings. Occasional wrong-word or sound-a-like substitutions may have occurred due to the inherent limitations of voice recognition software.   Final Clinical Impressions(s) / ED Diagnoses   Final diagnoses:  Fall, initial encounter  Compression fracture of T11 vertebra, initial encounter (HCC)  Compression fracture of T12 vertebra, initial encounter [facility])  Injury of left wrist, initial encounter    ED Discharge Orders    None       Jeral Pinch 05/24/19 1854    Lorre Nick, MD 05/26/19 1344

## 2019-05-24 NOTE — ED Notes (Signed)
Patient transported to X-ray 

## 2019-06-07 ENCOUNTER — Ambulatory Visit: Payer: Medicare Other | Admitting: Orthopaedic Surgery

## 2019-06-08 ENCOUNTER — Ambulatory Visit (INDEPENDENT_AMBULATORY_CARE_PROVIDER_SITE_OTHER): Payer: Medicare Other | Admitting: Orthopaedic Surgery

## 2019-06-08 ENCOUNTER — Encounter: Payer: Self-pay | Admitting: Orthopaedic Surgery

## 2019-06-08 ENCOUNTER — Other Ambulatory Visit: Payer: Self-pay

## 2019-06-08 DIAGNOSIS — M545 Low back pain, unspecified: Secondary | ICD-10-CM | POA: Insufficient documentation

## 2019-06-08 DIAGNOSIS — M25532 Pain in left wrist: Secondary | ICD-10-CM

## 2019-06-08 HISTORY — DX: Low back pain, unspecified: M54.50

## 2019-06-08 HISTORY — DX: Pain in left wrist: M25.532

## 2019-06-08 NOTE — Progress Notes (Signed)
Office Visit Note   Patient: Jordan [name]           Date of Birth: November 17, 1915           MRN: [id number] Visit Date: 06/08/2019              Requested by: Merlene Laughter, MD [address] [place],  [place] [address] PCP: Merlene Laughter, MD   Assessment & Plan: Visit Diagnoses:  1. Acute midline low back pain without sciatica   2. Pain in left wrist     Plan: Impression is #1 questionable acute wedge fractures T11-12.  #2 left wrist first CMC arthritis and early SLAC wrist.  #3 left rib contusions.  In regards to the questionable compression fractures, the patient exhibits no pain and no bony tenderness.  She will continue to treat this conservatively for now with symptomatic treatment.  In regards to her wrist, she is not exhibiting pain there.  She will follow-up with [place] as needed for that.  In regards to her left ribs, she will continue with symptomatic treatment as well.  She will follow-up with [place] as needed.  Follow-Up Instructions: Return if symptoms worsen or fail to improve.   Orders:  No orders of the defined types were placed in this encounter.  No orders of the defined types were placed in this encounter.     Procedures: No procedures performed   Clinical Data: No additional findings.   Subjective: Chief Complaint  Patient presents with  . Left Wrist - Pain  . Middle Back - Fracture  . Chest Pain    LEFT RIB PAIN    HPI patient is a pleasant 85 year old female who comes in today for follow-up from the ED.  She sustained a fall landing on her left side on 05/24/2019.  She was seen in the ED where x-rays of the left wrist, chest and cervical spine were obtained.  X-rays of the left wrist were negative for fracture.  There is evidence of early slack wrist as well as severe first CMC joint osteoarthritis.  Chest x-rays were significant for T11 and T12 anterior wedge compression fractures.  CT scan of the cervical spine was negative.  She is  complaining of mild left rib pain and nothing more.  No chest pain and no shortness of breath.  This has been improving over the past several days.  She has been taking Tylenol with great relief of symptoms.  Review of Systems as detailed in HPI.  All others reviewed and are negative.   Objective: Vital Signs: There were no vitals taken for this visit.  Physical Exam well-developed well-nourished female no acute distress.  Alert and oriented x3.  Ortho Exam examination of her thoracic and lumbar spine reveals no spinous or paraspinous tenderness.  No pain with range of motion of the spine.  Left wrist shows mild diffuse tenderness with flexion and extension.  No point tenderness.  She is neurovascularly intact distally.  She does have mild tenderness along T10-12 anterior ribs.  Specialty Comments:  No specialty comments available.  Imaging: No new imaging   PMFS History: Patient Active Problem List   Diagnosis Date Noted  . Acute midline low back pain without sciatica 06/08/2019  . Pain in left wrist 06/08/2019  . Paroxysmal atrial fibrillation (HCC) 10/04/2018  . Chest pain   . SOB (shortness of breath)   . Coronary artery disease   . Hyperlipidemia   . Hypertension   .  Aortic insufficiency    Past Medical History:  Diagnosis Date  . Aortic insufficiency    MILD TO MODERATE  . Chest pain   . Coronary artery disease   . Dizziness   . Hearing loss   . Hyperlipidemia   . Hypertension   . Palpitations   . PVC's (premature ventricular contractions)   . SOB (shortness of breath)   . Spinal stenosis     Family History  Problem Relation Age of Onset  . Heart disease Mother   . Heart disease Father   . Nephritis Brother     Past Surgical History:  Procedure Laterality Date  . ABDOMINAL HYSTERECTOMY    . APPENDECTOMY    . CARDIAC CATHETERIZATION  01/12/1996   THE EF IS DIFFICULT TO ESTIMATE DUE TO PVCS. SEVERE TWO-VESSEL OBSTRUCTIVE ATHERSCLEROTIC CAD WITH COMPLEX  STENOSIS IN THE LAD AND DIAGONAL VESSELS  . CARDIAC CATHETERIZATION  07/29/1991   EF 80-85%  . CORONARY ARTERY BYPASS GRAFT  1997  . INGUINAL HERNIA REPAIR    . OVARIAN CYST REMOVAL    . TONSILLECTOMY AND ADENOIDECTOMY     Social History   Occupational History  . Not on file  Tobacco Use  . Smoking status: Never Smoker  . Smokeless tobacco: Never Used  Substance and Sexual Activity  . Alcohol use: No  . Drug use: No  . Sexual activity: Not on file

## 2019-08-17 ENCOUNTER — Encounter: Payer: Self-pay | Admitting: Cardiology

## 2019-08-17 ENCOUNTER — Other Ambulatory Visit (INDEPENDENT_AMBULATORY_CARE_PROVIDER_SITE_OTHER): Payer: Medicare Other

## 2019-08-17 ENCOUNTER — Other Ambulatory Visit: Payer: Self-pay

## 2019-08-17 ENCOUNTER — Ambulatory Visit (INDEPENDENT_AMBULATORY_CARE_PROVIDER_SITE_OTHER): Payer: Medicare Other | Admitting: Cardiology

## 2019-08-17 VITALS — BP 132/58 | HR 57 | Wt 98.0 lb

## 2019-08-17 DIAGNOSIS — R55 Syncope and collapse: Secondary | ICD-10-CM | POA: Diagnosis not present

## 2019-08-17 DIAGNOSIS — E782 Mixed hyperlipidemia: Secondary | ICD-10-CM | POA: Diagnosis not present

## 2019-08-17 DIAGNOSIS — I2510 Atherosclerotic heart disease of native coronary artery without angina pectoris: Secondary | ICD-10-CM

## 2019-08-17 DIAGNOSIS — I480 Paroxysmal atrial fibrillation: Secondary | ICD-10-CM

## 2019-08-17 MED ORDER — AMIODARONE HCL 100 MG PO TABS: 100.0000 mg | ORAL_TABLET | ORAL | 2 refills | Status: DC

## 2019-08-17 NOTE — Progress Notes (Signed)
Cardiology Office Note:    Date:  08/17/2019   ID:  Jordan [name], DOB November 19, 1915, MRN [id number]  PCP:  Merlene Laughter, MD  Cardiologist:  Garwin Brothers, MD   Referring MD: Merlene Laughter, MD    ASSESSMENT:    No diagnosis found. PLAN:    In order of problems listed above:  1. Paroxysmal atrial fibrillation: This is stable at this time.  She is on amiodarone therapy.  Because of history of falls and bradycardia I will cut down her amiodarone dose to 50 mg daily.  Today she will have a Chem-7 TSH and liver panel.  In view of bradycardia I want to make sure that she is not having any symptomatic bradycardia and we will do a 2-week ZIO monitor. 2. Essential hypertension: Blood pressure is stable 3. She will be seen in follow-up appointment in a month or earlier if she has any concerns.   Medication Adjustments/Labs and Tests Ordered: Current medicines are reviewed at length with the patient today.  Concerns regarding medicines are outlined above.  No orders of the defined types were placed in this encounter.  No orders of the defined types were placed in this encounter.    No chief complaint on file.    History of Present Illness:    Jordan [name] is a 86 y.o. female.  Patient has past medical history of paroxysmal atrial fibrillation and hypertension.  She denies any problems at this time and takes care of activities of daily living.  No chest pain orthopnea or PND.  She complains of frequent falls.  No history of syncope.  At the time of my evaluation, the patient is alert awake oriented and in no distress.  Past Medical History:  Diagnosis Date  . Aortic insufficiency    MILD TO MODERATE  . Chest pain   . Coronary artery disease   . Dizziness   . Hearing loss   . Hyperlipidemia   . Hypertension   . Palpitations   . PVC's (premature ventricular contractions)   . SOB (shortness of breath)   . Spinal stenosis     Past Surgical History:  Procedure Laterality  Date  . ABDOMINAL HYSTERECTOMY    . APPENDECTOMY    . CARDIAC CATHETERIZATION  01/12/1996   THE EF IS DIFFICULT TO ESTIMATE DUE TO PVCS. SEVERE TWO-VESSEL OBSTRUCTIVE ATHERSCLEROTIC CAD WITH COMPLEX STENOSIS IN THE LAD AND DIAGONAL VESSELS  . CARDIAC CATHETERIZATION  07/29/1991   EF 80-85%  . CORONARY ARTERY BYPASS GRAFT  1997  . INGUINAL HERNIA REPAIR    . OVARIAN CYST REMOVAL    . TONSILLECTOMY AND ADENOIDECTOMY      Current Medications: Current Meds  Medication Sig  . acetaminophen (TYLENOL) 500 MG tablet Take 500 mg by mouth 2 (two) times daily.  Marland Kitchen amiodarone (PACERONE) 100 MG tablet Take 1 tablet (100 mg total) by mouth daily.  Marland Kitchen amLODipine (NORVASC) 5 MG tablet Take 5 mg by mouth daily.  . brimonidine (ALPHAGAN) 0.2 % ophthalmic solution INSTILL 1 DROP INTO RIGHT EYE TWICE DAILY  . Calcium Carbonate-Vit D-Min (CALTRATE 600+D PLUS) 600-400 MG-UNIT per tablet Take 1 tablet by mouth daily.    . dorzolamide-timolol (COSOPT) 22.3-6.8 MG/ML ophthalmic solution INSTILL 1 DROP INTO EACH EYE TWICE DAILY  . fexofenadine (ALLEGRA) 180 MG tablet Take 180 mg by mouth daily.  Marland Kitchen LUMIGAN 0.01 % SOLN INSTILL 1 DROP INTO EACH EYE ONCE DAILY AT NIGHT  . Multiple Vitamin (MULTIVITAMIN WITH MINERALS) TABS Take  1 tablet by mouth daily.  . multivitamin-lutein (OCUVITE-LUTEIN) CAPS Take 1 capsule by mouth 2 (two) times daily.  . nitroGLYCERIN (NITROSTAT) 0.4 MG SL tablet Place 0.4 mg under the tongue every 5 (five) minutes as needed. For chest pain  . Nutritional Supplements (ARTHRO-COMPLEX PO) Take 1 tablet by mouth 2 (two) times daily.  . Omega-3 Fatty Acids (FISH OIL) 1000 MG CAPS Take 1,000 mg by mouth daily. Omegaadvance/1000 mg Omega 3S Triglyceride form  . omeprazole (PRILOSEC) 20 MG capsule Take 20 mg by mouth daily.   . vitamin C (ASCORBIC ACID) 500 MG tablet Take 500 mg by mouth daily.      Allergies:   Cafergot, Cephalexin, Clinoril [sulindac], Codeine, Dolobid [diflunisal], Equagesic, Flu  virus vaccine, Hyzaar [losartan potassium-hctz], Neosporin [neomycin-polymyxin-gramicidin], Other, Pce [erythromycin base], Prednisone, and Tetracyclines & related   Social History   Socioeconomic History  . Marital status: Single    Spouse name: Not on file  . Number of children: Not on file  . Years of education: Not on file  . Highest education level: Not on file  Occupational History  . Not on file  Tobacco Use  . Smoking status: Never Smoker  . Smokeless tobacco: Never Used  Substance and Sexual Activity  . Alcohol use: No  . Drug use: No  . Sexual activity: Not on file  Other Topics Concern  . Not on file  Social History Narrative  . Not on file   Social Determinants of Health   Financial Resource Strain:   . Difficulty of Paying Living Expenses: Not on file  Food Insecurity:   . Worried About Programme researcher, broadcasting/film/video in the Last Year: Not on file  . Ran Out of Food in the Last Year: Not on file  Transportation Needs:   . Lack of Transportation (Medical): Not on file  . Lack of Transportation (Non-Medical): Not on file  Physical Activity:   . Days of Exercise per Week: Not on file  . Minutes of Exercise per Session: Not on file  Stress:   . Feeling of Stress : Not on file  Social Connections:   . Frequency of Communication with Friends and Family: Not on file  . Frequency of Social Gatherings with Friends and Family: Not on file  . Attends Religious Services: Not on file  . Active Member of Clubs or Organizations: Not on file  . Attends Banker Meetings: Not on file  . Marital Status: Not on file     Family History: The patient's family history includes Heart disease in her father and mother; Nephritis in her brother.  ROS:   Please see the history of present illness.    All other systems reviewed and are negative.  EKGs/Labs/Other Studies Reviewed:    The following studies were reviewed today: EKG reveals sinus rhythm first-degree AV block and  nonspecific ST-T changes.   Recent Labs: 04/19/2019: ALT 13; BUN 23; Creatinine, Ser 0.86; Potassium 4.4; Sodium 141; TSH 3.890  Recent Lipid Panel No results found for: CHOL, TRIG, HDL, CHOLHDL, VLDL, LDLCALC, LDLDIRECT  Physical Exam:    VS:  BP (!) 132/58 (BP Location: Left Arm, Patient Position: Sitting, Cuff Size: Normal)   Pulse (!) 57   Wt 98 lb (44.5 kg)   SpO2 97%   BMI 17.92 kg/m     Wt Readings from Last 3 Encounters:  08/17/19 98 lb (44.5 kg)  04/19/19 101 lb (45.8 kg)  12/10/18 112 lb (50.8 kg)  GEN: Patient is in no acute distress HEENT: Normal NECK: No JVD; No carotid bruits LYMPHATICS: No lymphadenopathy CARDIAC: Hear sounds regular, 2/6 systolic murmur at the apex. RESPIRATORY:  Clear to auscultation without rales, wheezing or rhonchi  ABDOMEN: Soft, non-tender, non-distended MUSCULOSKELETAL:  No edema; No deformity  SKIN: Warm and dry NEUROLOGIC:  Alert and oriented x 3 PSYCHIATRIC:  Normal affect   Signed, Jenean Lindau, MD  08/17/2019 2:17 PM    [facility]

## 2019-08-17 NOTE — Patient Instructions (Signed)
Medication Instructions:  Your physician has recommended you make the following change in your medication:   START taking amiodarone 100 mg (1 tablet) every other day  *If you need a refill on your cardiac medications before your next appointment, please call your pharmacy*  Lab Work: Your physician recommends that you have a BMP and TSH drawn today  If you have labs (blood work) drawn today and your tests are completely normal, you will receive your results only by: Marland Kitchen MyChart Message (if you have MyChart) OR . A paper copy in the mail If you have any lab test that is abnormal or we need to change your treatment, we will call you to review the results.  Testing/Procedures: You had an EKG performed today  Your physician has recommended that you wear a ZIO monitor. ZIO monitors are medical devices that record the heart's electrical activity. Doctors most often use these monitors to diagnose arrhythmias. Arrhythmias are problems with the speed or rhythm of the heartbeat. The monitor is a small, portable device. You can wear one while you do your normal daily activities. This is usually used to diagnose what is causing palpitations/syncope (passing out).You will wear this device for 14 days    Follow-Up: At [facility], you and your health needs are our priority.  As part of our continuing mission to provide you with exceptional heart care, we have created designated Provider Care Teams.  These Care Teams include your primary Cardiologist (physician) and Advanced Practice Providers (APPs -  Physician Assistants and Nurse Practitioners) who all work together to provide you with the care you need, when you need it.  Your next appointment:   1 month(s)  The format for your next appointment:   In Person  Provider:   Belva Crome, MD

## 2019-08-18 LAB — BASIC METABOLIC PANEL
BUN/Creatinine Ratio: 24 (ref 12–28)
BUN: 19 mg/dL (ref 10–36)
CO2: 22 mmol/L (ref 20–29)
Calcium: 10.2 mg/dL (ref 8.7–10.3)
Chloride: 103 mmol/L (ref 96–106)
Creatinine, Ser: 0.78 mg/dL (ref 0.57–1.00)
GFR calc Af Amer: 72 mL/min/{1.73_m2} (ref >59)
GFR calc non Af Amer: 62 mL/min/{1.73_m2} (ref >59)
Glucose: 88 mg/dL (ref 65–99)
Potassium: 4.3 mmol/L (ref 3.5–5.2)
Sodium: 139 mmol/L (ref 134–144)

## 2019-08-18 LAB — HEPATIC FUNCTION PANEL
ALT: 14 IU/L (ref 0–32)
AST: 20 IU/L (ref 0–40)
Albumin: 4.6 g/dL (ref 3.5–4.6)
Alkaline Phosphatase: 119 IU/L — ABNORMAL HIGH (ref 39–117)
Bilirubin Total: 0.3 mg/dL (ref 0.0–1.2)
Bilirubin, Direct: 0.1 mg/dL (ref 0.00–0.40)
Total Protein: 7.4 g/dL (ref 6.0–8.5)

## 2019-08-18 LAB — TSH: TSH: 4.33 u[IU]/mL (ref 0.450–4.500)

## 2019-08-19 ENCOUNTER — Telehealth: Payer: Self-pay | Admitting: Cardiology

## 2019-08-19 NOTE — Telephone Encounter (Signed)
Returned patients called and noticed that her speech is slightly slurred. Patient states she has been really tired past few days, unable to do ADL's. Patient states she had a pain on her left side yesterday, confused/foggy brained and this morning she is numb and unable to walk. RN expressed that this is not normal sign and patient needs to be evaluated. RN called patients DPR Kathie Rhodes) who lives 1 hour from patient. RN expressed need for someone to go by and check on Ms. Boodram due to her new symptoms. RN expressed that Kathie Rhodes may need to dial 911 if she feels Ms. Gudger health is declining. Dr. Tomie China passed off his cell number for further questions, if needed.

## 2019-08-19 NOTE — Telephone Encounter (Signed)
New Message  Patient is calling in to speak with Dr. Tomie China or his nurse. Patient states that she has follow up questions from her appointment on 08/17/19. Please give patient a call back to assist.

## 2019-09-13 ENCOUNTER — Other Ambulatory Visit: Payer: Self-pay | Admitting: Cardiology

## 2019-09-15 ENCOUNTER — Ambulatory Visit: Payer: Medicare Other | Admitting: Cardiology

## 2019-09-27 ENCOUNTER — Ambulatory Visit: Payer: Medicare Other | Admitting: Cardiology

## 2019-10-11 ENCOUNTER — Other Ambulatory Visit: Payer: Self-pay

## 2019-10-11 ENCOUNTER — Ambulatory Visit: Payer: Medicare Other | Admitting: Cardiology

## 2019-10-11 ENCOUNTER — Encounter: Payer: Self-pay | Admitting: Cardiology

## 2019-10-11 VITALS — BP 98/56 | HR 56 | Ht 62.0 in | Wt 98.0 lb

## 2019-10-11 DIAGNOSIS — Z1329 Encounter for screening for other suspected endocrine disorder: Secondary | ICD-10-CM

## 2019-10-11 DIAGNOSIS — I351 Nonrheumatic aortic (valve) insufficiency: Secondary | ICD-10-CM

## 2019-10-11 DIAGNOSIS — I2510 Atherosclerotic heart disease of native coronary artery without angina pectoris: Secondary | ICD-10-CM | POA: Diagnosis not present

## 2019-10-11 DIAGNOSIS — I480 Paroxysmal atrial fibrillation: Secondary | ICD-10-CM

## 2019-10-11 MED ORDER — AMLODIPINE BESYLATE 5 MG PO TABS: 2.5000 mg | ORAL_TABLET | Freq: Every day | ORAL | Status: DC

## 2019-10-11 NOTE — Patient Instructions (Addendum)
Medication Instructions:  Your physician has recommended you make the following change in your medication:   Decrease your Amlodipine to 1/2 tablet daily.  *If you need a refill on your cardiac medications before your next appointment, please call your pharmacy*   Lab Work: You had a BMET, CBC, LFT's and TSH done today. If you have labs (blood work) drawn today and your tests are completely normal, you will receive your results only by: Marland Kitchen MyChart Message (if you have MyChart) OR . A paper copy in the mail If you have any lab test that is abnormal or we need to change your treatment, we will call you to review the results.   Testing/Procedures: None ordered   Follow-Up: At [facility], you and your health needs are our priority.  As part of our continuing mission to provide you with exceptional heart care, we have created designated Provider Care Teams.  These Care Teams include your primary Cardiologist (physician) and Advanced Practice Providers (APPs -  Physician Assistants and Nurse Practitioners) who all work together to provide you with the care you need, when you need it.  We recommend signing up for the patient portal called "MyChart".  Sign up information is provided on this After Visit Summary.  MyChart is used to connect with patients for Virtual Visits (Telemedicine).  Patients are able to view lab/test results, encounter notes, upcoming appointments, etc.  Non-urgent messages can be sent to your provider as well.   To learn more about what you can do with MyChart, go to [email or web address].    Your next appointment:   3 month(s)  The format for your next appointment:   In Person  Provider:   Belva Crome, MD   Other Instructions NA

## 2019-10-11 NOTE — Progress Notes (Signed)
Cardiology Office Note:    Date:  10/11/2019   ID:  Jordan [name], DOB 1915-10-21, MRN [id number]  PCP:  Merlene Laughter, MD  Cardiologist:  Garwin Brothers, MD   Referring MD: Merlene Laughter, MD    ASSESSMENT:    1. Aortic valve insufficiency, etiology of cardiac valve disease unspecified   2. Coronary artery disease involving native coronary artery of native heart without angina pectoris   3. Paroxysmal atrial fibrillation (HCC)    PLAN:    In order of problems listed above:  1. Paroxysmal atrial fibrillation:I discussed with the patient atrial fibrillation, disease process. Management and therapy including rate and rhythm control, anticoagulation benefits and potential risks were discussed extensively with the patient. Patient had multiple questions which were answered to patient's satisfaction. 2. She is not on anticoagulation because of frail status and high propensity for fall.  She is taking Pacerone 100 mg daily and she will have blood work today including LFTs 3. Essential hypertension: Blood pressure is borderline and therefore I will cut her amlodipine to 2.5 mg daily. 4. Patient will be seen in follow-up appointment in 3 months or earlier if the patient has any concerns    Medication Adjustments/Labs and Tests Ordered: Current medicines are reviewed at length with the patient today.  Concerns regarding medicines are outlined above.  No orders of the defined types were placed in this encounter.  No orders of the defined types were placed in this encounter.    Chief Complaint  Patient presents with  . Follow-up    2 Months     History of Present Illness:    Jordan [name] is a 86 y.o. female.  She has past medical history of paroxysmal atrial fibrillation, coronary artery disease and essential hypertension.  She denies any problems at this time and takes care of activities of daily living.  She leads a age-appropriate lifestyle.  At the time of my evaluation, the  patient is alert awake oriented and in no distress.  Past Medical History:  Diagnosis Date  . Acute midline low back pain without sciatica 06/08/2019  . Aortic insufficiency    MILD TO MODERATE  . Chest pain   . Coronary artery disease   . Dizziness   . Hearing loss   . Hyperlipidemia   . Hypertension   . Pain in left wrist 06/08/2019  . Palpitations   . Paroxysmal atrial fibrillation (HCC) 10/04/2018  . PVC's (premature ventricular contractions)   . SOB (shortness of breath)   . Spinal stenosis     Past Surgical History:  Procedure Laterality Date  . ABDOMINAL HYSTERECTOMY    . APPENDECTOMY    . CARDIAC CATHETERIZATION  01/12/1996   THE EF IS DIFFICULT TO ESTIMATE DUE TO PVCS. SEVERE TWO-VESSEL OBSTRUCTIVE ATHERSCLEROTIC CAD WITH COMPLEX STENOSIS IN THE LAD AND DIAGONAL VESSELS  . CARDIAC CATHETERIZATION  07/29/1991   EF 80-85%  . CORONARY ARTERY BYPASS GRAFT  1997  . INGUINAL HERNIA REPAIR    . OVARIAN CYST REMOVAL    . TONSILLECTOMY AND ADENOIDECTOMY      Current Medications: Current Meds  Medication Sig  . acetaminophen (TYLENOL) 500 MG tablet Take 500 mg by mouth 2 (two) times daily.  Marland Kitchen amLODipine (NORVASC) 5 MG tablet Take 5 mg by mouth daily.  . brimonidine (ALPHAGAN) 0.2 % ophthalmic solution INSTILL 1 DROP INTO RIGHT EYE TWICE DAILY  . Calcium Carbonate-Vit D-Min (CALTRATE 600+D PLUS) 600-400 MG-UNIT per tablet Take 1 tablet by mouth  daily.    . dorzolamide-timolol (COSOPT) 22.3-6.8 MG/ML ophthalmic solution INSTILL 1 DROP INTO EACH EYE TWICE DAILY  . fexofenadine (ALLEGRA) 180 MG tablet Take 180 mg by mouth daily.  Marland Kitchen LUMIGAN 0.01 % SOLN INSTILL 1 DROP INTO EACH EYE ONCE DAILY AT NIGHT  . Multiple Vitamin (MULTIVITAMIN WITH MINERALS) TABS Take 1 tablet by mouth daily.  . multivitamin-lutein (OCUVITE-LUTEIN) CAPS Take 1 capsule by mouth 2 (two) times daily.  . nitroGLYCERIN (NITROSTAT) 0.4 MG SL tablet Place 0.4 mg under the tongue every 5 (five) minutes as needed.  For chest pain  . Nutritional Supplements (ARTHRO-COMPLEX PO) Take 1 tablet by mouth 2 (two) times daily.  . Omega-3 Fatty Acids (FISH OIL) 1000 MG CAPS Take 1,000 mg by mouth daily. Omegaadvance/1000 mg Omega 3S Triglyceride form  . omeprazole (PRILOSEC) 20 MG capsule Take 20 mg by mouth daily.   Marland Kitchen PACERONE 100 MG tablet Take 1 tablet by mouth once daily  . vitamin C (ASCORBIC ACID) 500 MG tablet Take 500 mg by mouth daily.      Allergies:   Cafergot, Cephalexin, Clinoril [sulindac], Codeine, Dolobid [diflunisal], Equagesic, Flu virus vaccine, Hyzaar [losartan potassium-hctz], Neosporin [neomycin-polymyxin-gramicidin], Other, Pce [erythromycin base], Prednisone, and Tetracyclines & related   Social History   Socioeconomic History  . Marital status: Single    Spouse name: Not on file  . Number of children: Not on file  . Years of education: Not on file  . Highest education level: Not on file  Occupational History  . Not on file  Tobacco Use  . Smoking status: Never Smoker  . Smokeless tobacco: Never Used  Substance and Sexual Activity  . Alcohol use: No  . Drug use: No  . Sexual activity: Not on file  Other Topics Concern  . Not on file  Social History Narrative  . Not on file   Social Determinants of Health   Financial Resource Strain:   . Difficulty of Paying Living Expenses:   Food Insecurity:   . Worried About Charity fundraiser in the Last Year:   . Arboriculturist in the Last Year:   Transportation Needs:   . Film/video editor (Medical):   Marland Kitchen Lack of Transportation (Non-Medical):   Physical Activity:   . Days of Exercise per Week:   . Minutes of Exercise per Session:   Stress:   . Feeling of Stress :   Social Connections:   . Frequency of Communication with Friends and Family:   . Frequency of Social Gatherings with Friends and Family:   . Attends Religious Services:   . Active Member of Clubs or Organizations:   . Attends Archivist Meetings:    Marland Kitchen Marital Status:      Family History: The patient's family history includes Heart disease in her father and mother; Nephritis in her brother.  ROS:   Please see the history of present illness.    All other systems reviewed and are negative.  EKGs/Labs/Other Studies Reviewed:    The following studies were reviewed today: EVENT MONITOR REPORT:   Patient was monitored from 08/17/2019 to 08/31/2019. Indication:                    Syncope and collapse Ordering physician:  Jenean Lindau, MD  Referring physician:        Jenean Lindau, MD    Baseline rhythm: Sinus  Minimum heart rate: 42 BPM.  Average heart rate: 53 BPM.  Maximal heart rate 108 BPM.  Atrial arrhythmia: Few brief atrial runs the longest was 7 beats  Ventricular arrhythmia: Occasional PVCs  Conduction abnormality: None significant  Symptoms: None significant   Conclusion:  Unremarkable event monitoring.  Interpreting  cardiologist: Garwin Brothers, MD  Date: 09/09/2019 12:41 PM   Recent Labs: 08/17/2019: ALT 14; BUN 19; Creatinine, Ser 0.78; Potassium 4.3; Sodium 139; TSH 4.330  Recent Lipid Panel No results found for: CHOL, TRIG, HDL, CHOLHDL, VLDL, LDLCALC, LDLDIRECT  Physical Exam:    VS:  BP (!) 98/56   Pulse (!) 56   Ht 5\' 2"  (1.575 m)   Wt 98 lb (44.5 kg)   SpO2 96%   BMI 17.92 kg/m     Wt Readings from Last 3 Encounters:  10/11/19 98 lb (44.5 kg)  08/17/19 98 lb (44.5 kg)  04/19/19 101 lb (45.8 kg)     GEN: Patient is in no acute distress HEENT: Normal NECK: No JVD; No carotid bruits LYMPHATICS: No lymphadenopathy CARDIAC: Hear sounds regular, 2/6 systolic murmur at the apex. RESPIRATORY:  Clear to auscultation without rales, wheezing or rhonchi  ABDOMEN: Soft, non-tender, non-distended MUSCULOSKELETAL:  No edema; No deformity  SKIN: Warm and dry NEUROLOGIC:  Alert and oriented x 3 PSYCHIATRIC:  Normal affect   Signed, 04/21/19, MD  10/11/2019 11:49 AM     [facility]

## 2019-10-12 LAB — CBC WITH DIFFERENTIAL/PLATELET
Basophils Absolute: 0 10*3/uL (ref 0.0–0.2)
Basos: 1 %
EOS (ABSOLUTE): 0.4 10*3/uL (ref 0.0–0.4)
Eos: 4 %
Hematocrit: 41 % (ref 34.0–46.6)
Hemoglobin: 13.5 g/dL (ref 11.1–15.9)
Immature Grans (Abs): 0 10*3/uL (ref 0.0–0.1)
Immature Granulocytes: 0 %
Lymphocytes Absolute: 2.7 10*3/uL (ref 0.7–3.1)
Lymphs: 31 %
MCH: 31.5 pg (ref 26.6–33.0)
MCHC: 32.9 g/dL (ref 31.5–35.7)
MCV: 96 fL (ref 79–97)
Monocytes Absolute: 0.7 10*3/uL (ref 0.1–0.9)
Monocytes: 8 %
Neutrophils Absolute: 4.9 10*3/uL (ref 1.4–7.0)
Neutrophils: 56 %
Platelets: 366 10*3/uL (ref 150–450)
RBC: 4.28 x10E6/uL (ref 3.77–5.28)
RDW: 12.3 % (ref 11.7–15.4)
WBC: 8.7 10*3/uL (ref 3.4–10.8)

## 2019-10-12 LAB — BASIC METABOLIC PANEL
BUN/Creatinine Ratio: 25 (ref 12–28)
BUN: 22 mg/dL (ref 10–36)
CO2: 21 mmol/L (ref 20–29)
Calcium: 9.9 mg/dL (ref 8.7–10.3)
Chloride: 102 mmol/L (ref 96–106)
Creatinine, Ser: 0.87 mg/dL (ref 0.57–1.00)
GFR calc Af Amer: 63 mL/min/{1.73_m2} (ref >59)
GFR calc non Af Amer: 54 mL/min/{1.73_m2} — ABNORMAL LOW (ref >59)
Glucose: 91 mg/dL (ref 65–99)
Potassium: 4.4 mmol/L (ref 3.5–5.2)
Sodium: 138 mmol/L (ref 134–144)

## 2019-10-12 LAB — HEPATIC FUNCTION PANEL
ALT: 14 IU/L (ref 0–32)
AST: 21 IU/L (ref 0–40)
Albumin: 4.5 g/dL (ref 3.5–4.6)
Alkaline Phosphatase: 88 IU/L (ref 39–117)
Bilirubin Total: 0.3 mg/dL (ref 0.0–1.2)
Bilirubin, Direct: 0.1 mg/dL (ref 0.00–0.40)
Total Protein: 7.6 g/dL (ref 6.0–8.5)

## 2019-10-12 LAB — TSH: TSH: 4.32 u[IU]/mL (ref 0.450–4.500)

## 2019-10-23 ENCOUNTER — Other Ambulatory Visit: Payer: Self-pay

## 2019-10-23 ENCOUNTER — Encounter (HOSPITAL_COMMUNITY): Payer: Self-pay | Admitting: Emergency Medicine

## 2019-10-23 ENCOUNTER — Emergency Department (HOSPITAL_COMMUNITY): Payer: Medicare Other

## 2019-10-23 ENCOUNTER — Inpatient Hospital Stay (HOSPITAL_COMMUNITY)
Admission: EM | Admit: 2019-10-23 | Discharge: 2019-10-26 | DRG: 536 | Disposition: A | Discharge status: Skilled Nursing Facility | Payer: Medicare Other | Attending: Internal Medicine | Admitting: Internal Medicine

## 2019-10-23 DIAGNOSIS — R001 Bradycardia, unspecified: Secondary | ICD-10-CM | POA: Diagnosis present

## 2019-10-23 DIAGNOSIS — Z66 Do not resuscitate: Secondary | ICD-10-CM | POA: Diagnosis present

## 2019-10-23 DIAGNOSIS — M48061 Spinal stenosis, lumbar region without neurogenic claudication: Secondary | ICD-10-CM | POA: Diagnosis present

## 2019-10-23 DIAGNOSIS — E785 Hyperlipidemia, unspecified: Secondary | ICD-10-CM | POA: Diagnosis present

## 2019-10-23 DIAGNOSIS — Z951 Presence of aortocoronary bypass graft: Secondary | ICD-10-CM

## 2019-10-23 DIAGNOSIS — S32119A Unspecified Zone I fracture of sacrum, initial encounter for closed fracture: Secondary | ICD-10-CM | POA: Diagnosis present

## 2019-10-23 DIAGNOSIS — S72009A Fracture of unspecified part of neck of unspecified femur, initial encounter for closed fracture: Secondary | ICD-10-CM

## 2019-10-23 DIAGNOSIS — I10 Essential (primary) hypertension: Secondary | ICD-10-CM | POA: Diagnosis present

## 2019-10-23 DIAGNOSIS — S32509A Unspecified fracture of unspecified pubis, initial encounter for closed fracture: Secondary | ICD-10-CM | POA: Diagnosis not present

## 2019-10-23 DIAGNOSIS — Z8249 Family history of ischemic heart disease and other diseases of the circulatory system: Secondary | ICD-10-CM

## 2019-10-23 DIAGNOSIS — S3282XA Multiple fractures of pelvis without disruption of pelvic ring, initial encounter for closed fracture: Secondary | ICD-10-CM | POA: Diagnosis not present

## 2019-10-23 DIAGNOSIS — D72829 Elevated white blood cell count, unspecified: Secondary | ICD-10-CM | POA: Diagnosis present

## 2019-10-23 DIAGNOSIS — H9190 Unspecified hearing loss, unspecified ear: Secondary | ICD-10-CM | POA: Diagnosis present

## 2019-10-23 DIAGNOSIS — Z20822 Contact with and (suspected) exposure to covid-19: Secondary | ICD-10-CM | POA: Diagnosis present

## 2019-10-23 DIAGNOSIS — W19XXXA Unspecified fall, initial encounter: Secondary | ICD-10-CM | POA: Diagnosis present

## 2019-10-23 DIAGNOSIS — I480 Paroxysmal atrial fibrillation: Secondary | ICD-10-CM | POA: Diagnosis present

## 2019-10-23 DIAGNOSIS — E440 Moderate protein-calorie malnutrition: Secondary | ICD-10-CM | POA: Diagnosis present

## 2019-10-23 DIAGNOSIS — Z79899 Other long term (current) drug therapy: Secondary | ICD-10-CM | POA: Diagnosis not present

## 2019-10-23 DIAGNOSIS — I2510 Atherosclerotic heart disease of native coronary artery without angina pectoris: Secondary | ICD-10-CM | POA: Diagnosis present

## 2019-10-23 DIAGNOSIS — S32509D Unspecified fracture of unspecified pubis, subsequent encounter for fracture with routine healing: Secondary | ICD-10-CM | POA: Diagnosis not present

## 2019-10-23 DIAGNOSIS — W1830XA Fall on same level, unspecified, initial encounter: Secondary | ICD-10-CM | POA: Diagnosis present

## 2019-10-23 LAB — PROTIME-INR
INR: 0.9 (ref 0.8–1.2)
Prothrombin Time: 12.5 seconds (ref 11.4–15.2)

## 2019-10-23 LAB — CBC WITH DIFFERENTIAL/PLATELET
Abs Immature Granulocytes: 0.13 10*3/uL — ABNORMAL HIGH (ref 0.00–0.07)
Basophils Absolute: 0 10*3/uL (ref 0.0–0.1)
Basophils Relative: 0 %
Eosinophils Absolute: 0.2 10*3/uL (ref 0.0–0.5)
Eosinophils Relative: 2 %
HCT: 43.1 % (ref 36.0–46.0)
Hemoglobin: 13.7 g/dL (ref 12.0–15.0)
Immature Granulocytes: 1 %
Lymphocytes Relative: 16 %
Lymphs Abs: 2.2 10*3/uL (ref 0.7–4.0)
MCH: 31.4 pg (ref 26.0–34.0)
MCHC: 31.8 g/dL (ref 30.0–36.0)
MCV: 98.6 fL (ref 80.0–100.0)
Monocytes Absolute: 0.7 10*3/uL (ref 0.1–1.0)
Monocytes Relative: 5 %
Neutro Abs: 10.3 10*3/uL — ABNORMAL HIGH (ref 1.7–7.7)
Neutrophils Relative %: 76 %
Platelets: 307 10*3/uL (ref 150–400)
RBC: 4.37 MIL/uL (ref 3.87–5.11)
RDW: 12.9 % (ref 11.5–15.5)
WBC: 13.5 10*3/uL — ABNORMAL HIGH (ref 4.0–10.5)
nRBC: 0 % (ref 0.0–0.2)

## 2019-10-23 LAB — TYPE AND SCREEN
ABO/RH(D): O POS
Antibody Screen: NEGATIVE

## 2019-10-23 LAB — COMPREHENSIVE METABOLIC PANEL
ALT: 25 U/L (ref 0–44)
AST: 30 U/L (ref 15–41)
Albumin: 4.2 g/dL (ref 3.5–5.0)
Alkaline Phosphatase: 75 U/L (ref 38–126)
Anion gap: 11 (ref 5–15)
BUN: 22 mg/dL (ref 8–23)
CO2: 24 mmol/L (ref 22–32)
Calcium: 10 mg/dL (ref 8.9–10.3)
Chloride: 106 mmol/L (ref 98–111)
Creatinine, Ser: 0.89 mg/dL (ref 0.44–1.00)
GFR calc Af Amer: >60 mL/min (ref >60)
GFR calc non Af Amer: 53 mL/min — ABNORMAL LOW (ref >60)
Glucose, Bld: 106 mg/dL — ABNORMAL HIGH (ref 70–99)
Potassium: 4.1 mmol/L (ref 3.5–5.1)
Sodium: 141 mmol/L (ref 135–145)
Total Bilirubin: 0.4 mg/dL (ref 0.3–1.2)
Total Protein: 7.9 g/dL (ref 6.5–8.1)

## 2019-10-23 MED ORDER — AMLODIPINE BESYLATE 5 MG PO TABS
2.5000 mg | ORAL_TABLET | Freq: Every day | ORAL | Status: DC
  Administered 2019-10-24 – 2019-10-25 (×2): 2.5 mg via ORAL
  Filled 2019-10-23 (×3): qty 1

## 2019-10-23 MED ORDER — BRIMONIDINE TARTRATE 0.2 % OP SOLN
1.0000 [drp] | Freq: Two times a day (BID) | OPHTHALMIC | Status: DC
  Administered 2019-10-24 – 2019-10-25 (×3): 1 [drp] via OPHTHALMIC
  Filled 2019-10-23: qty 5

## 2019-10-23 MED ORDER — PANTOPRAZOLE SODIUM 40 MG PO TBEC
40.0000 mg | DELAYED_RELEASE_TABLET | Freq: Every day | ORAL | Status: DC
  Administered 2019-10-24 – 2019-10-26 (×3): 40 mg via ORAL
  Filled 2019-10-23 (×3): qty 1

## 2019-10-23 MED ORDER — MORPHINE SULFATE (PF) 2 MG/ML IV SOLN
1.0000 mg | INTRAVENOUS | Status: DC | PRN
  Administered 2019-10-23: 1 mg via INTRAVENOUS
  Filled 2019-10-23: qty 1

## 2019-10-23 MED ORDER — ONDANSETRON HCL 4 MG PO TABS: 4.0000 mg | ORAL_TABLET | Freq: Four times a day (QID) | ORAL | Status: DC | PRN

## 2019-10-23 MED ORDER — SENNOSIDES-DOCUSATE SODIUM 8.6-50 MG PO TABS
1.0000 | ORAL_TABLET | Freq: Two times a day (BID) | ORAL | Status: DC
  Administered 2019-10-24 – 2019-10-26 (×5): 1 via ORAL
  Filled 2019-10-23 (×5): qty 1

## 2019-10-23 MED ORDER — SODIUM CHLORIDE 0.9 % IV SOLN: Freq: Once | INTRAVENOUS | Status: AC

## 2019-10-23 MED ORDER — FLUTICASONE PROPIONATE 50 MCG/ACT NA SUSP
1.0000 | Freq: Every day | NASAL | Status: DC | PRN
  Filled 2019-10-23: qty 16

## 2019-10-23 MED ORDER — ASCORBIC ACID 500 MG PO TABS
500.0000 mg | ORAL_TABLET | Freq: Every day | ORAL | Status: DC
  Administered 2019-10-24 – 2019-10-26 (×3): 500 mg via ORAL
  Filled 2019-10-23 (×3): qty 1

## 2019-10-23 MED ORDER — NITROGLYCERIN 0.4 MG SL SUBL: 0.4000 mg | SUBLINGUAL_TABLET | SUBLINGUAL | Status: DC | PRN

## 2019-10-23 MED ORDER — LORATADINE 10 MG PO TABS
10.0000 mg | ORAL_TABLET | Freq: Every day | ORAL | Status: DC
  Administered 2019-10-24 – 2019-10-26 (×3): 10 mg via ORAL
  Filled 2019-10-23 (×3): qty 1

## 2019-10-23 MED ORDER — ENOXAPARIN SODIUM 30 MG/0.3ML ~~LOC~~ SOLN
30.0000 mg | SUBCUTANEOUS | Status: DC
  Administered 2019-10-23 – 2019-10-25 (×3): 30 mg via SUBCUTANEOUS
  Filled 2019-10-23 (×3): qty 0.3

## 2019-10-23 MED ORDER — MORPHINE SULFATE (PF) 2 MG/ML IV SOLN
2.0000 mg | Freq: Once | INTRAVENOUS | Status: AC
  Administered 2019-10-23: 2 mg via INTRAVENOUS
  Filled 2019-10-23: qty 1

## 2019-10-23 MED ORDER — ACETAMINOPHEN 650 MG RE SUPP: 650.0000 mg | Freq: Four times a day (QID) | RECTAL | Status: DC | PRN

## 2019-10-23 MED ORDER — BISACODYL 10 MG RE SUPP: 10.0000 mg | Freq: Every day | RECTAL | Status: DC | PRN

## 2019-10-23 MED ORDER — OXYCODONE HCL 5 MG PO TABS
5.0000 mg | ORAL_TABLET | ORAL | Status: DC | PRN
  Administered 2019-10-25: 5 mg via ORAL
  Filled 2019-10-23: qty 1

## 2019-10-23 MED ORDER — AMIODARONE HCL 100 MG PO TABS
100.0000 mg | ORAL_TABLET | Freq: Every day | ORAL | Status: DC
  Administered 2019-10-24 – 2019-10-26 (×3): 100 mg via ORAL
  Filled 2019-10-23 (×3): qty 1

## 2019-10-23 MED ORDER — POLYETHYLENE GLYCOL 3350 17 G PO PACK: 17.0000 g | PACK | Freq: Every day | ORAL | Status: DC | PRN

## 2019-10-23 MED ORDER — ONDANSETRON HCL 4 MG/2ML IJ SOLN: 4.0000 mg | Freq: Four times a day (QID) | INTRAMUSCULAR | Status: DC | PRN

## 2019-10-23 MED ORDER — LATANOPROST 0.005 % OP SOLN
1.0000 [drp] | Freq: Every day | OPHTHALMIC | Status: DC
  Administered 2019-10-24: 1 [drp] via OPHTHALMIC
  Filled 2019-10-23: qty 2.5

## 2019-10-23 MED ORDER — ACETAMINOPHEN 325 MG PO TABS
650.0000 mg | ORAL_TABLET | Freq: Four times a day (QID) | ORAL | Status: DC | PRN
  Administered 2019-10-25 – 2019-10-26 (×2): 650 mg via ORAL
  Filled 2019-10-23 (×2): qty 2

## 2019-10-23 NOTE — ED Provider Notes (Signed)
Rt pelvis pain post fall. CT LS and pelvis. Pain limited. Will need admission for pain control and ADL assist. Physical Exam  BP (!) 197/57   Pulse 60   Temp 98.6 F (37 C) (Oral)   Resp 18   SpO2 93%   Physical Exam  ED Course/Procedures     Procedures  MDM  Consult: Reviewed with orthopedics Dr. Roda Shutters.  Will review studies.  Likely nonoperative.  Admit to hospitalist service.        Arby Barrette, MD 11/04/19 [phone]

## 2019-10-23 NOTE — ED Triage Notes (Signed)
BIB [facility] - Pt from home fell on right side and now c/o right hip pain. Worse when pt stands or shifts weight to affected side. No deformity noted. EMS reports pt did not hit head and is not on blood thinners. A&O x4.  116/64 60 pulse 18 RR 95% RA 116 CBG

## 2019-10-23 NOTE — Plan of Care (Signed)

## 2019-10-23 NOTE — ED Notes (Signed)
ED TO INPATIENT HANDOFF REPORT  Name/Age/Gender Jordan [name] 86 y.o. female  Code Status    Code Status Orders  (From admission, onward)         Start     Ordered   10/23/19 1758  Do not attempt resuscitation (DNR)  Continuous    Question Answer Comment  In the event of cardiac or respiratory ARREST Do not call a "code blue"   In the event of cardiac or respiratory ARREST Do not perform Intubation, CPR, defibrillation or ACLS   In the event of cardiac or respiratory ARREST Use medication by any route, position, wound care, and other measures to relive pain and suffering. May use oxygen, suction and manual treatment of airway obstruction as needed for comfort.      10/23/19 1757        Code Status History    Date Active Date Inactive Code Status Order ID Comments User Context   10/23/2019 1736 10/23/2019 1757 Full Code [id number]  Aline August, MD ED   Advance Care Planning Activity      Home/SNF/Other Home  Chief Complaint Hip fracture [facility]) [S72.009A]  Level of Care/Admitting Diagnosis ED Disposition    ED Disposition Condition [place]: [facility] [[id number]]  Level of Care: Med-Surg [16]  May admit patient to [facility] or [facility] if equivalent level of care is available:: Yes  Covid Evaluation: Asymptomatic Screening Protocol (No Symptoms)  Diagnosis: Hip fracture [facility]) [[id number]]  Admitting Physician: Aline August [[id number]]  Attending Physician: Aline August [[id number]]  Estimated length of stay: 3 - 4 days  Certification:: I certify this patient will need inpatient services for at least 2 midnights       Medical History Past Medical History:  Diagnosis Date  . Acute midline low back pain without sciatica 06/08/2019  . Aortic insufficiency    MILD TO MODERATE  . Chest pain   . Coronary artery disease   . Dizziness   . Hearing loss   . Hyperlipidemia   . Hypertension   . Pain in left wrist 06/08/2019   . Palpitations   . Paroxysmal atrial fibrillation ([place]) 10/04/2018  . PVC's (premature ventricular contractions)   . SOB (shortness of breath)   . Spinal stenosis     Allergies Allergies  Allergen Reactions  . Cafergot     unk  . Cephalexin     Unknown   . Clinoril [Sulindac]     Unknown   . Codeine     Unknown   . Dolobid [Diflunisal]     Unknown   . Equagesic     Unknown   . Flu Virus Vaccine     Unknown   . Hyzaar [Losartan Potassium-Hctz]     Unknown   . Neosporin [Neomycin-Polymyxin-Gramicidin]     Unknown   . Other     BEE STING  . Pce [Erythromycin Base]     Unknown   . Prednisone     Unknown   . Tetracyclines & Related     Unknown     IV Location/Drains/Wounds Patient Lines/Drains/Airways Status   Active Line/Drains/Airways    Name:   Placement date:   Placement time:   Site:   Days:   Peripheral IV 10/23/19 Right Antecubital   10/23/19    1312    Antecubital   less than 1          Labs/Imaging Results for orders placed or performed during  the hospital encounter of 10/23/19 (from the past 48 hour(s))  Type and screen [facility]     Status: None   Collection Time: 10/23/19  1:13 PM  Result Value Ref Range   ABO/RH(D) O POS    Antibody Screen NEG    Sample Expiration      10/26/2019,2359 Performed at [facility] [address] [place], [place] [address]   CBC WITH DIFFERENTIAL     Status: Abnormal   Collection Time: 10/23/19  1:14 PM  Result Value Ref Range   WBC 13.5 (H) 4.0 - 10.5 K/uL   RBC 4.37 3.87 - 5.11 MIL/uL   Hemoglobin 13.7 12.0 - 15.0 g/dL   HCT 94.7 09.6 - 28.3 %   MCV 98.6 80.0 - 100.0 fL   MCH 31.4 26.0 - 34.0 pg   MCHC 31.8 30.0 - 36.0 g/dL   RDW 66.2 94.7 - 65.4 %   Platelets 307 150 - 400 K/uL   nRBC 0.0 0.0 - 0.2 %   Neutrophils Relative % 76 %   Neutro Abs 10.3 (H) 1.7 - 7.7 K/uL   Lymphocytes Relative 16 %   Lymphs Abs 2.2 0.7 - 4.0 K/uL   Monocytes Relative 5 %    Monocytes Absolute 0.7 0.1 - 1.0 K/uL   Eosinophils Relative 2 %   Eosinophils Absolute 0.2 0.0 - 0.5 K/uL   Basophils Relative 0 %   Basophils Absolute 0.0 0.0 - 0.1 K/uL   Immature Granulocytes 1 %   Abs Immature Granulocytes 0.13 (H) 0.00 - 0.07 K/uL    Comment: Performed at [facility], [address], [place], [place] [address]  Protime-INR     Status: None   Collection Time: 10/23/19  1:14 PM  Result Value Ref Range   Prothrombin Time 12.5 11.4 - 15.2 seconds   INR 0.9 0.8 - 1.2    Comment: (NOTE) INR goal varies based on device and disease states. Performed at [facility], [address], [place], [place] [address]   Comprehensive metabolic panel     Status: Abnormal   Collection Time: 10/23/19  1:14 PM  Result Value Ref Range   Sodium 141 135 - 145 mmol/L   Potassium 4.1 3.5 - 5.1 mmol/L   Chloride 106 98 - 111 mmol/L   CO2 24 22 - 32 mmol/L   Glucose, Bld 106 (H) 70 - 99 mg/dL    Comment: Glucose reference range applies only to samples taken after fasting for at least 8 hours.   BUN 22 8 - 23 mg/dL   Creatinine, Ser 1.27 0.44 - 1.00 mg/dL   Calcium 51.7 8.9 - 00.1 mg/dL   Total Protein 7.9 6.5 - 8.1 g/dL   Albumin 4.2 3.5 - 5.0 g/dL   AST 30 15 - 41 U/L   ALT 25 0 - 44 U/L   Alkaline Phosphatase 75 38 - 126 U/L   Total Bilirubin 0.4 0.3 - 1.2 mg/dL   GFR calc non Af Amer 53 (L) >60 mL/min   GFR calc Af Amer >60 >60 mL/min   Anion gap 11 5 - 15    Comment: Performed at [facility], [address], [place], [place] [address]  ABO/Rh     Status: None (Preliminary result)   Collection Time: 10/23/19  1:14 PM  Result Value Ref Range   ABO/RH(D)      O POS Performed at [facility], [address], [place], [place] [address]    DG Chest 1 View  Result Date: 10/23/2019 CLINICAL DATA:  Patient with anterior hip pain status post fall. EXAM: CHEST  1 VIEW COMPARISON:  Chest radiograph  05/24/2019. FINDINGS: Monitoring leads overlie the patient. Stable enlarged cardiac and mediastinal contours status post median sternotomy. No large area of pulmonary consolidation. No pleural effusion or pneumothorax. Leftward curvature thoracic spine. Degenerative changes. IMPRESSION: No acute cardiopulmonary process. Electronically Signed   By: Annia Belt M.D.   On: 10/23/2019 14:04   CT LUMBAR SPINE WO CONTRAST  Result Date: 10/23/2019 CLINICAL DATA:  Low back pain and right hip pain secondary to a fall at home. EXAM: CT LUMBAR SPINE WITHOUT CONTRAST TECHNIQUE: Multidetector CT imaging of the lumbar spine was performed without intravenous contrast administration. Multiplanar CT image reconstructions were also generated. COMPARISON:  Lumbar radiographs dated 01/21/2012 and chest x-ray dated 05/24/2019 and 04/20/2018 FINDINGS: Segmentation: 5 lumbar type vertebrae. Alignment: 2 mm retrolisthesis of L2 on L3. 2 mm anterolisthesis at L3-4 and L4-5. 3 mm protrusion of posterosuperior aspect of the T12 vertebral body into the spinal canal secondary to the old compression fracture. Mild compound thoracolumbar scoliosis. Vertebrae: There is an old moderately severe compression fracture of T12, unchanged since 05/24/2019. Slight protrusion of the posterosuperior aspect of T12 into the spinal canal without significant compression of the thecal sac. The fracture does not involve the posterior elements. There is what is probably an acute fracture of the anterior inferior aspect of the right sacral ala. There is a more subtle fracture of the superior aspect of the right sacral ala. Paraspinal and other soft tissues: Aortic atherosclerosis. Small stones in the left kidney. 2.7 cm cyst on the lower pole of the left kidney. Disc levels: T10-11: No significant abnormality. T11-12: Disc degeneration. No disc bulging or protrusion. Old benign compression fracture of T12 with slight protrusion of bone into the spinal canal  without focal neural impingement. T12-L1: Small calcified disc bulge to the left of midline with a bulge in and lateral to the left neural foramen without neural impingement. L1-2: Narrowing of the right side of the L1-2 disc space. No disc bulging or protrusion. L2-3: Disc degeneration. Small broad-based disc bulge with a protrusion into the right lateral recess and right neural foramen without neural impingement. Minimal retrolisthesis of L2 on L3. L3-4: Disc degeneration. 2 mm spondylolisthesis. Small broad-based soft disc protrusion. Hypertrophy of the ligamentum flavum and facet joints combine to create fairly severe spinal stenosis. There is a protrusion into the left neural foramen which could affect the left L3 nerve, image 68 of series 4. Prominent degenerative changes between the spinous processes of L3 and L4. L4-5: 2 mm spondylolisthesis. Disc space narrowing with a vacuum phenomenon. Small broad-based disc bulge. Hypertrophy of the ligamentum flavum and facet joints combine to create mild spinal stenosis there is moderate impingement upon the left lateral recess which could affect the left L5 nerve. No foraminal stenosis. Prominent degenerative changes between the spinous processes of L4 on L5. L5-S1: Moderate right and slight left facet arthritis. No disc bulging or protrusion. IMPRESSION: 1. Acute fracture 2. Moderately severe spinal stenosis at L3-4. 3. Left lateral recess stenosis at L4-5. This could affect the left L5 nerve. 4. Old benign compression fracture of T12. Aortic Atherosclerosis (ICD10-I70.0). Electronically Signed   By: Francene Boyers M.D.   On: 10/23/2019 15:05   CT PELVIS WO CONTRAST  Result Date: 10/23/2019 CLINICAL DATA:  Right pelvic tenderness after a  fall. Right hip pain. EXAM: CT PELVIS WITHOUT CONTRAST TECHNIQUE: Multidetector CT imaging of the pelvis was performed following the standard protocol without intravenous contrast. COMPARISON:  Pelvic radiograph dated 10/23/2019  FINDINGS: Musculoskeletal: There is a comminuted displaced fracture through the right pubic body and of the medial aspects of the right superior and inferior pubic rami with displacement. There is hemorrhage into the adjacent soft tissues without a discrete hematoma. This extends around the anterior inferior aspect of the bladder. There is also a vertical fracture through the right sacral ala. The proximal femurs are intact. No dislocation. Urinary Tract: No abnormality visualized. Bleeding from the pelvic fractures around the anterior inferior aspect of the bladder. Bowel:  Unremarkable visualized pelvic bowel loops. Vascular/Lymphatic: Extensive aortic atherosclerosis. No adenopathy. Reproductive:  Hysterectomy. No adnexal masses. Other:  None IMPRESSION: 1. Comminuted displaced fractures of the right pubic body and medial aspects of the right superior and inferior pubic rami. 2. Vertical fracture through the right sacral ala. 3. Hemorrhage into the soft tissues adjacent to the symphysis pubis without a discrete hematoma. Aortic Atherosclerosis (ICD10-I70.0). Electronically Signed   By: Francene BoyersJames  Maxwell M.D.   On: 10/23/2019 15:11   DG Hip Unilat W or Wo Pelvis 2-3 Views Right  Result Date: 10/23/2019 CLINICAL DATA:  Fall with right-sided hip pain. EXAM: DG HIP (WITH OR WITHOUT PELVIS) 2-3V RIGHT COMPARISON:  None. FINDINGS: No evidence of right-sided hip fracture or dislocation. There is potentially some mild irregularity involving the right superior pubic ramus and a superior pubic ramus fracture cannot be excluded. No evidence of pelvic diastasis. No bony lesions identified. There is mild osteoarthritis of the right hip joint. IMPRESSION: No evidence of right hip fracture. Possible right superior pubic ramus fracture. Electronically Signed   By: Irish LackGlenn  Yamagata M.D.   On: 10/23/2019 14:06    Pending Labs Unresulted Labs (From admission, onward)    Start     Ordered   10/30/19 0500  Creatinine, serum   (enoxaparin (LOVENOX)    CrCl >/= 30 ml/min)  Weekly,   R    Comments: while on enoxaparin therapy    10/23/19 1735   10/24/19 0500  CBC  Tomorrow morning,   R     10/23/19 1735   10/24/19 0500  Basic metabolic panel  Tomorrow morning,   R     10/23/19 1735   10/24/19 0500  Magnesium  Tomorrow morning,   R     10/23/19 1735   10/23/19 1613  SARS CORONAVIRUS 2 (TAT 6-24 HRS) Nasopharyngeal Nasopharyngeal Swab  (Tier 3 (TAT 6-24 hrs))  Once,   STAT    Question Answer Comment  Is this test for diagnosis or screening Screening   Symptomatic for COVID-19 as defined by CDC No   Hospitalized for COVID-19 No   Admitted to ICU for COVID-19 No   Previously tested for COVID-19 No   Resident in a congregate (group) care setting No   Employed in healthcare setting No   Pregnant No      10/23/19 1613          Vitals/Pain Today's Vitals   10/23/19 1800 10/23/19 1830 10/23/19 1850 10/23/19 1900  BP: (!) 152/57 (!) 133/49  (!) 133/51  Pulse: 62   (!) 57  Resp: 19 (!) 22  19  Temp:      TempSrc:      SpO2: 96%   91%  PainSc:   5      Isolation Precautions No active isolations  Medications Medications  amLODipine (NORVASC) tablet 2.5 mg (has no administration in time range)  nitroGLYCERIN (NITROSTAT) SL tablet 0.4 mg (has no administration in time range)  amiodarone (PACERONE) tablet 100 mg (has no administration in time range)  pantoprazole (PROTONIX) EC tablet 40 mg (has no administration in time range)  ascorbic acid (VITAMIN C) tablet 500 mg (has no administration in time range)  loratadine (CLARITIN) tablet 10 mg (has no administration in time range)  fluticasone (FLONASE) 50 MCG/ACT nasal spray 1-2 spray (has no administration in time range)  brimonidine (ALPHAGAN) 0.2 % ophthalmic solution 1 drop (has no administration in time range)  latanoprost (XALATAN) 0.005 % ophthalmic solution 1 drop (has no administration in time range)  enoxaparin (LOVENOX) injection 40 mg (has no  administration in time range)  acetaminophen (TYLENOL) tablet 650 mg (has no administration in time range)    Or  acetaminophen (TYLENOL) suppository 650 mg (has no administration in time range)  oxyCODONE (Oxy IR/ROXICODONE) immediate release tablet 5 mg (has no administration in time range)  senna-docusate (Senokot-S) tablet 1 tablet (has no administration in time range)  polyethylene glycol (MIRALAX / GLYCOLAX) packet 17 g (has no administration in time range)  bisacodyl (DULCOLAX) suppository 10 mg (has no administration in time range)  morphine 2 MG/ML injection 1 mg (has no administration in time range)  ondansetron (ZOFRAN) tablet 4 mg (has no administration in time range)    Or  ondansetron (ZOFRAN) injection 4 mg (has no administration in time range)  0.9 %  sodium chloride infusion ( Intravenous New Bag/Given (Non-Interop) 10/23/19 1659)  morphine 2 MG/ML injection 2 mg (2 mg Intravenous Given 10/23/19 1709)    Mobility walks with device

## 2019-10-23 NOTE — H&P (Signed)
History and Physical    Jordan [name]:[id number] DOB: 03-04-1916 DOA: 10/23/2019  PCP: Merlene Laughter, MD   Patient coming from: Home  I have personally briefly reviewed patient's old medical records in [facility]  Chief Complaint: Fall and hip pain  HPI: Jordan [name] is a 86 y.o. female with medical history significant of hypertension, hyperlipidemia, paroxysmal A. fib not on anticoagulation probably secondary to age, hearing impairment, coronary disease, aortic insufficiency presented with fall and hip pain today.  Patient is very hard of hearing and a poor historian.  I have reviewed patient's medical records including ER providers documentation and spoken to patient's friend at bedside.  Patient lives at home alone and apparently fell today.  Since then, she has been having hip pain and inability to ambulate.  Cannot elaborate on the pain.  No chest pain, worsening shortness of breath, fever, nausea, vomiting, headache, loss of consciousness, trauma to head reported.  ED Course: CT of the pelvis showed comminuted displaced fractures of the right pubic body and medial aspects of the right superior and inferior pubic rami along with vertical fracture of the right sacral ala along with hemorrhage into the soft tissues adjacent to the symphysis pubis without a discrete hematoma.  Orthopedics/Dr. Roda Shutters was consulted who recommended conservative management.  Hospitalist service was called to evaluate the patient.  Review of Systems: Could not be assessed because of patient being a very poor historian and very hard of hearing.   Past Medical History:  Diagnosis Date  . Acute midline low back pain without sciatica 06/08/2019  . Aortic insufficiency    MILD TO MODERATE  . Chest pain   . Coronary artery disease   . Dizziness   . Hearing loss   . Hyperlipidemia   . Hypertension   . Pain in left wrist 06/08/2019  . Palpitations   . Paroxysmal atrial fibrillation (HCC) 10/04/2018  .  PVC's (premature ventricular contractions)   . SOB (shortness of breath)   . Spinal stenosis     Past Surgical History:  Procedure Laterality Date  . ABDOMINAL HYSTERECTOMY    . APPENDECTOMY    . CARDIAC CATHETERIZATION  01/12/1996   THE EF IS DIFFICULT TO ESTIMATE DUE TO PVCS. SEVERE TWO-VESSEL OBSTRUCTIVE ATHERSCLEROTIC CAD WITH COMPLEX STENOSIS IN THE LAD AND DIAGONAL VESSELS  . CARDIAC CATHETERIZATION  07/29/1991   EF 80-85%  . CORONARY ARTERY BYPASS GRAFT  1997  . INGUINAL HERNIA REPAIR    . OVARIAN CYST REMOVAL    . TONSILLECTOMY AND ADENOIDECTOMY     Social history  reports that she has never smoked. She has never used smokeless tobacco. She reports that she does not drink alcohol or use drugs.  Lives at home alone  Allergies  Allergen Reactions  . Cafergot     unk  . Cephalexin     Unknown   . Clinoril [Sulindac]     Unknown   . Codeine     Unknown   . Dolobid [Diflunisal]     Unknown   . Equagesic     Unknown   . Flu Virus Vaccine     Unknown   . Hyzaar [Losartan Potassium-Hctz]     Unknown   . Neosporin [Neomycin-Polymyxin-Gramicidin]     Unknown   . Other     BEE STING  . Pce [Erythromycin Base]     Unknown   . Prednisone     Unknown   . Tetracyclines & Related  Unknown     Family History  Problem Relation Age of Onset  . Heart disease Mother   . Heart disease Father   . Nephritis Brother     Prior to Admission medications   Medication Sig Start Date End Date Taking? Authorizing Provider  acetaminophen (TYLENOL) 500 MG tablet Take 500 mg by mouth 2 (two) times daily.   Yes [provider]  amLODipine (NORVASC) 5 MG tablet Take 0.5 tablets (2.5 mg total) by mouth daily. 10/11/19  Yes Revankar, Aundra Dubin, MD  brimonidine (ALPHAGAN) 0.2 % ophthalmic solution Place 1 drop into the right eye in the morning and at bedtime.  03/21/19  Yes [provider]  Calcium Carbonate-Vit D-Min (CALTRATE 600+D PLUS) 600-400 MG-UNIT per tablet  Take 1 tablet by mouth daily.     Yes [provider]  diclofenac Sodium (VOLTAREN) 1 % GEL Apply 2 g topically daily as needed for pain. 10/17/19  Yes [provider]  fexofenadine (ALLEGRA) 180 MG tablet Take 180 mg by mouth daily.   Yes [provider]  fluticasone (FLONASE) 50 MCG/ACT nasal spray Place 1-2 sprays into both nostrils daily as needed for allergies. 10/14/19  Yes [provider]  LUMIGAN 0.01 % SOLN Place 1 drop into both eyes at bedtime.  02/07/19  Yes [provider]  Multiple Vitamin (MULTIVITAMIN WITH MINERALS) TABS Take 1 tablet by mouth daily.   Yes [provider]  multivitamin-lutein (OCUVITE-LUTEIN) CAPS Take 1 capsule by mouth 2 (two) times daily.   Yes [provider]  nitroGLYCERIN (NITROSTAT) 0.4 MG SL tablet Place 0.4 mg under the tongue every 5 (five) minutes as needed for chest pain.    Yes [provider]  Nutritional Supplements (ARTHRO-COMPLEX PO) Take 1 tablet by mouth 2 (two) times daily.   Yes [provider]  Omega-3 Fatty Acids (FISH OIL) 1000 MG CAPS Take 1,000 mg by mouth daily. Omegaadvance/1000 mg Omega 3S Triglyceride form   Yes [provider]  omeprazole (PRILOSEC) 20 MG capsule Take 20 mg by mouth daily.    Yes [provider]  PACERONE 100 MG tablet Take 1 tablet by mouth once daily Patient taking differently: Take 100 mg by mouth daily.  09/14/19  Yes Revankar, Aundra Dubin, MD  vitamin C (ASCORBIC ACID) 500 MG tablet Take 500 mg by mouth daily.    Yes [provider]    Physical Exam: Vitals:   10/23/19 1600 10/23/19 1630 10/23/19 1700 10/23/19 1715  BP: (!) 158/56 (!) 152/62 (!) 169/63 (!) 160/61  Pulse: 63 (!) 53 63 60  Resp: (!) 21 (!) 26 (!) 21 13  Temp:      TempSrc:      SpO2: 95% (!) 82% 95% 95%    Constitutional: NAD, calm, comfortable.  Elderly female lying in bed, extremely hard of hearing.  Very thinly built. Vitals:   10/23/19  1600 10/23/19 1630 10/23/19 1700 10/23/19 1715  BP: (!) 158/56 (!) 152/62 (!) 169/63 (!) 160/61  Pulse: 63 (!) 53 63 60  Resp: (!) 21 (!) 26 (!) 21 13  Temp:      TempSrc:      SpO2: 95% (!) 82% 95% 95%   Eyes: PERRL, lids and conjunctivae normal ENMT: Mucous membranes are moist. Posterior pharynx clear of any exudate or lesions. Neck: normal, supple, no masses, no thyromegaly Respiratory: bilateral decreased breath sounds at bases, no wheezing, no crackles. Normal respiratory effort. No accessory muscle use.  Cardiovascular: S1 S2  positive, intermittently bradycardic.  No extremity edema. 2+ pedal pulses.  Abdomen: no tenderness, no masses palpated. No hepatosplenomegaly. Bowel sounds positive.  Musculoskeletal: no clubbing / cyanosis.  Decreased range of motion in bilateral hips.   Skin: no rashes, lesions, ulcers. No induration Neurologic: CN 2-12 grossly intact. Moving extremities. No focal neurologic deficits.  Extremely poor historian.  Very hard of hearing and hardly answers any questions. Psychiatric: Could not be assessed because of mental status and patient being a poor historian.  Awake.  Labs on Admission: I have personally reviewed following labs and imaging studies  CBC: Recent Labs  Lab 10/23/19 1314  WBC 13.5*  NEUTROABS 10.3*  HGB 13.7  HCT 43.1  MCV 98.6  PLT 307   Basic Metabolic Panel: Recent Labs  Lab 10/23/19 1314  NA 141  K 4.1  CL 106  CO2 24  GLUCOSE 106*  BUN 22  CREATININE 0.89  CALCIUM 10.0   GFR: Estimated Creatinine Clearance: 23 mL/min (by C-G formula based on SCr of 0.89 mg/dL). Liver Function Tests: Recent Labs  Lab 10/23/19 1314  AST 30  ALT 25  ALKPHOS 75  BILITOT 0.4  PROT 7.9  ALBUMIN 4.2   No results for input(s): LIPASE, AMYLASE in the last 168 hours. No results for input(s): AMMONIA in the last 168 hours. Coagulation Profile: Recent Labs  Lab 10/23/19 1314  INR 0.9   Cardiac Enzymes: No results for input(s):  CKTOTAL, CKMB, CKMBINDEX, TROPONINI in the last 168 hours. BNP (last 3 results) No results for input(s): PROBNP in the last 8760 hours. HbA1C: No results for input(s): HGBA1C in the last 72 hours. CBG: No results for input(s): GLUCAP in the last 168 hours. Lipid Profile: No results for input(s): CHOL, HDL, LDLCALC, TRIG, CHOLHDL, LDLDIRECT in the last 72 hours. Thyroid Function Tests: No results for input(s): TSH, T4TOTAL, FREET4, T3FREE, THYROIDAB in the last 72 hours. Anemia Panel: No results for input(s): VITAMINB12, FOLATE, FERRITIN, TIBC, IRON, RETICCTPCT in the last 72 hours. Urine analysis: No results found for: COLORURINE, APPEARANCEUR, LABSPEC, PHURINE, GLUCOSEU, HGBUR, BILIRUBINUR, KETONESUR, PROTEINUR, UROBILINOGEN, NITRITE, LEUKOCYTESUR  Radiological Exams on Admission: DG Chest 1 View  Result Date: 10/23/2019 CLINICAL DATA:  Patient with anterior hip pain status post fall. EXAM: CHEST  1 VIEW COMPARISON:  Chest radiograph 05/24/2019. FINDINGS: Monitoring leads overlie the patient. Stable enlarged cardiac and mediastinal contours status post median sternotomy. No large area of pulmonary consolidation. No pleural effusion or pneumothorax. Leftward curvature thoracic spine. Degenerative changes. IMPRESSION: No acute cardiopulmonary process. Electronically Signed   By: Annia Beltrew  Davis M.D.   On: 10/23/2019 14:04   CT LUMBAR SPINE WO CONTRAST  Result Date: 10/23/2019 CLINICAL DATA:  Low back pain and right hip pain secondary to a fall at home. EXAM: CT LUMBAR SPINE WITHOUT CONTRAST TECHNIQUE: Multidetector CT imaging of the lumbar spine was performed without intravenous contrast administration. Multiplanar CT image reconstructions were also generated. COMPARISON:  Lumbar radiographs dated 01/21/2012 and chest x-ray dated 05/24/2019 and 04/20/2018 FINDINGS: Segmentation: 5 lumbar type vertebrae. Alignment: 2 mm retrolisthesis of L2 on L3. 2 mm anterolisthesis at L3-4 and L4-5. 3 mm  protrusion of posterosuperior aspect of the T12 vertebral body into the spinal canal secondary to the old compression fracture. Mild compound thoracolumbar scoliosis. Vertebrae: There is an old moderately severe compression fracture of T12, unchanged since 05/24/2019. Slight protrusion of the posterosuperior aspect of T12 into the spinal canal without significant compression of the thecal sac. The fracture does not involve  the posterior elements. There is what is probably an acute fracture of the anterior inferior aspect of the right sacral ala. There is a more subtle fracture of the superior aspect of the right sacral ala. Paraspinal and other soft tissues: Aortic atherosclerosis. Small stones in the left kidney. 2.7 cm cyst on the lower pole of the left kidney. Disc levels: T10-11: No significant abnormality. T11-12: Disc degeneration. No disc bulging or protrusion. Old benign compression fracture of T12 with slight protrusion of bone into the spinal canal without focal neural impingement. T12-L1: Small calcified disc bulge to the left of midline with a bulge in and lateral to the left neural foramen without neural impingement. L1-2: Narrowing of the right side of the L1-2 disc space. No disc bulging or protrusion. L2-3: Disc degeneration. Small broad-based disc bulge with a protrusion into the right lateral recess and right neural foramen without neural impingement. Minimal retrolisthesis of L2 on L3. L3-4: Disc degeneration. 2 mm spondylolisthesis. Small broad-based soft disc protrusion. Hypertrophy of the ligamentum flavum and facet joints combine to create fairly severe spinal stenosis. There is a protrusion into the left neural foramen which could affect the left L3 nerve, image 68 of series 4. Prominent degenerative changes between the spinous processes of L3 and L4. L4-5: 2 mm spondylolisthesis. Disc space narrowing with a vacuum phenomenon. Small broad-based disc bulge. Hypertrophy of the ligamentum flavum  and facet joints combine to create mild spinal stenosis there is moderate impingement upon the left lateral recess which could affect the left L5 nerve. No foraminal stenosis. Prominent degenerative changes between the spinous processes of L4 on L5. L5-S1: Moderate right and slight left facet arthritis. No disc bulging or protrusion. IMPRESSION: 1. Acute fracture 2. Moderately severe spinal stenosis at L3-4. 3. Left lateral recess stenosis at L4-5. This could affect the left L5 nerve. 4. Old benign compression fracture of T12. Aortic Atherosclerosis (ICD10-I70.0). Electronically Signed   By: Francene Boyers M.D.   On: 10/23/2019 15:05   CT PELVIS WO CONTRAST  Result Date: 10/23/2019 CLINICAL DATA:  Right pelvic tenderness after a fall. Right hip pain. EXAM: CT PELVIS WITHOUT CONTRAST TECHNIQUE: Multidetector CT imaging of the pelvis was performed following the standard protocol without intravenous contrast. COMPARISON:  Pelvic radiograph dated 10/23/2019 FINDINGS: Musculoskeletal: There is a comminuted displaced fracture through the right pubic body and of the medial aspects of the right superior and inferior pubic rami with displacement. There is hemorrhage into the adjacent soft tissues without a discrete hematoma. This extends around the anterior inferior aspect of the bladder. There is also a vertical fracture through the right sacral ala. The proximal femurs are intact. No dislocation. Urinary Tract: No abnormality visualized. Bleeding from the pelvic fractures around the anterior inferior aspect of the bladder. Bowel:  Unremarkable visualized pelvic bowel loops. Vascular/Lymphatic: Extensive aortic atherosclerosis. No adenopathy. Reproductive:  Hysterectomy. No adnexal masses. Other:  None IMPRESSION: 1. Comminuted displaced fractures of the right pubic body and medial aspects of the right superior and inferior pubic rami. 2. Vertical fracture through the right sacral ala. 3. Hemorrhage into the soft  tissues adjacent to the symphysis pubis without a discrete hematoma. Aortic Atherosclerosis (ICD10-I70.0). Electronically Signed   By: Francene Boyers M.D.   On: 10/23/2019 15:11   DG Hip Unilat W or Wo Pelvis 2-3 Views Right  Result Date: 10/23/2019 CLINICAL DATA:  Fall with right-sided hip pain. EXAM: DG HIP (WITH OR WITHOUT PELVIS) 2-3V RIGHT COMPARISON:  None. FINDINGS: No evidence of  right-sided hip fracture or dislocation. There is potentially some mild irregularity involving the right superior pubic ramus and a superior pubic ramus fracture cannot be excluded. No evidence of pelvic diastasis. No bony lesions identified. There is mild osteoarthritis of the right hip joint. IMPRESSION: No evidence of right hip fracture. Possible right superior pubic ramus fracture. Electronically Signed   By: Aletta Edouard M.D.   On: 10/23/2019 14:06     Assessment/Plan  Acute pelvic fracture secondary to fall -Patient presented with a fall and CT of the pelvis shows comminuted displaced fractures of the right pubic body and medial aspects of the right superior and inferior pubic rami along with vertical fracture through the right sacral ala with hemorrhage into the soft tissues adjacent to the symphysis pubis without a discrete hematoma -Orthopedics/Dr.Xu has been consulted by the ED provider who recommended conservative management.  -Fall precautions.  Pain management.  PT/OT eval.  Might need rehab placement  Lumbar spinal stenosis -As evidenced on CT of the lumbar spine.  No evidence of acute fracture -There is old benign compression fracture of T12 -I do not think that patient is a surgical candidate -PT eval Leukocytosis -Probably reactive.  Monitor   Paroxysmal A. Fib -Has intermittent bradycardia.  Not on anticoagulation probably secondary to advanced age.  Continue amiodarone. -Outpatient follow-up with PCP/cardiology  Hypertension -Blood pressure elevated.  Monitor.  Continue  amlodipine  History of coronary artery disease -Stable.  Outpatient follow-up    DVT prophylaxis: Lovenox Code Status: DNR as per discussion with patient's grandson/Paul on phone Family Communication: Spoke to patient's grandson/Paul on phone Disposition Plan: Will depend on clinical improvement, physical therapy evaluation.  Might need rehab placement Consults called: Orthopedics/Dr. Erlinda Hong was called by ER provider Admission status: MedSurg/inpatient  Severity of Illness: The appropriate patient status for this patient is INPATIENT. Inpatient status is judged to be reasonable and necessary in order to provide the required intensity of service to ensure the patient's safety. The patient's presenting symptoms, physical exam findings, and initial radiographic and laboratory data in the context of their chronic comorbidities is felt to place them at high risk for further clinical deterioration. Furthermore, it is not anticipated that the patient will be medically stable for discharge from the hospital within 2 midnights of admission. The following factors support the patient status of inpatient.   " The patient's presenting symptoms include fall and hip pain. " The worrisome physical exam findings include decreased mobility in the hip region. " The initial radiographic and laboratory data are worrisome because of pelvic fracture. " The chronic co-morbidities include paroxysmal A. fib/hypertension/coronary disease.   * I certify that at the point of admission it is my clinical judgment that the patient will require inpatient hospital care spanning beyond 2 midnights from the point of admission due to high intensity of service, high risk for further deterioration and high frequency of surveillance required.Aline August MD [facility]  10/23/2019, 5:36 PM

## 2019-10-23 NOTE — Progress Notes (Signed)
Pt arrived to the floor via stretcher from ED, transferred to bed, purewick in placed- hooked up to suction, skin assessed, scabbed over lacerations noted to bilateral outer lower legs, fluid filled blister on inner right thigh, and small bruise on left shin, +pp, +csm of right leg but pt in a lot of pain with even slight movement, extremely HOH, forgetful, admission completed as able, iv patent in right ac, morphine given, vss, bed alarm on, instructed on call bell use which she understands, will continue to monitor throughout shift.

## 2019-10-23 NOTE — Consult Note (Signed)
ORTHOPAEDIC CONSULTATION  REQUESTING PHYSICIAN: Glade Lloyd, MD  Chief Complaint: Pelvic fractures  HPI: Jordan [name] is a 86 y.o. female who presents with pelvic fx s/p mechanical fall.  Denies LOC, neck pain, abd pain.  Lives alone.  Friend is with her.  Ortho consulted for pelvic fx.  Past Medical History:  Diagnosis Date  . Acute midline low back pain without sciatica 06/08/2019  . Aortic insufficiency    MILD TO MODERATE  . Chest pain   . Coronary artery disease   . Dizziness   . Hearing loss   . Hyperlipidemia   . Hypertension   . Pain in left wrist 06/08/2019  . Palpitations   . Paroxysmal atrial fibrillation (HCC) 10/04/2018  . PVC's (premature ventricular contractions)   . SOB (shortness of breath)   . Spinal stenosis    Past Surgical History:  Procedure Laterality Date  . ABDOMINAL HYSTERECTOMY    . APPENDECTOMY    . CARDIAC CATHETERIZATION  01/12/1996   THE EF IS DIFFICULT TO ESTIMATE DUE TO PVCS. SEVERE TWO-VESSEL OBSTRUCTIVE ATHERSCLEROTIC CAD WITH COMPLEX STENOSIS IN THE LAD AND DIAGONAL VESSELS  . CARDIAC CATHETERIZATION  07/29/1991   EF 80-85%  . CORONARY ARTERY BYPASS GRAFT  1997  . INGUINAL HERNIA REPAIR    . OVARIAN CYST REMOVAL    . TONSILLECTOMY AND ADENOIDECTOMY     Social History   Socioeconomic History  . Marital status: Single    Spouse name: Not on file  . Number of children: Not on file  . Years of education: Not on file  . Highest education level: Not on file  Occupational History  . Not on file  Tobacco Use  . Smoking status: Never Smoker  . Smokeless tobacco: Never Used  Substance and Sexual Activity  . Alcohol use: No  . Drug use: No  . Sexual activity: Not on file  Other Topics Concern  . Not on file  Social History Narrative  . Not on file   Social Determinants of Health   Financial Resource Strain:   . Difficulty of Paying Living Expenses:   Food Insecurity:   . Worried About Programme researcher, broadcasting/film/video in the Last  Year:   . Barista in the Last Year:   Transportation Needs:   . Freight forwarder (Medical):   Marland Kitchen Lack of Transportation (Non-Medical):   Physical Activity:   . Days of Exercise per Week:   . Minutes of Exercise per Session:   Stress:   . Feeling of Stress :   Social Connections:   . Frequency of Communication with Friends and Family:   . Frequency of Social Gatherings with Friends and Family:   . Attends Religious Services:   . Active Member of Clubs or Organizations:   . Attends Banker Meetings:   Marland Kitchen Marital Status:    Family History  Problem Relation Age of Onset  . Heart disease Mother   . Heart disease Father   . Nephritis Brother    - negative except otherwise stated in the family history section Allergies  Allergen Reactions  . Cafergot     unk  . Cephalexin     Unknown   . Clinoril [Sulindac]     Unknown   . Codeine     Unknown   . Dolobid [Diflunisal]     Unknown   . Equagesic     Unknown   . Flu Virus Vaccine  Unknown   . Hyzaar [Losartan Potassium-Hctz]     Unknown   . Neosporin [Neomycin-Polymyxin-Gramicidin]     Unknown   . Other     BEE STING  . Pce [Erythromycin Base]     Unknown   . Prednisone     Unknown   . Tetracyclines & Related     Unknown    Prior to Admission medications   Medication Sig Start Date End Date Taking? Authorizing Provider  acetaminophen (TYLENOL) 500 MG tablet Take 500 mg by mouth 2 (two) times daily.   Yes [provider]  amLODipine (NORVASC) 5 MG tablet Take 0.5 tablets (2.5 mg total) by mouth daily. 10/11/19  Yes Revankar, Reita Cliche, MD  brimonidine (ALPHAGAN) 0.2 % ophthalmic solution Place 1 drop into the right eye in the morning and at bedtime.  03/21/19  Yes [provider]  Calcium Carbonate-Vit D-Min (CALTRATE 600+D PLUS) 600-400 MG-UNIT per tablet Take 1 tablet by mouth daily.     Yes [provider]  diclofenac Sodium (VOLTAREN) 1 % GEL Apply 2 g topically  daily as needed for pain. 10/17/19  Yes [provider]  fexofenadine (ALLEGRA) 180 MG tablet Take 180 mg by mouth daily.   Yes [provider]  fluticasone (FLONASE) 50 MCG/ACT nasal spray Place 1-2 sprays into both nostrils daily as needed for allergies. 10/14/19  Yes [provider]  LUMIGAN 0.01 % SOLN Place 1 drop into both eyes at bedtime.  02/07/19  Yes [provider]  Multiple Vitamin (MULTIVITAMIN WITH MINERALS) TABS Take 1 tablet by mouth daily.   Yes [provider]  multivitamin-lutein (OCUVITE-LUTEIN) CAPS Take 1 capsule by mouth 2 (two) times daily.   Yes [provider]  nitroGLYCERIN (NITROSTAT) 0.4 MG SL tablet Place 0.4 mg under the tongue every 5 (five) minutes as needed for chest pain.    Yes [provider]  Nutritional Supplements (ARTHRO-COMPLEX PO) Take 1 tablet by mouth 2 (two) times daily.   Yes [provider]  Omega-3 Fatty Acids (FISH OIL) 1000 MG CAPS Take 1,000 mg by mouth daily. Omegaadvance/1000 mg Omega 3S Triglyceride form   Yes [provider]  omeprazole (PRILOSEC) 20 MG capsule Take 20 mg by mouth daily.    Yes [provider]  PACERONE 100 MG tablet Take 1 tablet by mouth once daily Patient taking differently: Take 100 mg by mouth daily.  09/14/19  Yes Revankar, Reita Cliche, MD  vitamin C (ASCORBIC ACID) 500 MG tablet Take 500 mg by mouth daily.    Yes [provider]   DG Chest 1 View  Result Date: 10/23/2019 CLINICAL DATA:  Patient with anterior hip pain status post fall. EXAM: CHEST  1 VIEW COMPARISON:  Chest radiograph 05/24/2019. FINDINGS: Monitoring leads overlie the patient. Stable enlarged cardiac and mediastinal contours status post median sternotomy. No large area of pulmonary consolidation. No pleural effusion or pneumothorax. Leftward curvature thoracic spine. Degenerative changes. IMPRESSION: No acute cardiopulmonary process. Electronically Signed   By: Lovey Newcomer M.D.   On: 10/23/2019 14:04   CT LUMBAR SPINE WO CONTRAST  Result Date: 10/23/2019 CLINICAL DATA:  Low back pain and right hip pain secondary to a fall at home. EXAM: CT LUMBAR SPINE WITHOUT CONTRAST TECHNIQUE: Multidetector CT imaging of the lumbar spine was performed without intravenous contrast administration. Multiplanar CT image reconstructions were also generated. COMPARISON:  Lumbar radiographs dated 01/21/2012 and chest x-ray dated 05/24/2019 and 04/20/2018 FINDINGS: Segmentation: 5 lumbar type vertebrae.  Alignment: 2 mm retrolisthesis of L2 on L3. 2 mm anterolisthesis at L3-4 and L4-5. 3 mm protrusion of posterosuperior aspect of the T12 vertebral body into the spinal canal secondary to the old compression fracture. Mild compound thoracolumbar scoliosis. Vertebrae: There is an old moderately severe compression fracture of T12, unchanged since 05/24/2019. Slight protrusion of the posterosuperior aspect of T12 into the spinal canal without significant compression of the thecal sac. The fracture does not involve the posterior elements. There is what is probably an acute fracture of the anterior inferior aspect of the right sacral ala. There is a more subtle fracture of the superior aspect of the right sacral ala. Paraspinal and other soft tissues: Aortic atherosclerosis. Small stones in the left kidney. 2.7 cm cyst on the lower pole of the left kidney. Disc levels: T10-11: No significant abnormality. T11-12: Disc degeneration. No disc bulging or protrusion. Old benign compression fracture of T12 with slight protrusion of bone into the spinal canal without focal neural impingement. T12-L1: Small calcified disc bulge to the left of midline with a bulge in and lateral to the left neural foramen without neural impingement. L1-2: Narrowing of the right side of the L1-2 disc space. No disc bulging or protrusion. L2-3: Disc degeneration. Small broad-based disc bulge with a protrusion into the right lateral  recess and right neural foramen without neural impingement. Minimal retrolisthesis of L2 on L3. L3-4: Disc degeneration. 2 mm spondylolisthesis. Small broad-based soft disc protrusion. Hypertrophy of the ligamentum flavum and facet joints combine to create fairly severe spinal stenosis. There is a protrusion into the left neural foramen which could affect the left L3 nerve, image 68 of series 4. Prominent degenerative changes between the spinous processes of L3 and L4. L4-5: 2 mm spondylolisthesis. Disc space narrowing with a vacuum phenomenon. Small broad-based disc bulge. Hypertrophy of the ligamentum flavum and facet joints combine to create mild spinal stenosis there is moderate impingement upon the left lateral recess which could affect the left L5 nerve. No foraminal stenosis. Prominent degenerative changes between the spinous processes of L4 on L5. L5-S1: Moderate right and slight left facet arthritis. No disc bulging or protrusion. IMPRESSION: 1. Acute fracture 2. Moderately severe spinal stenosis at L3-4. 3. Left lateral recess stenosis at L4-5. This could affect the left L5 nerve. 4. Old benign compression fracture of T12. Aortic Atherosclerosis (ICD10-I70.0). Electronically Signed   By: Francene Boyers M.D.   On: 10/23/2019 15:05   CT PELVIS WO CONTRAST  Result Date: 10/23/2019 CLINICAL DATA:  Right pelvic tenderness after a fall. Right hip pain. EXAM: CT PELVIS WITHOUT CONTRAST TECHNIQUE: Multidetector CT imaging of the pelvis was performed following the standard protocol without intravenous contrast. COMPARISON:  Pelvic radiograph dated 10/23/2019 FINDINGS: Musculoskeletal: There is a comminuted displaced fracture through the right pubic body and of the medial aspects of the right superior and inferior pubic rami with displacement. There is hemorrhage into the adjacent soft tissues without a discrete hematoma. This extends around the anterior inferior aspect of the bladder. There is also a vertical  fracture through the right sacral ala. The proximal femurs are intact. No dislocation. Urinary Tract: No abnormality visualized. Bleeding from the pelvic fractures around the anterior inferior aspect of the bladder. Bowel:  Unremarkable visualized pelvic bowel loops. Vascular/Lymphatic: Extensive aortic atherosclerosis. No adenopathy. Reproductive:  Hysterectomy. No adnexal masses. Other:  None IMPRESSION: 1. Comminuted displaced fractures of the right pubic body and medial aspects of the right superior and inferior pubic rami. 2. Vertical  fracture through the right sacral ala. 3. Hemorrhage into the soft tissues adjacent to the symphysis pubis without a discrete hematoma. Aortic Atherosclerosis (ICD10-I70.0). Electronically Signed   By: Francene Boyers M.D.   On: 10/23/2019 15:11   DG Hip Unilat W or Wo Pelvis 2-3 Views Right  Result Date: 10/23/2019 CLINICAL DATA:  Fall with right-sided hip pain. EXAM: DG HIP (WITH OR WITHOUT PELVIS) 2-3V RIGHT COMPARISON:  None. FINDINGS: No evidence of right-sided hip fracture or dislocation. There is potentially some mild irregularity involving the right superior pubic ramus and a superior pubic ramus fracture cannot be excluded. No evidence of pelvic diastasis. No bony lesions identified. There is mild osteoarthritis of the right hip joint. IMPRESSION: No evidence of right hip fracture. Possible right superior pubic ramus fracture. Electronically Signed   By: Irish Lack M.D.   On: 10/23/2019 14:06   - pertinent xrays, CT, MRI studies were reviewed and independently interpreted  Positive ROS: All other systems have been reviewed and were otherwise negative with the exception of those mentioned in the HPI and as above.  Physical Exam: General: No acute distress Cardiovascular: No pedal edema Respiratory: No cyanosis, no use of accessory musculature GI: No organomegaly, abdomen is soft and non-tender Skin: No lesions in the area of chief complaint Neurologic:  Sensation intact distally Psychiatric: Patient is at baseline mood and affect Lymphatic: No axillary or cervical lymphadenopathy  MUSCULOSKELETAL:  - moderate pain with lateral compression of pelvis and hips - mild ttp of right superior pubis - NVI distally  Assessment: Right superior pubic fx Right sacral ala fx  Plan: - nondisplaced fractures - will plan to treat nonop - pain control and mobilize with PT - anticipate will need SNF post discharge - f/u in office in 2-4 weeks for repeat xrays - friend updated at bedside  Thank you for the consult and the opportunity to see Ms. Eleonore Chiquito Glee Arvin, MD [facility] 5:41 PM

## 2019-10-23 NOTE — ED Provider Notes (Signed)
[facility]-EMERGENCY DEPT Provider Note   CSN: [id number] Arrival date & time: 10/23/19  1225     History Chief Complaint  Patient presents with  . Fall    Jordan [name] is a 86 y.o. female.  The history is provided by the patient and medical records. No language interpreter was used.  Fall This is a new problem. The current episode started less than 1 hour ago. The problem occurs rarely. The problem has not changed since onset.Pertinent negatives include no chest pain, no abdominal pain, no headaches and no shortness of breath. The symptoms are aggravated by standing. Nothing relieves the symptoms. Jordan [name] has tried nothing for the symptoms. The treatment provided no relief.       Past Medical History:  Diagnosis Date  . Acute midline low back pain without sciatica 06/08/2019  . Aortic insufficiency    MILD TO MODERATE  . Chest pain   . Coronary artery disease   . Dizziness   . Hearing loss   . Hyperlipidemia   . Hypertension   . Pain in left wrist 06/08/2019  . Palpitations   . Paroxysmal atrial fibrillation (HCC) 10/04/2018  . PVC's (premature ventricular contractions)   . SOB (shortness of breath)   . Spinal stenosis     Patient Active Problem List   Diagnosis Date Noted  . Acute midline low back pain without sciatica 06/08/2019  . Pain in left wrist 06/08/2019  . Paroxysmal atrial fibrillation (HCC) 10/04/2018  . Chest pain   . SOB (shortness of breath)   . Coronary artery disease   . Hyperlipidemia   . Hypertension   . Aortic insufficiency     Past Surgical History:  Procedure Laterality Date  . ABDOMINAL HYSTERECTOMY    . APPENDECTOMY    . CARDIAC CATHETERIZATION  01/12/1996   THE EF IS DIFFICULT TO ESTIMATE DUE TO PVCS. SEVERE TWO-VESSEL OBSTRUCTIVE ATHERSCLEROTIC CAD WITH COMPLEX STENOSIS IN THE LAD AND DIAGONAL VESSELS  . CARDIAC CATHETERIZATION  07/29/1991   EF 80-85%  . CORONARY ARTERY BYPASS GRAFT  1997  . INGUINAL HERNIA  REPAIR    . OVARIAN CYST REMOVAL    . TONSILLECTOMY AND ADENOIDECTOMY       OB History   No obstetric history on file.     Family History  Problem Relation Age of Onset  . Heart disease Mother   . Heart disease Father   . Nephritis Brother     Social History   Tobacco Use  . Smoking status: Never Smoker  . Smokeless tobacco: Never Used  Substance Use Topics  . Alcohol use: No  . Drug use: No    Home Medications Prior to Admission medications   Medication Sig Start Date End Date Taking? Authorizing Provider  acetaminophen (TYLENOL) 500 MG tablet Take 500 mg by mouth 2 (two) times daily.    [provider]  amLODipine (NORVASC) 5 MG tablet Take 0.5 tablets (2.5 mg total) by mouth daily. 10/11/19   Revankar, Aundra Dubin, MD  brimonidine (ALPHAGAN) 0.2 % ophthalmic solution INSTILL 1 DROP INTO RIGHT EYE TWICE DAILY 03/21/19   [provider]  Calcium Carbonate-Vit D-Min (CALTRATE 600+D PLUS) 600-400 MG-UNIT per tablet Take 1 tablet by mouth daily.      [provider]  dorzolamide-timolol (COSOPT) 22.3-6.8 MG/ML ophthalmic solution INSTILL 1 DROP INTO EACH EYE TWICE DAILY 03/21/19   [provider]  fexofenadine (ALLEGRA) 180 MG tablet Take 180 mg by mouth daily.  [provider]  LUMIGAN 0.01 % SOLN INSTILL 1 DROP INTO EACH EYE ONCE DAILY AT NIGHT 02/07/19   [provider]  Multiple Vitamin (MULTIVITAMIN WITH MINERALS) TABS Take 1 tablet by mouth daily.    [provider]  multivitamin-lutein (OCUVITE-LUTEIN) CAPS Take 1 capsule by mouth 2 (two) times daily.    [provider]  nitroGLYCERIN (NITROSTAT) 0.4 MG SL tablet Place 0.4 mg under the tongue every 5 (five) minutes as needed. For chest pain    [provider]  Nutritional Supplements (ARTHRO-COMPLEX PO) Take 1 tablet by mouth 2 (two) times daily.    [provider]  Omega-3 Fatty Acids (FISH OIL) 1000 MG CAPS Take 1,000 mg by mouth daily.  Omegaadvance/1000 mg Omega 3S Triglyceride form    [provider]  omeprazole (PRILOSEC) 20 MG capsule Take 20 mg by mouth daily.     [provider]  PACERONE 100 MG tablet Take 1 tablet by mouth once daily 09/14/19   Revankar, Reita Cliche, MD  vitamin C (ASCORBIC ACID) 500 MG tablet Take 500 mg by mouth daily.     [provider]    Allergies    Cafergot, Cephalexin, Clinoril [sulindac], Codeine, Dolobid [diflunisal], Equagesic, Flu virus vaccine, Hyzaar [losartan potassium-hctz], Neosporin [neomycin-polymyxin-gramicidin], Other, Pce [erythromycin base], Prednisone, and Tetracyclines & related  Review of Systems   Review of Systems  Constitutional: Negative for chills, diaphoresis, fatigue and fever.  HENT: Negative for congestion.   Respiratory: Negative for cough, chest tightness, shortness of breath and wheezing.   Cardiovascular: Negative for chest pain.  Gastrointestinal: Negative for abdominal pain, constipation, diarrhea, nausea and vomiting.  Genitourinary: Negative for dysuria and flank pain.  Musculoskeletal: Negative for back pain, neck pain and neck stiffness.  Skin: Negative for wound.  Neurological: Negative for light-headedness and headaches.  Psychiatric/Behavioral: Negative for agitation.  All other systems reviewed and are negative.   Physical Exam Updated Vital Signs BP (!) 197/57   Pulse 60   Temp 98.6 F (37 C) (Oral)   Resp 18   SpO2 93%   Physical Exam Vitals and nursing note reviewed.  Constitutional:      General: Jordan [name] is not in acute distress.    Appearance: Jordan [name] is well-developed. Jordan [name] is not ill-appearing, toxic-appearing or diaphoretic.  HENT:     Head: Normocephalic and atraumatic.     Right Ear: External ear normal.     Left Ear: External ear normal.     Nose: Nose normal. No congestion or rhinorrhea.     Mouth/Throat:     Mouth: Mucous membranes are moist.     Pharynx: No oropharyngeal exudate or posterior  oropharyngeal erythema.  Eyes:     Conjunctiva/sclera: Conjunctivae normal.     Pupils: Pupils are equal, round, and reactive to light.  Cardiovascular:     Rate and Rhythm: Normal rate.     Pulses: Normal pulses.     Heart sounds: No murmur.  Pulmonary:     Effort: No respiratory distress.     Breath sounds: No stridor.  Abdominal:     General: There is no distension.     Tenderness: There is no abdominal tenderness. There is no right CVA tenderness, left CVA tenderness or rebound.  Musculoskeletal:        General: Tenderness and signs of injury present.     Cervical back: Normal range of motion and neck supple.     Right lower leg: No deformity. No edema.  Left lower leg: No deformity. No edema.       Legs:     Comments: Normal sensation and strength in feet. Good pulses in lower extremities. Tenderness in the right pelvis and hip area.  Skin:    General: Skin is warm.     Capillary Refill: Capillary refill takes less than 2 seconds.     Coloration: Skin is not pale.     Findings: No erythema or rash.  Neurological:     General: No focal deficit present.     Mental Status: Jordan [name] is alert and oriented to person, place, and time.     Sensory: No sensory deficit.     Motor: No weakness or abnormal muscle tone.     Deep Tendon Reflexes: Reflexes are normal and symmetric.     ED Results / Procedures / Treatments   Labs (all labs ordered are listed, but only abnormal results are displayed) Labs Reviewed  CBC WITH DIFFERENTIAL/PLATELET - Abnormal; Notable for the following components:      Result Value   WBC 13.5 (*)    Neutro Abs 10.3 (*)    Abs Immature Granulocytes 0.13 (*)    All other components within normal limits  COMPREHENSIVE METABOLIC PANEL - Abnormal; Notable for the following components:   Glucose, Bld 106 (*)    GFR calc non Af Amer 53 (*)    All other components within normal limits  PROTIME-INR  TYPE AND SCREEN  ABO/RH    EKG None  Radiology DG  Chest 1 View  Result Date: 10/23/2019 CLINICAL DATA:  Patient with anterior hip pain status post fall. EXAM: CHEST  1 VIEW COMPARISON:  Chest radiograph 05/24/2019. FINDINGS: Monitoring leads overlie the patient. Stable enlarged cardiac and mediastinal contours status post median sternotomy. No large area of pulmonary consolidation. No pleural effusion or pneumothorax. Leftward curvature thoracic spine. Degenerative changes. IMPRESSION: No acute cardiopulmonary process. Electronically Signed   By: Annia Belt M.D.   On: 10/23/2019 14:04   DG Hip Unilat W or Wo Pelvis 2-3 Views Right  Result Date: 10/23/2019 CLINICAL DATA:  Fall with right-sided hip pain. EXAM: DG HIP (WITH OR WITHOUT PELVIS) 2-3V RIGHT COMPARISON:  None. FINDINGS: No evidence of right-sided hip fracture or dislocation. There is potentially some mild irregularity involving the right superior pubic ramus and a superior pubic ramus fracture cannot be excluded. No evidence of pelvic diastasis. No bony lesions identified. There is mild osteoarthritis of the right hip joint. IMPRESSION: No evidence of right hip fracture. Possible right superior pubic ramus fracture. Electronically Signed   By: Irish Lack M.D.   On: 10/23/2019 14:06    Procedures Procedures (including critical care time)  Medications Ordered in ED Medications - No data to display  ED Course  I have reviewed the triage vital signs and the nursing notes.  Pertinent labs & imaging results that were available during my care of the patient were reviewed by me and considered in my medical decision making (see chart for details).    MDM Rules/Calculators/A&P                      VASILIA DISE is a 86 y.o. female with a past medical, and spinal stenosis who presents with a fall.  Patient reports Jordan [name] was at the fridge when her walker rolled away from her and Jordan [name] fell to the ground landing on her right pelvis and hip area.  Jordan [name] denies hitting her head and  denies any  headache or neck pain.  Jordan [name] denies any chest pain or back pain.  Pain is all in the right low pelvis and hip area.  Jordan [name] has not been able to walk due to the pain.  Jordan [name] reports the pain is severe but when Jordan [name] is at rest it is not hurting as bad.  Jordan [name] denies numbness or tingling.  Jordan [name] denies loss of bowel or bladder control.  Jordan [name] denies other complaints. Jordan [name] denied back pain initially.   Jordan [name] reports that Jordan [name] does live independently and has someone check on her during the days of the week but Jordan [name] manages herself without assistance on the weekends.  Jordan [name] otherwise does very well and is mobile with a walker at baseline.  On exam, lungs are clear and chest is nontender.  Abdomen is nontender.  No back tenderness.  Patient does have tenderness in her right pelvis and hip with movement.  Jordan [name] had normal sensation and strength in the feet.  Good pulses in her lower extremities.  No laceration seen.  Abdomen otherwise nontender.  Normal bowel sounds. Jordan [name] did had severe pain in  Hip and pelvis when moving so did not roll patient over initially.   Clinically I am very concerned about pelvis or hip fracture.  We will get screening labs and chest x-ray and EKG for preoperative management.  We will get a hip and pelvis x-ray.  X-ray concerning for possible pubic ramus fractures.  Will get CT scan to confirm.  Anticipate touching base with orthopedics for further management.  Covid test will be ordered after imaging to see if rapid test is needed preoperatively.   2:27 PM Radiology called that while patient was in CT scanner, Jordan [name] was complaining of some low back pain. Will get CT lumbar spine as well as the pelvis CT. Anticipate following imaging results.   Care transferred to oncoming team while awaiting CT results. Anticipate admission if injuries are discovered.   Care transferred in stable condition.   Final Clinical Impression(s) / ED Diagnoses Final diagnoses:  Fall  Fall, initial encounter    Clinical  Impression: 1. Fall   2. Fall, initial encounter     Disposition: Care transferred to oncoming team while awaiting CT results. Anticipate admission if injuries are discovered.   This note was prepared with assistance of Conservation officer, historic buildings. Occasional wrong-word or sound-a-like substitutions may have occurred due to the inherent limitations of voice recognition software.     Walida Cajas, Canary Brim, MD 10/23/19 [id number]

## 2019-10-24 DIAGNOSIS — E440 Moderate protein-calorie malnutrition: Secondary | ICD-10-CM | POA: Insufficient documentation

## 2019-10-24 LAB — BASIC METABOLIC PANEL
Anion gap: 9 (ref 5–15)
BUN: 24 mg/dL — ABNORMAL HIGH (ref 8–23)
CO2: 22 mmol/L (ref 22–32)
Calcium: 8.7 mg/dL — ABNORMAL LOW (ref 8.9–10.3)
Chloride: 108 mmol/L (ref 98–111)
Creatinine, Ser: 0.69 mg/dL (ref 0.44–1.00)
GFR calc Af Amer: >60 mL/min (ref >60)
GFR calc non Af Amer: >60 mL/min (ref >60)
Glucose, Bld: 105 mg/dL — ABNORMAL HIGH (ref 70–99)
Potassium: 3.8 mmol/L (ref 3.5–5.1)
Sodium: 139 mmol/L (ref 135–145)

## 2019-10-24 LAB — CBC
HCT: 32.6 % — ABNORMAL LOW (ref 36.0–46.0)
Hemoglobin: 10.3 g/dL — ABNORMAL LOW (ref 12.0–15.0)
MCH: 30.7 pg (ref 26.0–34.0)
MCHC: 31.6 g/dL (ref 30.0–36.0)
MCV: 97.3 fL (ref 80.0–100.0)
Platelets: 237 10*3/uL (ref 150–400)
RBC: 3.35 MIL/uL — ABNORMAL LOW (ref 3.87–5.11)
RDW: 13.1 % (ref 11.5–15.5)
WBC: 9 10*3/uL (ref 4.0–10.5)
nRBC: 0 % (ref 0.0–0.2)

## 2019-10-24 LAB — MAGNESIUM: Magnesium: 2 mg/dL (ref 1.7–2.4)

## 2019-10-24 LAB — SARS CORONAVIRUS 2 (TAT 6-24 HRS): SARS Coronavirus 2: NEGATIVE

## 2019-10-24 LAB — ABO/RH: ABO/RH(D): O POS

## 2019-10-24 MED ORDER — ENSURE ENLIVE PO LIQD
237.0000 mL | ORAL | Status: DC
  Administered 2019-10-25 – 2019-10-26 (×2): 237 mL via ORAL

## 2019-10-24 MED ORDER — ADULT MULTIVITAMIN W/MINERALS CH
1.0000 | ORAL_TABLET | Freq: Every day | ORAL | Status: DC
  Administered 2019-10-24 – 2019-10-26 (×3): 1 via ORAL
  Filled 2019-10-24 (×3): qty 1

## 2019-10-24 NOTE — Evaluation (Addendum)
Physical Therapy Evaluation Patient Details Name: Jordan [name] MRN: [id number] DOB: [birth year] Today's Date: 10/24/2019   History of Present Illness  86 y.o. female with medical history significant of hypertension, hyperlipidemia, paroxysmal A. fib not on anticoagulation probably secondary to age, hearing impairment, coronary disease, aortic insufficiency presented with fall and hip pain. Pt found to have Right superior pubic fx and Right sacral ala fx to be treated conservatively.  Clinical Impression  Pt admitted with above diagnosis.  Pt currently with functional limitations due to the deficits listed below (see PT Problem List). Pt will benefit from skilled PT to increase their independence and safety with mobility to allow discharge to the venue listed below.  Pt requiring at least max assist for bed mobility and unable to tolerate standing with a RW due to pain.  Pt from home alone and would benefit from SNF.  Caregiver and pt's ?grandson in room for evaluation and agree with plan for SNF.  Pt very HOH.     Follow Up Recommendations SNF;Supervision/Assistance - 24 hour    Equipment Recommendations  None recommended by PT    Recommendations for Other Services       Precautions / Restrictions Precautions Precautions: Fall Restrictions Weight Bearing Restrictions: No      Mobility  Bed Mobility Overal bed mobility: Needs Assistance Bed Mobility: Supine to Sit;Sit to Supine     Supine to sit: Max assist Sit to supine: Max assist   General bed mobility comments: pt requiring assist for trunk upright, R LE and utilized bed pad to scoot to EOB; assisted back to bed upper and lower body  Transfers                 General transfer comment: attempted however unable to stand with RW  Ambulation/Gait                Stairs            Wheelchair Mobility    Modified Rankin (Stroke Patients Only)       Balance                                              Pertinent Vitals/Pain Pain Assessment: Faces Faces Pain Scale: Hurts whole lot Pain Location: R hip/groin Pain Descriptors / Indicators: Grimacing;Discomfort;Sharp Pain Intervention(s): Monitored during session;Repositioned    Home Living Family/patient expects to be discharged to:: Skilled nursing facility Living Arrangements: Alone                    Prior Function Level of Independence: Independent with assistive device(s)         Comments: uses RW     Hand Dominance        Extremity/Trunk Assessment        Lower Extremity Assessment Lower Extremity Assessment: Generalized weakness;RLE deficits/detail RLE Deficits / Details: requiring assist for mobility due to pain       Communication   Communication: HOH  Cognition Arousal/Alertness: Awake/alert Behavior During Therapy: WFL for tasks assessed/performed Overall Cognitive Status: Difficult to assess                                        General Comments      Exercises  Assessment/Plan    PT Assessment Patient needs continued PT services  PT Problem List Pain;Decreased balance;Decreased knowledge of use of DME;Decreased activity tolerance;Decreased range of motion;Decreased strength;Decreased mobility       PT Treatment Interventions Gait training;DME instruction;Therapeutic exercise;Therapeutic activities;Functional mobility training;Balance training;Patient/family education    PT Goals (Current goals can be found in the Care Plan section)  Acute Rehab PT Goals PT Goal Formulation: With family Time For Goal Achievement: 11/07/19 Potential to Achieve Goals: Fair    Frequency     Barriers to discharge        Co-evaluation               AM-PAC PT "6 Clicks" Mobility  Outcome Measure Help needed turning from your back to your side while in a flat bed without using bedrails?: A Lot Help needed moving from lying on your back to sitting  on the side of a flat bed without using bedrails?: A Lot Help needed moving to and from a bed to a chair (including a wheelchair)?: A Lot Help needed standing up from a chair using your arms (e.g., wheelchair or bedside chair)?: Total Help needed to walk in hospital room?: Total Help needed climbing 3-5 steps with a railing? : Total 6 Click Score: 9    End of Session   Activity Tolerance: Patient limited by pain Patient left: in bed;with call bell/phone within reach;with bed alarm set;with family/visitor present   PT Visit Diagnosis: Other abnormalities of gait and mobility (R26.89);History of falling (Z91.81)    Time: 6629-4765 PT Time Calculation (min) (ACUTE ONLY): 16 min   Charges:   PT Evaluation $PT Eval Low Complexity: 1 Low         Kati PT, DPT Acute Rehabilitation Services Office: [phone]   Trena Platt 10/24/2019, 4:51 PM

## 2019-10-24 NOTE — TOC Initial Note (Signed)
Transition of Care [facility]) - Initial/Assessment Note    Patient Details  Name: Jordan [name] MRN: [id number] Date of Birth: [birth year]  [facility]) CM/SW Contact:    Lennart Pall, LCSW Phone Number: 10/24/2019, 1:07 PM  Clinical Narrative:    Met with pt and grandson today to discuss current situation and d/c plans. Pt is extremely HOH and hearing aids currently charging.  She is oriented to what is going on but difficult to complete intake with just her.  Grandson able to confirm that she still, at 86 yrs old, was living alone in her home.  She does have a private aide, Thayer Headings, who provides assistance 4-5 days/ week for "several hours" and assists with ADLs, home management and transportation.  She has many supportive friends and church.  Grandson lives in [place] and provides assist as she will "allow".  He is very much in agreement with plan for SNF/ rehab after d/c, however, states pt may be resistant to this.  Grandson feels that the MD will need to insist that she do this for medical/ therapy reasons. Currently awaiting therapy evaluations. Will follow up again with pt once her hearing aids are functioning.               Expected Discharge Plan: Skilled Nursing Facility Barriers to Discharge: Continued Medical Work up   Patient Goals and CMS Choice Patient states their goals for this hospitalization and ongoing recovery are:: Pt's goal is to go home.      Expected Discharge Plan and Services Expected Discharge Plan: [place] In-house Referral: Clinical Social Work     Living arrangements for the past 2 months: Single Family Home                                      Prior Living Arrangements/Services Living arrangements for the past 2 months: Single Family Home Lives with:: Self Patient language and need for interpreter reviewed:: Yes Do you feel safe going back to the place where you live?: Yes      Need for Family Participation in Patient  Care: Yes (Comment) Care giver support system in place?: No (comment)(anticipate need for SNF) Current home services: Homehealth aide Criminal Activity/Legal Involvement Pertinent to Current Situation/Hospitalization: No - Comment as needed  Activities of Daily Living Home Assistive Devices/Equipment: Grab bars in shower, Walker (specify type) ADL Screening (condition at time of admission) Patient's cognitive ability adequate to safely complete daily activities?: Yes Is the patient deaf or have difficulty hearing?: Yes Does the patient have difficulty seeing, even when wearing glasses/contacts?: No Does the patient have difficulty concentrating, remembering, or making decisions?: Yes Patient able to express need for assistance with ADLs?: Yes Does the patient have difficulty dressing or bathing?: No Independently performs ADLs?: Yes (appropriate for developmental age) Does the patient have difficulty walking or climbing stairs?: Yes Weakness of Legs: Both Weakness of Arms/Hands: Both  Permission Sought/Granted Permission sought to share information with : Family Supports, Customer service manager, Case Optician, dispensing granted to share information with : Yes, Verbal Permission Granted  Share Information with NAME: Lester [place]     Permission granted to share info w Relationship: grandson  Permission granted to share info w Contact Information: [phone]  Emotional Assessment Appearance:: Appears younger than stated age Attitude/Demeanor/Rapport: Engaged, Gracious Affect (typically observed): Accepting, Pleasant Orientation: : Oriented to Self, Oriented to Place, Oriented to  Time Alcohol / Substance Use: Not Applicable Psych Involvement: No (comment)  Admission diagnosis:  Hip fracture ([place]) [S72.009A] Fall [W19.XXXA] Fall, initial encounter [id number]] Patient Active Problem List   Diagnosis Date Noted  . Hip fracture ([place]) 10/23/2019  . Acute midline low back  pain without sciatica 06/08/2019  . Pain in left wrist 06/08/2019  . Paroxysmal atrial fibrillation ([place]) 10/04/2018  . Chest pain   . SOB (shortness of breath)   . Coronary artery disease   . Hyperlipidemia   . Hypertension   . Aortic insufficiency    PCP:  Lajean Manes, MD Pharmacy:   [facility], [address] [place] [address] Phone: [phone] Fax: [phone]  [facility] [place] [address] [place] [address] Phone: [phone] Fax: [phone]     Social Determinants of Health (SDOH) Interventions    Readmission Risk Interventions Readmission Risk Prevention Plan 10/24/2019  Transportation Screening Complete  PCP or Specialist Appt within 5-7 Days Complete  Home Care Screening Complete  Medication Review (RN CM) Complete  Some recent data might be hidden

## 2019-10-24 NOTE — Progress Notes (Signed)
PROGRESS NOTE    Jordan [name]  HMC:[id number] DOB: 12-29-1915 DOA: 10/23/2019 PCP: Merlene Laughter, MD     Brief Narrative:  Jordan [name] is a 86 y.o. female with medical history significant of hypertension, hyperlipidemia, paroxysmal A. fib not on anticoagulation probably secondary to age, hearing impairment, coronary disease, aortic insufficiency presented with fall and hip pain.  Patient is very hard of hearing and a poor historian. Patient lives at home alone and apparently fell today.  Since then, she has been having hip pain and inability to ambulate. CT of the pelvis showed comminuted displaced fractures of the right pubic body and medial aspects of the right superior and inferior pubic rami along with vertical fracture of the right sacral ala along with hemorrhage into the soft tissues adjacent to the symphysis pubis without a discrete hematoma.  Orthopedics/Dr. Roda Shutters was consulted who recommended conservative management.  New events last 24 hours / Subjective: Patient very hard of hearing.  She is eating breakfast in bed, in no acute distress, denies any worsening pain.  Assessment & Plan:   Principal Problem:   Hip fracture (HCC) Active Problems:   Coronary artery disease   Hyperlipidemia   Hypertension   Paroxysmal atrial fibrillation (HCC)   Acute pelvic fracture secondary to fall -Patient presented with a fall and CT of the pelvis shows comminuted displaced fractures of the right pubic body and medial aspects of the right superior and inferior pubic rami along with vertical fracture through the right sacral ala with hemorrhage into the soft tissues adjacent to the symphysis pubis without a discrete hematoma -Orthopedics/Dr.Xu has been consulted by the ED provider who recommended conservative management.   Follow-up as an outpatient with repeat imaging in 2 to 4 weeks. -Fall precautions.  Pain management.  PT/OT eval.    Lumbar spinal stenosis at L3-4 -As evidenced on CT of  the lumbar spine.  No evidence of acute fracture -There is old benign compression fracture of T12 -PT OT as ordered  Paroxysmal A. Fib -Has intermittent bradycardia.  Not on anticoagulation probably secondary to advanced age.  Continue amiodarone -Outpatient follow-up with PCP/cardiology  Hypertension -Continue amlodipine  History of coronary artery disease -Stable.  Outpatient follow-up   DVT prophylaxis: Lovenox Code Status: DNR Family Communication: No family at bedside Disposition Plan:  . Patient is from home prior to admission. . Currently in-hospital treatment needed due to PT OT evaluation and possible SNF placement. . Suspect patient will discharge home versus SNF in 1 to 2 days.   Consultants:   Orthopedic surgery  Procedures:   None  Antimicrobials:  Anti-infectives (From admission, onward)   None        Objective: Vitals:   10/23/19 2133 10/24/19 0508 10/24/19 0929 10/24/19 0935  BP: (!) 138/47 (!) 163/50 (!) 151/85 (!) 151/85  Pulse: (!) 58 70  73  Resp: 15 16  17   Temp: 98.7 F (37.1 C) 98.8 F (37.1 C)  98.4 F (36.9 C)  TempSrc:    Oral  SpO2: 95% 93%  94%  Weight: 44.4 kg     Height: 5\' 2"  (1.575 m)       Intake/Output Summary (Last 24 hours) at 10/24/2019 1025 Last data filed at 10/24/2019 10/26/2019 Gross per 24 hour  Intake 60 ml  Output 50 ml  Net 10 ml   Filed Weights   10/23/19 2133  Weight: 44.4 kg    Examination:  General exam: Appears calm and comfortable, hard of hearing Respiratory  system: Clear to auscultation. Respiratory effort normal. No respiratory distress. No conversational dyspnea.  Cardiovascular system: S1 & S2 heard, RRR. No murmurs. No pedal edema. Gastrointestinal system: Abdomen is nondistended, soft and nontender. Normal bowel sounds heard. Central nervous system: Alert  Extremities: Symmetric in appearance  Skin: No rashes, lesions or ulcers on exposed skin  Psychiatry: Judgement and insight appear  normal. Mood & affect appropriate.   Data Reviewed: I have personally reviewed following labs and imaging studies  CBC: Recent Labs  Lab 10/23/19 1314 10/24/19 0425  WBC 13.5* 9.0  NEUTROABS 10.3*  --   HGB 13.7 10.3*  HCT 43.1 32.6*  MCV 98.6 97.3  PLT 307 237   Basic Metabolic Panel: Recent Labs  Lab 10/23/19 1314 10/24/19 0425  NA 141 139  K 4.1 3.8  CL 106 108  CO2 24 22  GLUCOSE 106* 105*  BUN 22 24*  CREATININE 0.89 0.69  CALCIUM 10.0 8.7*  MG  --  2.0   GFR: Estimated Creatinine Clearance: 25.6 mL/min (by C-G formula based on SCr of 0.69 mg/dL). Liver Function Tests: Recent Labs  Lab 10/23/19 1314  AST 30  ALT 25  ALKPHOS 75  BILITOT 0.4  PROT 7.9  ALBUMIN 4.2   No results for input(s): LIPASE, AMYLASE in the last 168 hours. No results for input(s): AMMONIA in the last 168 hours. Coagulation Profile: Recent Labs  Lab 10/23/19 1314  INR 0.9   Cardiac Enzymes: No results for input(s): CKTOTAL, CKMB, CKMBINDEX, TROPONINI in the last 168 hours. BNP (last 3 results) No results for input(s): PROBNP in the last 8760 hours. HbA1C: No results for input(s): HGBA1C in the last 72 hours. CBG: No results for input(s): GLUCAP in the last 168 hours. Lipid Profile: No results for input(s): CHOL, HDL, LDLCALC, TRIG, CHOLHDL, LDLDIRECT in the last 72 hours. Thyroid Function Tests: No results for input(s): TSH, T4TOTAL, FREET4, T3FREE, THYROIDAB in the last 72 hours. Anemia Panel: No results for input(s): VITAMINB12, FOLATE, FERRITIN, TIBC, IRON, RETICCTPCT in the last 72 hours. Sepsis Labs: No results for input(s): PROCALCITON, LATICACIDVEN in the last 168 hours.  Recent Results (from the past 240 hour(s))  SARS CORONAVIRUS 2 (TAT 6-24 HRS) Nasopharyngeal Nasopharyngeal Swab     Status: None   Collection Time: 10/23/19  4:13 PM   Specimen: Nasopharyngeal Swab  Result Value Ref Range Status   SARS Coronavirus 2 NEGATIVE NEGATIVE Final    Comment:  (NOTE) SARS-CoV-2 target nucleic acids are NOT DETECTED. The SARS-CoV-2 RNA is generally detectable in upper and lower respiratory specimens during the acute phase of infection. Negative results do not preclude SARS-CoV-2 infection, do not rule out co-infections with other pathogens, and should not be used as the sole basis for treatment or other patient management decisions. Negative results must be combined with clinical observations, patient history, and epidemiological information. The expected result is Negative. Fact Sheet for Patients: HairSlick.no Fact Sheet for Healthcare Providers: quierodirigir.com This test is not yet approved or cleared by the [place] FDA and  has been authorized for detection and/or diagnosis of SARS-CoV-2 by FDA under an Emergency Use Authorization (EUA). This EUA will remain  in effect (meaning this test can be used) for the duration of the COVID-19 declaration under Section 56 4(b)(1) of the Act, 21 U.S.C. section 360bbb-3(b)(1), unless the authorization is terminated or revoked sooner. Performed at [facility], [address], [place], [place] [address]       Radiology Studies: DG Chest  1 View  Result Date: 10/23/2019 CLINICAL DATA:  Patient with anterior hip pain status post fall. EXAM: CHEST  1 VIEW COMPARISON:  Chest radiograph 05/24/2019. FINDINGS: Monitoring leads overlie the patient. Stable enlarged cardiac and mediastinal contours status post median sternotomy. No large area of pulmonary consolidation. No pleural effusion or pneumothorax. Leftward curvature thoracic spine. Degenerative changes. IMPRESSION: No acute cardiopulmonary process. Electronically Signed   By: Annia Belt M.D.   On: 10/23/2019 14:04   CT LUMBAR SPINE WO CONTRAST  Result Date: 10/23/2019 CLINICAL DATA:  Low back pain and right hip pain secondary to a fall at home. EXAM: CT LUMBAR SPINE WITHOUT CONTRAST  TECHNIQUE: Multidetector CT imaging of the lumbar spine was performed without intravenous contrast administration. Multiplanar CT image reconstructions were also generated. COMPARISON:  Lumbar radiographs dated 01/21/2012 and chest x-ray dated 05/24/2019 and 04/20/2018 FINDINGS: Segmentation: 5 lumbar type vertebrae. Alignment: 2 mm retrolisthesis of L2 on L3. 2 mm anterolisthesis at L3-4 and L4-5. 3 mm protrusion of posterosuperior aspect of the T12 vertebral body into the spinal canal secondary to the old compression fracture. Mild compound thoracolumbar scoliosis. Vertebrae: There is an old moderately severe compression fracture of T12, unchanged since 05/24/2019. Slight protrusion of the posterosuperior aspect of T12 into the spinal canal without significant compression of the thecal sac. The fracture does not involve the posterior elements. There is what is probably an acute fracture of the anterior inferior aspect of the right sacral ala. There is a more subtle fracture of the superior aspect of the right sacral ala. Paraspinal and other soft tissues: Aortic atherosclerosis. Small stones in the left kidney. 2.7 cm cyst on the lower pole of the left kidney. Disc levels: T10-11: No significant abnormality. T11-12: Disc degeneration. No disc bulging or protrusion. Old benign compression fracture of T12 with slight protrusion of bone into the spinal canal without focal neural impingement. T12-L1: Small calcified disc bulge to the left of midline with a bulge in and lateral to the left neural foramen without neural impingement. L1-2: Narrowing of the right side of the L1-2 disc space. No disc bulging or protrusion. L2-3: Disc degeneration. Small broad-based disc bulge with a protrusion into the right lateral recess and right neural foramen without neural impingement. Minimal retrolisthesis of L2 on L3. L3-4: Disc degeneration. 2 mm spondylolisthesis. Small broad-based soft disc protrusion. Hypertrophy of the  ligamentum flavum and facet joints combine to create fairly severe spinal stenosis. There is a protrusion into the left neural foramen which could affect the left L3 nerve, image 68 of series 4. Prominent degenerative changes between the spinous processes of L3 and L4. L4-5: 2 mm spondylolisthesis. Disc space narrowing with a vacuum phenomenon. Small broad-based disc bulge. Hypertrophy of the ligamentum flavum and facet joints combine to create mild spinal stenosis there is moderate impingement upon the left lateral recess which could affect the left L5 nerve. No foraminal stenosis. Prominent degenerative changes between the spinous processes of L4 on L5. L5-S1: Moderate right and slight left facet arthritis. No disc bulging or protrusion. IMPRESSION: 1. Acute fracture 2. Moderately severe spinal stenosis at L3-4. 3. Left lateral recess stenosis at L4-5. This could affect the left L5 nerve. 4. Old benign compression fracture of T12. Aortic Atherosclerosis (ICD10-I70.0). Electronically Signed   By: Francene Boyers M.D.   On: 10/23/2019 15:05   CT PELVIS WO CONTRAST  Result Date: 10/23/2019 CLINICAL DATA:  Right pelvic tenderness after a fall. Right hip pain. EXAM: CT PELVIS WITHOUT CONTRAST TECHNIQUE:  Multidetector CT imaging of the pelvis was performed following the standard protocol without intravenous contrast. COMPARISON:  Pelvic radiograph dated 10/23/2019 FINDINGS: Musculoskeletal: There is a comminuted displaced fracture through the right pubic body and of the medial aspects of the right superior and inferior pubic rami with displacement. There is hemorrhage into the adjacent soft tissues without a discrete hematoma. This extends around the anterior inferior aspect of the bladder. There is also a vertical fracture through the right sacral ala. The proximal femurs are intact. No dislocation. Urinary Tract: No abnormality visualized. Bleeding from the pelvic fractures around the anterior inferior aspect of  the bladder. Bowel:  Unremarkable visualized pelvic bowel loops. Vascular/Lymphatic: Extensive aortic atherosclerosis. No adenopathy. Reproductive:  Hysterectomy. No adnexal masses. Other:  None IMPRESSION: 1. Comminuted displaced fractures of the right pubic body and medial aspects of the right superior and inferior pubic rami. 2. Vertical fracture through the right sacral ala. 3. Hemorrhage into the soft tissues adjacent to the symphysis pubis without a discrete hematoma. Aortic Atherosclerosis (ICD10-I70.0). Electronically Signed   By: Lorriane Shire M.D.   On: 10/23/2019 15:11   DG Hip Unilat W or Wo Pelvis 2-3 Views Right  Result Date: 10/23/2019 CLINICAL DATA:  Fall with right-sided hip pain. EXAM: DG HIP (WITH OR WITHOUT PELVIS) 2-3V RIGHT COMPARISON:  None. FINDINGS: No evidence of right-sided hip fracture or dislocation. There is potentially some mild irregularity involving the right superior pubic ramus and a superior pubic ramus fracture cannot be excluded. No evidence of pelvic diastasis. No bony lesions identified. There is mild osteoarthritis of the right hip joint. IMPRESSION: No evidence of right hip fracture. Possible right superior pubic ramus fracture. Electronically Signed   By: Aletta Edouard M.D.   On: 10/23/2019 14:06      Scheduled Meds: . amiodarone  100 mg Oral Daily  . amLODipine  2.5 mg Oral Daily  . vitamin C  500 mg Oral Daily  . brimonidine  1 drop Right Eye BID  . enoxaparin (LOVENOX) injection  30 mg Subcutaneous Q24H  . latanoprost  1 drop Both Eyes QHS  . loratadine  10 mg Oral Daily  . pantoprazole  40 mg Oral Daily  . senna-docusate  1 tablet Oral BID   Continuous Infusions:   LOS: 1 day      Time spent: 35 minutes   Dessa Phi, DO [facility] 10/24/2019, 10:25 AM   Available via Epic secure chat 7am-7pm After these hours, please refer to coverage provider listed on [email or web address]

## 2019-10-24 NOTE — Progress Notes (Signed)
Initial Nutrition Assessment  DOCUMENTATION CODES:   Non-severe (moderate) malnutrition in context of chronic illness, Underweight  INTERVENTION:  - will order Ensure Enlive once/day, each supplement provides 350 kcal and 20 grams of protein. - will order Magic Cup BID with meals, each supplement provides 290 kcal and 9 grams of protein. - will order daily multivitamin with minerals. - recommend liberalize diet from Heart Healthy to Regular.    NUTRITION DIAGNOSIS:   Moderate Malnutrition related to chronic illness as evidenced by mild fat depletion, moderate muscle depletion  GOAL:   Patient will meet greater than or equal to 90% of their needs  MONITOR:   PO intake, Supplement acceptance, Labs, Weight trends  REASON FOR ASSESSMENT:   Malnutrition Screening Tool  ASSESSMENT:   86 y.o. female with medical history significant of HTN, hyperlipidemia, A. fib not on anticoagulation, hearing impairment, coronary disease, and aortic insufficiency. She presented to the ED s/p fall and hip pain. Patient is very hard of hearing and a poor historian. Patient lives at home alone and apparently fell today.  Since then, she has been having hip pain and inability to ambulate. CT of the pelvis showed comminuted displaced fractures of the right pubic body and medial aspects of the right superior and inferior pubic rami along with vertical fracture of the right sacral ala along with hemorrhage into the soft tissues adjacent to the symphysis pubis without a discrete hematoma. Orthopedics was consulted who recommended conservative management.  No PO intakes documented since admission. Patient had received lunch tray and been re-positioned in bed shortly before RD visit. She is very hard of hearing so a thorough conversation was not possible. She denies chewing or swallowing issues. She had not yet eaten anything off the lunch tray.   Per chart review, weight yesterday 98 lb and weight from  08/17/19-10/11/19 was also 98 lb. Prior to January, the most recently documented weight was on 04/19/19 when she weighted 101 lb. This indicates 3 lb weight loss (3% body weight) from September-January.  Per notes: - R pelvic fx 2/2 fall--plan for conservative management, no plan for surgery - lumbar spinal stenosis with an old benign compression fx to T12 - likely d/c to SNF at the time of Discharge   Labs reviewed; BUN: 24 mg/dl, Ca: 8.7 mg/dl. Medications reviewed; 500 mg ascorbic acid/day, 1 tablet senokot BID.     NUTRITION - FOCUSED PHYSICAL EXAM:    Most Recent Value  Orbital Region  Mild depletion  Upper Arm Region  Moderate depletion  Thoracic and Lumbar Region  Unable to assess  Buccal Region  Mild depletion  Temple Region  Moderate depletion  Clavicle Bone Region  Moderate depletion  Clavicle and Acromion Bone Region  Moderate depletion  Scapular Bone Region  Unable to assess  Dorsal Hand  Moderate depletion  Patellar Region  Unable to assess  Anterior Thigh Region  Unable to assess  Posterior Calf Region  Unable to assess  Edema (RD Assessment)  Unable to assess  Hair  Reviewed  Eyes  Reviewed  Mouth  Reviewed  Skin  Reviewed  Nails  Reviewed       Diet Order:   Diet Order            Diet Heart Room service appropriate? Yes; Fluid consistency: Thin  Diet effective now              EDUCATION NEEDS:   No education needs have been identified at this time  Skin:  Skin Assessment: Reviewed RN Assessment  Last BM:  3/27  Height:   Ht Readings from Last 1 Encounters:  10/23/19 5\' 2"  (1.575 m)    Weight:   Wt Readings from Last 1 Encounters:  10/23/19 44.4 kg    Estimated Nutritional Needs:  Kcal:  1100-1315 kcal Protein:  55-65 grams Fluid:  >/= 1.5 L/day     Jarome Matin, MS, RD, LDN, CNSC Inpatient Clinical Dietitian RD pager # available in [facility]  After hours/weekend pager # available in [facility]

## 2019-10-25 MED ORDER — OXYCODONE HCL 5 MG PO TABS: 5.0000 mg | ORAL_TABLET | ORAL | 0 refills | Status: DC | PRN

## 2019-10-25 NOTE — Evaluation (Signed)
Occupational Therapy Evaluation Patient Details Name: DAVIONA HERBERT MRN: [id number] DOB: 1915/10/21 Today's Date: 10/25/2019    History of Present Illness 86 y.o. female with medical history significant of hypertension, hyperlipidemia, paroxysmal A. fib not on anticoagulation probably secondary to age, hearing impairment, coronary disease, aortic insufficiency presented with fall and hip pain. Pt found to have Right superior pubic fx and Right sacral ala fx to be treated conservatively.   Clinical Impression   This 86 y/o female presents with the above. PTA pt living alone, using RW for functional mobility and has an aide who assists a few hrs a day with daily tasks. Pt very HOH and requiring repetition and gestural cues to follow commands today. She currently requires setup/supervision for self-feeding and simple grooming ADL tasks, currently requiring totalA for LB and toileting ADL given R hip/LE pain and overall weakness. Pt also with c/o nausea this session and not able to tolerate activity beyond bed level. Grandson present and supportive throughout. Pt will benefit from continued acute OT services and currently recommend follow up OT services in SNF setting after discharge to maximize her overall safety and independence with ADL/mobility. Will follow.     Follow Up Recommendations  SNF;Supervision/Assistance - 24 hour    Equipment Recommendations  Other (comment)(TBD)           Precautions / Restrictions Precautions Precautions: Fall Restrictions Weight Bearing Restrictions: No      Mobility Bed Mobility Overal bed mobility: Needs Assistance             General bed mobility comments: maxA to reposition in bed, pt could not attempt sitting EOB due to increased nausea   Transfers                 General transfer comment: not attempted         ADL either performed or assessed with clinical judgement   ADL Overall ADL's : Needs  assistance/impaired Eating/Feeding: Set up;Minimal assistance   Grooming: Wash/dry face;Set up;Supervision/safety;Bed level   Upper Body Bathing: Maximal assistance;Bed level   Lower Body Bathing: Total assistance   Upper Body Dressing : Maximal assistance;Moderate assistance;Bed level   Lower Body Dressing: Total assistance       Toileting- Clothing Manipulation and Hygiene: Total assistance         General ADL Comments: pt limited today due to nausea, tolerated bed level activity and basic ADL completion; additional limitations include weakness, RLE pain, decreased activity tolerance                          Pertinent Vitals/Pain Pain Assessment: Faces Faces Pain Scale: Hurts even more Pain Location: R hip/groin Pain Descriptors / Indicators: Grimacing;Discomfort;Sharp Pain Intervention(s): Limited activity within patient's tolerance;Monitored during session;Repositioned     Hand Dominance     Extremity/Trunk Assessment Upper Extremity Assessment Upper Extremity Assessment: LUE deficits/detail;Generalized weakness LUE Deficits / Details: limited shoulder ROM due to baseline arthritis, shoulder ROM grossly 0-45* (comfortably) LUE Coordination: decreased gross motor   Lower Extremity Assessment Lower Extremity Assessment: Defer to PT evaluation       Communication Communication Communication: HOH   Cognition Arousal/Alertness: Awake/alert Behavior During Therapy: WFL for tasks assessed/performed Overall Cognitive Status: Difficult to assess                                 General Comments: pt very HOH, following most gestural  cues, grandson present and assisting to clarify some questions    General Comments  grandson present and supportive during this session    Exercises Exercises: General Upper Extremity;General Lower Extremity General Exercises - Upper Extremity Shoulder Flexion: AAROM;Right;5 reps;Supine Elbow Flexion:  AAROM;Both;10 reps;Seated Elbow Extension: AAROM;Both;10 reps;Supine General Exercises - Lower Extremity Ankle Circles/Pumps: AROM;AAROM;Both;10 reps Straight Leg Raises: AAROM;Left;10 reps;Right;5 reps   Shoulder Instructions      Home Living Family/patient expects to be discharged to:: Skilled nursing facility Living Arrangements: Alone                                      Prior Functioning/Environment Level of Independence: Independent with assistive device(s)        Comments: uses RW, has an aide/caregiver who comes for few hrs a day to assist with daily tasks         OT Problem List: Decreased strength;Decreased range of motion;Decreased activity tolerance;Impaired balance (sitting and/or standing);Decreased cognition;Decreased safety awareness;Decreased knowledge of use of DME or AE;Decreased knowledge of precautions;Pain      OT Treatment/Interventions: Self-care/ADL training;Therapeutic exercise;Energy conservation;DME and/or AE instruction;Therapeutic activities;Patient/family education;Balance training;Cognitive remediation/compensation    OT Goals(Current goals can be found in the care plan section) Acute Rehab OT Goals Patient Stated Goal: "home" OT Goal Formulation: With patient Time For Goal Achievement: 11/08/19 Potential to Achieve Goals: Good  OT Frequency: Min 2X/week   Barriers to D/C: Decreased caregiver support  pt lives alone       Co-evaluation              AM-PAC OT "6 Clicks" Daily Activity     Outcome Measure Help from another person eating meals?: A Little Help from another person taking care of personal grooming?: A Little Help from another person toileting, which includes using toliet, bedpan, or urinal?: Total Help from another person bathing (including washing, rinsing, drying)?: A Lot Help from another person to put on and taking off regular upper body clothing?: A Lot Help from another person to put on and taking  off regular lower body clothing?: Total 6 Click Score: 12   End of Session Nurse Communication: Mobility status  Activity Tolerance: Patient limited by pain;Other (comment)(limited due to nausea) Patient left: in bed;with call bell/phone within reach;with bed alarm set;with family/visitor present  OT Visit Diagnosis: Muscle weakness (generalized) (M62.81);History of falling (Z91.81);Other abnormalities of gait and mobility (R26.89);Pain Pain - Right/Left: Right Pain - part of body: Hip;Leg                Time: 1740-8144 OT Time Calculation (min): 24 min Charges:  OT General Charges $OT Visit: 1 Visit OT Evaluation $OT Eval Moderate Complexity: 1 Mod OT Treatments $Self Care/Home Management : 8-22 mins  Lou Cal, OT Acute Rehabilitation Services Pager [phone] Office [place] 10/25/2019, 4:22 PM

## 2019-10-25 NOTE — TOC Progression Note (Signed)
Transition of Care [facility]) - Progression Note    Patient Details  Name: Jordan [name] MRN: [id number] Date of Birth: 1915-12-30  Transition [facility]) CM/SW Contact  Amada Jupiter, LCSW Phone Number: 10/25/2019, 3:22 PM  Clinical Narrative:   Pt/ grandson have accepted SNF bed offer from [facility] and Rehab.  Have submitted request for insurance authorization and hope to tf pt to facility tomorrow.  No need for COVID test if d/c tomorrow.    Expected Discharge Plan: Skilled Nursing Facility Barriers to Discharge: Continued Medical Work up  Expected Discharge Plan and Services Expected Discharge Plan: Skilled Nursing Facility In-house Referral: Clinical Social Work     Living arrangements for the past 2 months: Single Family Home                                       Social Determinants of Health (SDOH) Interventions    Readmission Risk Interventions Readmission Risk Prevention Plan 10/24/2019  Transportation Screening Complete  PCP or Specialist Appt within 5-7 Days Complete  Home Care Screening Complete  Medication Review (RN CM) Complete  Some recent data might be hidden

## 2019-10-25 NOTE — TOC Progression Note (Signed)
Transition of Care [facility]) - Progression Note    Patient Details  Name: Jordan [name] MRN: [id number] Date of Birth: Dec 23, 1915  Transition [facility]) CM/SW Contact  Amada Jupiter, LCSW Phone Number: 10/25/2019, 10:56 AM  Clinical Narrative:   Able to speak with pt today with her hearing aids in place.  Explained that MD and PT have recommended SNF for rehab and she is agreeable.  Will send out FL2 and begin bed search.    Expected Discharge Plan: Skilled Nursing Facility Barriers to Discharge: Continued Medical Work up  Expected Discharge Plan and Services Expected Discharge Plan: Skilled Nursing Facility In-house Referral: Clinical Social Work     Living arrangements for the past 2 months: Single Family Home                                       Social Determinants of Health (SDOH) Interventions    Readmission Risk Interventions Readmission Risk Prevention Plan 10/24/2019  Transportation Screening Complete  PCP or Specialist Appt within 5-7 Days Complete  Home Care Screening Complete  Medication Review (RN CM) Complete  Some recent data might be hidden

## 2019-10-25 NOTE — NC FL2 (Signed)
[place] MEDICAID FL2 LEVEL OF CARE SCREENING TOOL     IDENTIFICATION  Patient Name: Jordan [name] Birthdate: May 31, 1916 Sex: female Admission Date (Current Location): 10/23/2019  [facility] and [place] Number:  Producer, television/film/video and Address:  [facility],  [address] [place], [place] [address]      Provider Number: [id number]  Attending Physician Name and Address:  Noralee Stain, DO  Relative Name and Phone Number:  Margrett Rud @ [phone]    Current Level of Care: Hospital Recommended Level of Care: Skilled Nursing Facility Prior Approval Number:    Date Approved/Denied:   PASRR Number: [id number]  Discharge Plan: SNF    Current Diagnoses: Patient Active Problem List   Diagnosis Date Noted  . Malnutrition of moderate degree 10/24/2019  . Hip fracture (HCC) 10/23/2019  . Acute midline low back pain without sciatica 06/08/2019  . Pain in left wrist 06/08/2019  . Paroxysmal atrial fibrillation (HCC) 10/04/2018  . Chest pain   . SOB (shortness of breath)   . Coronary artery disease   . Hyperlipidemia   . Hypertension   . Aortic insufficiency     Orientation RESPIRATION BLADDER Height & Weight     Self, Time, Situation, Place  Normal External catheter Weight: 97 lb 15.9 oz (44.4 kg) Height:  5\' 2"  (157.5 cm)  BEHAVIORAL SYMPTOMS/MOOD NEUROLOGICAL BOWEL NUTRITION STATUS      Continent Diet  AMBULATORY STATUS COMMUNICATION OF NEEDS Skin   Extensive Assist Verbally Normal                       Personal Care Assistance Level of Assistance  Bathing, Dressing Bathing Assistance: Maximum assistance   Dressing Assistance: Limited assistance     Functional Limitations Info  Hearing(very HOH)   Hearing Info: Impaired      SPECIAL CARE FACTORS FREQUENCY  PT (By licensed PT), OT (By licensed OT)     PT Frequency: 5x/wk OT Frequency: 5x/wk            Contractures Contractures Info: Not present    Additional Factors  Info  Code Status, Allergies Code Status Info: DNR Allergies Info: see MAR           Current Medications (10/25/2019):  This is the current hospital active medication list Current Facility-Administered Medications  Medication Dose Route Frequency Provider Last Rate Last Admin  . acetaminophen (TYLENOL) tablet 650 mg  650 mg Oral Q6H PRN 10/27/2019, MD       Or  . acetaminophen (TYLENOL) suppository 650 mg  650 mg Rectal Q6H PRN Glade Lloyd, Kshitiz, MD      . amiodarone (PACERONE) tablet 100 mg  100 mg Oral Daily Hanley Ben, Kshitiz, MD   100 mg at 10/24/19 0931  . amLODipine (NORVASC) tablet 2.5 mg  2.5 mg Oral Daily Alekh, Kshitiz, MD   2.5 mg at 10/24/19 0930  . ascorbic acid (VITAMIN C) tablet 500 mg  500 mg Oral Daily 10/26/19, MD   500 mg at 10/24/19 0929  . bisacodyl (DULCOLAX) suppository 10 mg  10 mg Rectal Daily PRN 10/26/19, MD      . brimonidine (ALPHAGAN) 0.2 % ophthalmic solution 1 drop  1 drop Right Eye BID Glade Lloyd, MD   1 drop at 10/24/19 2032  . enoxaparin (LOVENOX) injection 30 mg  30 mg Subcutaneous Q24H 2033, MD   30 mg at 10/24/19 2032  . feeding supplement (ENSURE ENLIVE) (ENSURE ENLIVE) liquid 237  mL  237 mL Oral Q24H Dessa Phi, DO      . fluticasone (FLONASE) 50 MCG/ACT nasal spray 1-2 spray  1-2 spray Each Nare Daily PRN Alekh, Kshitiz, MD      . latanoprost (XALATAN) 0.005 % ophthalmic solution 1 drop  1 drop Both Eyes QHS Starla Link, Kshitiz, MD   1 drop at 10/24/19 2032  . loratadine (CLARITIN) tablet 10 mg  10 mg Oral Daily Aline August, MD   10 mg at 10/24/19 0929  . morphine 2 MG/ML injection 1 mg  1 mg Intravenous Q4H PRN Aline August, MD   1 mg at 10/23/19 2056  . multivitamin with minerals tablet 1 tablet  1 tablet Oral Daily Dessa Phi, DO   1 tablet at 10/24/19 1419  . nitroGLYCERIN (NITROSTAT) SL tablet 0.4 mg  0.4 mg Sublingual Q5 Min x 3 PRN Alekh, Kshitiz, MD      . oxyCODONE (Oxy IR/ROXICODONE) immediate release tablet 5  mg  5 mg Oral Q4H PRN Aline August, MD   5 mg at 10/25/19 0520  . pantoprazole (PROTONIX) EC tablet 40 mg  40 mg Oral Daily Aline August, MD   40 mg at 10/24/19 0929  . polyethylene glycol (MIRALAX / GLYCOLAX) packet 17 g  17 g Oral Daily PRN Aline August, MD      . senna-docusate (Senokot-S) tablet 1 tablet  1 tablet Oral BID Aline August, MD   1 tablet at 10/24/19 2032     Discharge Medications: Please see discharge summary for a list of discharge medications.  Relevant Imaging Results:  Relevant Lab Results:   Additional Information SS# [id number]  Lennart Pall, LCSW

## 2019-10-25 NOTE — Progress Notes (Signed)
PROGRESS NOTE    Jordan [name]  JXB:[id number] DOB: [birth year] DOA: 10/23/2019 PCP: Merlene Laughter, MD     Brief Narrative:  Jordan [name] is a 86 y.o. female with medical history significant of hypertension, hyperlipidemia, paroxysmal A. fib not on anticoagulation probably secondary to age, hearing impairment, coronary disease, aortic insufficiency presented with fall and hip pain.  Patient is very hard of hearing and a poor historian. Patient lives at home alone and apparently fell today.  Since then, she has been having hip pain and inability to ambulate. CT of the pelvis showed comminuted displaced fractures of the right pubic body and medial aspects of the right superior and inferior pubic rami along with vertical fracture of the right sacral ala along with hemorrhage into the soft tissues adjacent to the symphysis pubis without a discrete hematoma.  Orthopedics/Dr. Roda Shutters was consulted who recommended conservative management.  New events last 24 hours / Subjective: Sleeping comfortably but arousable, states she is feeling well, no complaints of worsening pain this morning. Hard of hearing.   Assessment & Plan:   Principal Problem:   Hip fracture (HCC) Active Problems:   Coronary artery disease   Hyperlipidemia   Hypertension   Paroxysmal atrial fibrillation (HCC)   Malnutrition of moderate degree   Acute pelvic fracture secondary to fall -Patient presented with a fall and CT of the pelvis shows comminuted displaced fractures of the right pubic body and medial aspects of the right superior and inferior pubic rami along with vertical fracture through the right sacral ala with hemorrhage into the soft tissues adjacent to the symphysis pubis without a discrete hematoma -Orthopedics/Dr.Xu has been consulted by the ED provider who recommended conservative management.   Follow-up as an outpatient with repeat imaging in 2 to 4 weeks. -Fall precautions.  Pain management.  PT/OT eval  recommending SNF.   Lumbar spinal stenosis at L3-4 -As evidenced on CT of the lumbar spine.  No evidence of acute fracture -There is old benign compression fracture of T12 -PT/OT eval recommending SNF.   Paroxysmal A. Fib -Has intermittent bradycardia.  Not on anticoagulation probably secondary to advanced age.  Continue amiodarone -Outpatient follow-up with PCP/cardiology  Hypertension -Continue amlodipine  History of coronary artery disease -Stable.  Outpatient follow-up   DVT prophylaxis: Lovenox Code Status: DNR Family Communication: No family at bedside Disposition Plan:  . Patient is from home prior to admission. . Medically ready for SNF discharge once placement found.   Consultants:   Orthopedic surgery  Procedures:   None  Antimicrobials:  Anti-infectives (From admission, onward)   None       Objective: Vitals:   10/24/19 1422 10/24/19 1423 10/24/19 2144 10/25/19 0605  BP: (!) 171/73  (!) 168/41 (!) 142/63  Pulse: 74 67 76 77  Resp: 18  18 14   Temp: 97.9 F (36.6 C)  98.4 F (36.9 C) 98.5 F (36.9 C)  TempSrc: Oral  Oral Oral  SpO2: 92% 94% 94% 93%  Weight:      Height:        Intake/Output Summary (Last 24 hours) at 10/25/2019 1006 Last data filed at 10/25/2019 0910 Gross per 24 hour  Intake 360 ml  Output 550 ml  Net -190 ml   Filed Weights   10/23/19 2133  Weight: 44.4 kg    Examination: General exam: Appears calm and comfortable, HOH Respiratory system: Clear to auscultation. Respiratory effort normal. Cardiovascular system: S1 & S2 heard, RRR. No pedal edema. Gastrointestinal system: Abdomen  is nondistended, soft and nontender. Normal bowel sounds heard. Central nervous system: Alert. Non focal exam.  Extremities: Symmetric in appearance bilaterally  Skin: No rashes, lesions or ulcers on exposed skin  Psychiatry: Judgement and insight appear stable. Mood & affect appropriate.    Data Reviewed: I have personally reviewed  following labs and imaging studies  CBC: Recent Labs  Lab 10/23/19 1314 10/24/19 0425  WBC 13.5* 9.0  NEUTROABS 10.3*  --   HGB 13.7 10.3*  HCT 43.1 32.6*  MCV 98.6 97.3  PLT 307 237   Basic Metabolic Panel: Recent Labs  Lab 10/23/19 1314 10/24/19 0425  NA 141 139  K 4.1 3.8  CL 106 108  CO2 24 22  GLUCOSE 106* 105*  BUN 22 24*  CREATININE 0.89 0.69  CALCIUM 10.0 8.7*  MG  --  2.0   GFR: Estimated Creatinine Clearance: 25.6 mL/min (by C-G formula based on SCr of 0.69 mg/dL). Liver Function Tests: Recent Labs  Lab 10/23/19 1314  AST 30  ALT 25  ALKPHOS 75  BILITOT 0.4  PROT 7.9  ALBUMIN 4.2   No results for input(s): LIPASE, AMYLASE in the last 168 hours. No results for input(s): AMMONIA in the last 168 hours. Coagulation Profile: Recent Labs  Lab 10/23/19 1314  INR 0.9   Cardiac Enzymes: No results for input(s): CKTOTAL, CKMB, CKMBINDEX, TROPONINI in the last 168 hours. BNP (last 3 results) No results for input(s): PROBNP in the last 8760 hours. HbA1C: No results for input(s): HGBA1C in the last 72 hours. CBG: No results for input(s): GLUCAP in the last 168 hours. Lipid Profile: No results for input(s): CHOL, HDL, LDLCALC, TRIG, CHOLHDL, LDLDIRECT in the last 72 hours. Thyroid Function Tests: No results for input(s): TSH, T4TOTAL, FREET4, T3FREE, THYROIDAB in the last 72 hours. Anemia Panel: No results for input(s): VITAMINB12, FOLATE, FERRITIN, TIBC, IRON, RETICCTPCT in the last 72 hours. Sepsis Labs: No results for input(s): PROCALCITON, LATICACIDVEN in the last 168 hours.  Recent Results (from the past 240 hour(s))  SARS CORONAVIRUS 2 (TAT 6-24 HRS) Nasopharyngeal Nasopharyngeal Swab     Status: None   Collection Time: 10/23/19  4:13 PM   Specimen: Nasopharyngeal Swab  Result Value Ref Range Status   SARS Coronavirus 2 NEGATIVE NEGATIVE Final    Comment: (NOTE) SARS-CoV-2 target nucleic acids are NOT DETECTED. The SARS-CoV-2 RNA is  generally detectable in upper and lower respiratory specimens during the acute phase of infection. Negative results do not preclude SARS-CoV-2 infection, do not rule out co-infections with other pathogens, and should not be used as the sole basis for treatment or other patient management decisions. Negative results must be combined with clinical observations, patient history, and epidemiological information. The expected result is Negative. Fact Sheet for Patients: HairSlick.no Fact Sheet for Healthcare Providers: quierodirigir.com This test is not yet approved or cleared by the [place] FDA and  has been authorized for detection and/or diagnosis of SARS-CoV-2 by FDA under an Emergency Use Authorization (EUA). This EUA will remain  in effect (meaning this test can be used) for the duration of the COVID-19 declaration under Section 56 4(b)(1) of the Act, 21 U.S.C. section 360bbb-3(b)(1), unless the authorization is terminated or revoked sooner. Performed at [facility], [address], [place], [place] [address]       Radiology Studies: DG Chest 1 View  Result Date: 10/23/2019 CLINICAL DATA:  Patient with anterior hip pain status post fall. EXAM: CHEST  1 VIEW COMPARISON:  Chest radiograph 05/24/2019. FINDINGS: Monitoring leads overlie the patient. Stable enlarged cardiac and mediastinal contours status post median sternotomy. No large area of pulmonary consolidation. No pleural effusion or pneumothorax. Leftward curvature thoracic spine. Degenerative changes. IMPRESSION: No acute cardiopulmonary process. Electronically Signed   By: Annia Belt M.D.   On: 10/23/2019 14:04   CT LUMBAR SPINE WO CONTRAST  Result Date: 10/23/2019 CLINICAL DATA:  Low back pain and right hip pain secondary to a fall at home. EXAM: CT LUMBAR SPINE WITHOUT CONTRAST TECHNIQUE: Multidetector CT imaging of the lumbar spine was performed without  intravenous contrast administration. Multiplanar CT image reconstructions were also generated. COMPARISON:  Lumbar radiographs dated 01/21/2012 and chest x-ray dated 05/24/2019 and 04/20/2018 FINDINGS: Segmentation: 5 lumbar type vertebrae. Alignment: 2 mm retrolisthesis of L2 on L3. 2 mm anterolisthesis at L3-4 and L4-5. 3 mm protrusion of posterosuperior aspect of the T12 vertebral body into the spinal canal secondary to the old compression fracture. Mild compound thoracolumbar scoliosis. Vertebrae: There is an old moderately severe compression fracture of T12, unchanged since 05/24/2019. Slight protrusion of the posterosuperior aspect of T12 into the spinal canal without significant compression of the thecal sac. The fracture does not involve the posterior elements. There is what is probably an acute fracture of the anterior inferior aspect of the right sacral ala. There is a more subtle fracture of the superior aspect of the right sacral ala. Paraspinal and other soft tissues: Aortic atherosclerosis. Small stones in the left kidney. 2.7 cm cyst on the lower pole of the left kidney. Disc levels: T10-11: No significant abnormality. T11-12: Disc degeneration. No disc bulging or protrusion. Old benign compression fracture of T12 with slight protrusion of bone into the spinal canal without focal neural impingement. T12-L1: Small calcified disc bulge to the left of midline with a bulge in and lateral to the left neural foramen without neural impingement. L1-2: Narrowing of the right side of the L1-2 disc space. No disc bulging or protrusion. L2-3: Disc degeneration. Small broad-based disc bulge with a protrusion into the right lateral recess and right neural foramen without neural impingement. Minimal retrolisthesis of L2 on L3. L3-4: Disc degeneration. 2 mm spondylolisthesis. Small broad-based soft disc protrusion. Hypertrophy of the ligamentum flavum and facet joints combine to create fairly severe spinal stenosis.  There is a protrusion into the left neural foramen which could affect the left L3 nerve, image 68 of series 4. Prominent degenerative changes between the spinous processes of L3 and L4. L4-5: 2 mm spondylolisthesis. Disc space narrowing with a vacuum phenomenon. Small broad-based disc bulge. Hypertrophy of the ligamentum flavum and facet joints combine to create mild spinal stenosis there is moderate impingement upon the left lateral recess which could affect the left L5 nerve. No foraminal stenosis. Prominent degenerative changes between the spinous processes of L4 on L5. L5-S1: Moderate right and slight left facet arthritis. No disc bulging or protrusion. IMPRESSION: 1. Acute fracture 2. Moderately severe spinal stenosis at L3-4. 3. Left lateral recess stenosis at L4-5. This could affect the left L5 nerve. 4. Old benign compression fracture of T12. Aortic Atherosclerosis (ICD10-I70.0). Electronically Signed   By: Francene Boyers M.D.   On: 10/23/2019 15:05   CT PELVIS WO CONTRAST  Result Date: 10/23/2019 CLINICAL DATA:  Right pelvic tenderness after a fall. Right hip pain. EXAM: CT PELVIS WITHOUT CONTRAST TECHNIQUE: Multidetector CT imaging of the pelvis was performed following the standard protocol without intravenous contrast. COMPARISON:  Pelvic radiograph dated 10/23/2019 FINDINGS: Musculoskeletal: There  is a comminuted displaced fracture through the right pubic body and of the medial aspects of the right superior and inferior pubic rami with displacement. There is hemorrhage into the adjacent soft tissues without a discrete hematoma. This extends around the anterior inferior aspect of the bladder. There is also a vertical fracture through the right sacral ala. The proximal femurs are intact. No dislocation. Urinary Tract: No abnormality visualized. Bleeding from the pelvic fractures around the anterior inferior aspect of the bladder. Bowel:  Unremarkable visualized pelvic bowel loops. Vascular/Lymphatic:  Extensive aortic atherosclerosis. No adenopathy. Reproductive:  Hysterectomy. No adnexal masses. Other:  None IMPRESSION: 1. Comminuted displaced fractures of the right pubic body and medial aspects of the right superior and inferior pubic rami. 2. Vertical fracture through the right sacral ala. 3. Hemorrhage into the soft tissues adjacent to the symphysis pubis without a discrete hematoma. Aortic Atherosclerosis (ICD10-I70.0). Electronically Signed   By: Lorriane Shire M.D.   On: 10/23/2019 15:11   DG Hip Unilat W or Wo Pelvis 2-3 Views Right  Result Date: 10/23/2019 CLINICAL DATA:  Fall with right-sided hip pain. EXAM: DG HIP (WITH OR WITHOUT PELVIS) 2-3V RIGHT COMPARISON:  None. FINDINGS: No evidence of right-sided hip fracture or dislocation. There is potentially some mild irregularity involving the right superior pubic ramus and a superior pubic ramus fracture cannot be excluded. No evidence of pelvic diastasis. No bony lesions identified. There is mild osteoarthritis of the right hip joint. IMPRESSION: No evidence of right hip fracture. Possible right superior pubic ramus fracture. Electronically Signed   By: Aletta Edouard M.D.   On: 10/23/2019 14:06      Scheduled Meds: . amiodarone  100 mg Oral Daily  . amLODipine  2.5 mg Oral Daily  . vitamin C  500 mg Oral Daily  . brimonidine  1 drop Right Eye BID  . enoxaparin (LOVENOX) injection  30 mg Subcutaneous Q24H  . feeding supplement (ENSURE ENLIVE)  237 mL Oral Q24H  . latanoprost  1 drop Both Eyes QHS  . loratadine  10 mg Oral Daily  . multivitamin with minerals  1 tablet Oral Daily  . pantoprazole  40 mg Oral Daily  . senna-docusate  1 tablet Oral BID   Continuous Infusions:   LOS: 2 days      Time spent: 20 minutes   Dessa Phi, DO [facility] 10/25/2019, 10:06 AM   Available via Epic secure chat 7am-7pm After these hours, please refer to coverage provider listed on [email or web address]

## 2019-10-25 NOTE — Progress Notes (Signed)
Met with pt and grandson to inform of 3 facility bed offers.  Information provided on these facilities and grandson to discuss with other family and let me know decision today. Should plan for transfer to SNF tomorrow pending insurance authorization. If we can d/c tomorrow, the pt's COVID results are still active and can avoid another test.  Cristofer Yaffe, LCSW

## 2019-10-25 NOTE — Progress Notes (Signed)
OT Cancellation Note  Patient Details Name: Jordan [name] MRN: [id number] DOB: March 25, 1916   Cancelled Treatment:    Reason Eval/Treat Not Completed: Other (comment); pt eating breakfast, will follow up for OT eval as able.   Marcy Siren, OT Acute Rehabilitation Services Pager [phone] Office [phone]   Orlando Penner 10/25/2019, 11:05 AM

## 2019-10-26 ENCOUNTER — Non-Acute Institutional Stay (SKILLED_NURSING_FACILITY): Payer: Medicare Other | Admitting: Internal Medicine

## 2019-10-26 ENCOUNTER — Encounter: Payer: Self-pay | Admitting: Internal Medicine

## 2019-10-26 DIAGNOSIS — I10 Essential (primary) hypertension: Secondary | ICD-10-CM | POA: Diagnosis not present

## 2019-10-26 DIAGNOSIS — I2510 Atherosclerotic heart disease of native coronary artery without angina pectoris: Secondary | ICD-10-CM

## 2019-10-26 DIAGNOSIS — S32509D Unspecified fracture of unspecified pubis, subsequent encounter for fracture with routine healing: Secondary | ICD-10-CM | POA: Diagnosis not present

## 2019-10-26 DIAGNOSIS — I480 Paroxysmal atrial fibrillation: Secondary | ICD-10-CM | POA: Diagnosis not present

## 2019-10-26 NOTE — Progress Notes (Signed)
`  [facility] Room Number: 114//P Place of Service:  SNF (31) Provider: Willia CrazeLassen,Tawania Daponte, PA-C  Stoneking, Hal, MD  Patient Care Team: Merlene LaughterStoneking, Hal, MD as PCP - General (Internal Medicine)  Extended Emergency Contact Information Primary Emergency Contact: Byrd,Betty Address: [address]          [place], [place] [address] [place] Home Phone: [phone] Mobile Phone: [phone] Relation: Friend Secondary Emergency Contact: Judeth HornWillett,Pat          [place], [place] Home Phone: [phone] Relation: Friend  Code Status:  Full Code Goals of care: Advanced Directive information Advanced Directives 10/26/2019  Does Patient Have a Medical Advance Directive? Yes  Type of Advance Directive (No Data)  Does patient want to make changes to medical advance directive? No - Patient declined  Would patient like information on creating a medical advance directive? -     Chief Complaint  Patient presents with   Hospitalization Follow-up    Hospitalization Follow Up  Status post hospitalization for pelvic fractures  HPI:  Pt is a [age] y.o. female seen today for a hospital f/u s/p admission for pelvic fracture sustained after a fall.  Patient has a history of hypertension hyperlipidemia atrial fibrillation not on anticoagulation probably because of her age.  She also has coronary artery disease aortic insufficiency and hearing impairment.  She presented to the ER with hip pain and apparently fell previously.  CT of the pelvis showed a comminuted displaced fractures of the right pubic body and medial aspects of the right superior and inferior pubic rami along with vertical fracture of the right sacral ala along with hemorrhage into the soft tissues adjacent to the symphysis pubis without a discrete hematoma Orthopedics was consulted and recommended conservative management.  Currently at this point pain appears to  be controlled she does have orders for Voltaren gel as well as Tylenol 500 mg twice daily and OxyIR 5 mg every 4 hours as needed.  Regards to atrial fibrillation again she is not on anticoagulation thought secondary to her age this appears to be rate controlled.  She is on amiodarone 100 mg a day  In regards to hypertension she is on Norvasc blood pressure appears to be stable it is 136/68 today.  .  Currently she is lying in bed comfortably she is very pleasant alert does not really have any complaints-apparently there is pain when she has movement  Past Medical History:  Diagnosis Date   Acute midline low back pain without sciatica 06/08/2019   Aortic insufficiency    MILD TO MODERATE   Chest pain    Coronary artery disease    Dizziness    Hearing loss    Hyperlipidemia    Hypertension    Pain in left wrist 06/08/2019   Palpitations    Paroxysmal atrial fibrillation (HCC) 10/04/2018   PVC's (premature ventricular contractions)    SOB (shortness of breath)    Spinal stenosis    Past Surgical History:  Procedure Laterality Date   ABDOMINAL HYSTERECTOMY     APPENDECTOMY     CARDIAC CATHETERIZATION  01/12/1996   THE EF IS DIFFICULT TO ESTIMATE DUE TO PVCS. SEVERE TWO-VESSEL OBSTRUCTIVE ATHERSCLEROTIC CAD WITH COMPLEX STENOSIS IN THE LAD AND DIAGONAL VESSELS   CARDIAC CATHETERIZATION  07/29/1991   EF 80-85%   CORONARY ARTERY BYPASS GRAFT  1997   INGUINAL HERNIA REPAIR     OVARIAN CYST REMOVAL  TONSILLECTOMY AND ADENOIDECTOMY      Allergies  Allergen Reactions   Cafergot     unk   Cephalexin     Unknown    Clinoril [Sulindac]     Unknown    Codeine     Unknown    Dolobid [Diflunisal]     Unknown    Equagesic     Unknown    Flu Virus Vaccine     Unknown    Hyzaar [Losartan Potassium-Hctz]     Unknown    Neosporin [Neomycin-Polymyxin-Gramicidin]     Unknown    Other     BEE STING   Pce [Erythromycin Base]     Unknown     Prednisone     Unknown    Tetracyclines & Related     Unknown     Allergies as of 10/26/2019      Reactions   Cafergot    unk   Cephalexin    Unknown    Clinoril [sulindac]    Unknown    Codeine    Unknown    Dolobid [diflunisal]    Unknown    Equagesic    Unknown    Flu Virus Vaccine    Unknown    Hyzaar [losartan Potassium-hctz]    Unknown    Neosporin [neomycin-polymyxin-gramicidin]    Unknown    Other    BEE STING   Pce [erythromycin Base]    Unknown    Prednisone    Unknown    Tetracyclines & Related    Unknown       Medication List       Accurate as of October 26, 2019  3:27 PM. If you have any questions, ask your nurse or doctor.        STOP taking these medications   ARTHRO-COMPLEX PO     TAKE these medications   acetaminophen 500 MG tablet Commonly known as: TYLENOL Take 500 mg by mouth 2 (two) times daily.   amLODipine 5 MG tablet Commonly known as: NORVASC Take 0.5 tablets (2.5 mg total) by mouth daily.   bisacodyl 10 MG suppository Commonly known as: DULCOLAX If not relieved by Milk of Magnesia, give one Bisacodyl suppository (10mg ) per rectum X 1 dose in 24 hours PRN. (Do not use constipation standing orders for resident with renal failure [GFR less than 30] Contact MD for orders).   brimonidine 0.2 % ophthalmic solution Commonly known as: ALPHAGAN Place 1 drop into the right eye in the morning and at bedtime.   Caltrate 600+D Plus 600-400 MG-UNIT per tablet Take 1 tablet by mouth daily.   diclofenac Sodium 1 % Gel Commonly known as: VOLTAREN Apply 2 g topically daily as needed for pain.   fexofenadine 180 MG tablet Commonly known as: ALLEGRA Take 180 mg by mouth daily.   Fish Oil 1000 MG Caps Take 1,000 mg by mouth daily. Omegaadvance/1000 mg Omega 3S Triglyceride form   fluticasone 50 MCG/ACT nasal spray Commonly known as: FLONASE Place 2 sprays into both nostrils daily as needed for allergies.   Glucosamine-Chondroitin  Caps Take 1 capsule by mouth 2 (two) times daily.   Lumigan 0.01 % Soln Generic drug: bimatoprost Place 1 drop into both eyes at bedtime.   magnesium hydroxide 400 MG/5ML suspension Commonly known as: MILK OF MAGNESIA If no BM in 3 days, give Milk of Magnesia 30cc p.o. X 1 dose in 24 hours PRN (Do not use constipation standing orders for resident with renal failure [GFR  less than 30] Contact MD for orders).   multivitamin with minerals Tabs tablet Take 1 tablet by mouth daily.   nitroGLYCERIN 0.4 MG SL tablet Commonly known as: NITROSTAT Place 0.4 mg under the tongue every 5 (five) minutes as needed for chest pain.   NON FORMULARY DIET: REGULAR, NAS, HEART HEALTHY   omeprazole 20 MG capsule Commonly known as: PRILOSEC Take 20 mg by mouth daily.   oxyCODONE 5 MG immediate release tablet Commonly known as: Oxy IR/ROXICODONE Take 1 tablet (5 mg total) by mouth every 4 (four) hours as needed for moderate pain.   Pacerone 100 MG tablet Generic drug: amiodarone Take 1 tablet by mouth once daily   PreserVision AREDS Caps Take 1 capsule by mouth 2 (two) times daily. What changed: Another medication with the same name was removed. Continue taking this medication, and follow the directions you see here.   RA SALINE ENEMA RE Place rectally. If not relieved by suppository, give Saline Laxative Enema (disposable) X 1 per rectum in 24 hours PRN (Do not use constipation standing orders for resident with renal failure [GFR less than 30] Contact MD for orders).   vitamin C 500 MG tablet Commonly known as: ASCORBIC ACID Take 500 mg by mouth daily.       Review of Systems    In general she is not complaining of any fever or chills.  Skin does not complain of rashes or itching.  Head ears eyes nose mouth and throat is hard of hearing does not complain of visual changes or sore throat.  Respiratory does not complain of shortness of breath or cough.  Cardiac does not complain of  chest pain or edema.  GI is not complaining of abdominal discomfort or having constipation or nausea or vomiting or diarrhea.  GU no complaints of dysuria.  Musculoskeletal does have a history of pelvic fractures as noted above says she has pain with movement but currently says she is not really having significant discomfort lying in bed.  Neurologic does not complain of dizziness headache numbness or syncope.  And psych appears to be in good spirits does not complain of being depressed or anxious Immunization History  Administered Date(s) Administered   Influenza, High Dose Seasonal PF 05/03/2019   Pneumococcal Polysaccharide-23 05/03/2019   Tdap 01/21/2012   Pertinent  Health Maintenance Due  Topic Date Due   PNA vac Low Risk Adult (2 of 2 - PCV13) 05/02/2020   INFLUENZA VACCINE  Completed   DEXA SCAN  Completed   No flowsheet data found. Functional Status Survey:    Vitals:   10/26/19 1506  BP: 136/68  Pulse: 68  Resp: 18  Temp: 98.2 F (36.8 C)  TempSrc: Oral  SpO2: 94%  Weight: 97 lb 15.8 oz (44.4 kg)  Height: 5\' 2"  (1.575 m)   Body mass index is 17.92 kg/m. Physical Exam In general this is a very pleasant elderly female who looks younger than her stated age she appears to be comfortable at this point.  Her skin is warm and dry.  Eyes visual acuity appears to be intact sclera and conjunctive are clear.-She has prescription lenses  Oropharynx clear mucous membranes moist.  Chest is clear to auscultation there is no labored breathing.  Heart is regular rate and rhythm with a systolic murmur she does not have significant lower extremity edema.  Her abdomen is soft nontender with positive bowel sounds.  Musculoskeletal Limited exam since she is in bed she is able to move  all extremities x4 does have some pelvic discomfort when she moves her lower extremities.  I do not note any deformities in the hip pelvic area  Neurologic appears grossly intact  cannot appreciate lateralizing findings her speech is clear.  Psych she appears largely alert and oriented this is somewhat limited secondary to her hearing difficulties she is bright and alert  Labs reviewed: Recent Labs    10/11/19 1204 10/23/19 1314 10/24/19 0425  NA 138 141 139  K 4.4 4.1 3.8  CL 102 106 108  CO2 GLUCOSE 91 106* 105*  BUN 22 22 24*  CREATININE 0.87 0.89 0.69  CALCIUM 9.9 10.0 8.7*  MG  --   --  2.0   Recent Labs    08/17/19 1432 10/11/19 1204 10/23/19 1314  AST ALT ALKPHOS 119* 88 75  BILITOT 0.3 0.3 0.4  PROT 7.4 7.6 7.9  ALBUMIN 4.6 4.5 4.2   Recent Labs    10/11/19 1204 10/23/19 1314 10/24/19 0425  WBC 8.7 13.5* 9.0  NEUTROABS 4.9 10.3*  --   HGB 13.5 13.7 10.3*  HCT 41.0 43.1 32.6*  MCV 96 98.6 97.3  PLT 366 307 237   Lab Results  Component Value Date   TSH 4.320 10/11/2019   No results found for: HGBA1C No results found for: CHOL, HDL, LDLCALC, LDLDIRECT, TRIG, CHOLHDL  Significant Diagnostic Results in last 30 days:  DG Chest 1 View  Result Date: 10/23/2019 CLINICAL DATA:  Patient with anterior hip pain status post fall. EXAM: CHEST  1 VIEW COMPARISON:  Chest radiograph 05/24/2019. FINDINGS: Monitoring leads overlie the patient. Stable enlarged cardiac and mediastinal contours status post median sternotomy. No large area of pulmonary consolidation. No pleural effusion or pneumothorax. Leftward curvature thoracic spine. Degenerative changes. IMPRESSION: No acute cardiopulmonary process. Electronically Signed   By: Annia Belt M.D.   On: 10/23/2019 14:04   CT LUMBAR SPINE WO CONTRAST  Result Date: 10/23/2019 CLINICAL DATA:  Low back pain and right hip pain secondary to a fall at home. EXAM: CT LUMBAR SPINE WITHOUT CONTRAST TECHNIQUE: Multidetector CT imaging of the lumbar spine was performed without intravenous contrast administration. Multiplanar CT image reconstructions were also generated. COMPARISON:   Lumbar radiographs dated 01/21/2012 and chest x-ray dated 05/24/2019 and 04/20/2018 FINDINGS: Segmentation: 5 lumbar type vertebrae. Alignment: 2 mm retrolisthesis of L2 on L3. 2 mm anterolisthesis at L3-4 and L4-5. 3 mm protrusion of posterosuperior aspect of the T12 vertebral body into the spinal canal secondary to the old compression fracture. Mild compound thoracolumbar scoliosis. Vertebrae: There is an old moderately severe compression fracture of T12, unchanged since 05/24/2019. Slight protrusion of the posterosuperior aspect of T12 into the spinal canal without significant compression of the thecal sac. The fracture does not involve the posterior elements. There is what is probably an acute fracture of the anterior inferior aspect of the right sacral ala. There is a more subtle fracture of the superior aspect of the right sacral ala. Paraspinal and other soft tissues: Aortic atherosclerosis. Small stones in the left kidney. 2.7 cm cyst on the lower pole of the left kidney. Disc levels: T10-11: No significant abnormality. T11-12: Disc degeneration. No disc bulging or protrusion. Old benign compression fracture of T12 with slight protrusion of bone into the spinal canal without focal neural impingement. T12-L1: Small calcified disc bulge to the left of midline with a bulge in and lateral to the left neural foramen without  neural impingement. L1-2: Narrowing of the right side of the L1-2 disc space. No disc bulging or protrusion. L2-3: Disc degeneration. Small broad-based disc bulge with a protrusion into the right lateral recess and right neural foramen without neural impingement. Minimal retrolisthesis of L2 on L3. L3-4: Disc degeneration. 2 mm spondylolisthesis. Small broad-based soft disc protrusion. Hypertrophy of the ligamentum flavum and facet joints combine to create fairly severe spinal stenosis. There is a protrusion into the left neural foramen which could affect the left L3 nerve, image 68 of series  4. Prominent degenerative changes between the spinous processes of L3 and L4. L4-5: 2 mm spondylolisthesis. Disc space narrowing with a vacuum phenomenon. Small broad-based disc bulge. Hypertrophy of the ligamentum flavum and facet joints combine to create mild spinal stenosis there is moderate impingement upon the left lateral recess which could affect the left L5 nerve. No foraminal stenosis. Prominent degenerative changes between the spinous processes of L4 on L5. L5-S1: Moderate right and slight left facet arthritis. No disc bulging or protrusion. IMPRESSION: 1. Acute fracture 2. Moderately severe spinal stenosis at L3-4. 3. Left lateral recess stenosis at L4-5. This could affect the left L5 nerve. 4. Old benign compression fracture of T12. Aortic Atherosclerosis (ICD10-I70.0). Electronically Signed   By: Francene Boyers M.D.   On: 10/23/2019 15:05   CT PELVIS WO CONTRAST  Result Date: 10/23/2019 CLINICAL DATA:  Right pelvic tenderness after a fall. Right hip pain. EXAM: CT PELVIS WITHOUT CONTRAST TECHNIQUE: Multidetector CT imaging of the pelvis was performed following the standard protocol without intravenous contrast. COMPARISON:  Pelvic radiograph dated 10/23/2019 FINDINGS: Musculoskeletal: There is a comminuted displaced fracture through the right pubic body and of the medial aspects of the right superior and inferior pubic rami with displacement. There is hemorrhage into the adjacent soft tissues without a discrete hematoma. This extends around the anterior inferior aspect of the bladder. There is also a vertical fracture through the right sacral ala. The proximal femurs are intact. No dislocation. Urinary Tract: No abnormality visualized. Bleeding from the pelvic fractures around the anterior inferior aspect of the bladder. Bowel:  Unremarkable visualized pelvic bowel loops. Vascular/Lymphatic: Extensive aortic atherosclerosis. No adenopathy. Reproductive:  Hysterectomy. No adnexal masses. Other:  None  IMPRESSION: 1. Comminuted displaced fractures of the right pubic body and medial aspects of the right superior and inferior pubic rami. 2. Vertical fracture through the right sacral ala. 3. Hemorrhage into the soft tissues adjacent to the symphysis pubis without a discrete hematoma. Aortic Atherosclerosis (ICD10-I70.0). Electronically Signed   By: Francene Boyers M.D.   On: 10/23/2019 15:11   DG Hip Unilat W or Wo Pelvis 2-3 Views Right  Result Date: 10/23/2019 CLINICAL DATA:  Fall with right-sided hip pain. EXAM: DG HIP (WITH OR WITHOUT PELVIS) 2-3V RIGHT COMPARISON:  None. FINDINGS: No evidence of right-sided hip fracture or dislocation. There is potentially some mild irregularity involving the right superior pubic ramus and a superior pubic ramus fracture cannot be excluded. No evidence of pelvic diastasis. No bony lesions identified. There is mild osteoarthritis of the right hip joint. IMPRESSION: No evidence of right hip fracture. Possible right superior pubic ramus fracture. Electronically Signed   By: Irish Lack M.D.   On: 10/23/2019 14:06    Assessment/Plan  #1 history of pelvic fractures-at this point recommendation is for conservative care.  Emphasis on pain management she does have orders for OxyIR 5 mg every 4 hours as needed in addition to Tylenol 500 mg twice daily and  Voltaren gel once a day.  It appears pain is more associated with movement at this point will need to be monitored.  Also will be receiving PT and OT.  2.  Atrial fibrillation appears rate controlled she is on amiodarone 100 mg a day-she is not on anticoagulation most likely secondary to her advanced age-at this point appears to be rate controlled.  3.  History of coronary artery disease this appears stable she does not complain of any chest pain shortness of breath I do not note any increased edema.-She does have orders for as needed nitro  4.  History of hypertension she is on Norvasc 2.5 mg a day blood  pressure today 136/68 at this point will monitor.  5.  History of allergic rhinitis she continues on Allegra 180 mg a day she also has orders for as needed Flonase.   6-- : Anemia most likely chronic hemoglobin was 10.3 on most recent lab will have this updated.  CPT-993 10-of note greater than 35 minutes spent assessing patient-discussing her status with nursing staff-reviewing her chart and labs-coordinating and formulating a plan of care-of note greater than 50% of time spent coordinating plan of care with input as noted above

## 2019-10-26 NOTE — Progress Notes (Signed)
Report called to Faith at [facility].

## 2019-10-26 NOTE — Discharge Summary (Signed)
Physician Discharge Summary  Jordan [name] UQJ:[id number] DOB: 1916/01/11 DOA: 10/23/2019  PCP: Merlene Laughter, MD  Admit date: 10/23/2019 Discharge date: 10/26/2019  Admitted From: Home Disposition:  SNF   Recommendations for Outpatient Follow-up:  1. Follow up with PCP in 1 week  2. Follow up with Dr. Roda Shutters in 2-4 weeks   Discharge Condition: Stable CODE STATUS: DNR   Diet recommendation: Regular diet / Heart healthy diet   Brief/Interim Summary: Jordan [name] a 86 y.o.femalewith medical history significant ofhypertension, hyperlipidemia, paroxysmal A. fib not on anticoagulation probably secondary to age, hearing impairment, coronary disease, aortic insufficiency presented with fall and hip pain. Patient is very hard of hearing and a poor historian. Patient lives at home alone and apparently fell today. Since then, she has been having hip pain and inability to ambulate.CT of the pelvis showed comminuted displaced fractures of the right pubic body and medial aspects of the right superior and inferior pubic rami along with vertical fracture of the right sacral ala along with hemorrhage into the soft tissues adjacent to the symphysis pubis without a discrete hematoma. Orthopedics/Dr. Estrellita Ludwig consulted who recommended conservative management.   Discharge Diagnoses:  Principal Problem:   Hip fracture (HCC) Active Problems:   Coronary artery disease   Hyperlipidemia   Hypertension   Paroxysmal atrial fibrillation (HCC)   Malnutrition of moderate degree   Acute pelvicfracture secondary to fall -Patient presented with a fall and CT of the pelvis shows comminuted displaced fractures of the right pubic body and medial aspects of the right superior and inferior pubic rami along with vertical fracture through the right sacral ala with hemorrhage into the soft tissues adjacent to the symphysis pubis without a discrete hematoma -Orthopedics/Dr.Xuhas been consulted by the ED provider who  recommended conservative management.  Follow-up as an outpatient with repeat imaging in 2 to 4 weeks. -Fall precautions. Pain management. PT/OT eval recommending SNF.   Lumbar spinal stenosis at L3-4 -As evidenced on CT of the lumbar spine. No evidence of acute fracture -There is old benign compression fracture of T12 -PT/OT eval recommending SNF.   Paroxysmal A. Fib -Has intermittent bradycardia. Not on anticoagulation probably secondary to advanced age. Continue amiodarone -Outpatient follow-up with PCP/cardiology  Hypertension -Continue amlodipine  History of coronary artery disease -Stable. Outpatient follow-up    Discharge Instructions  Discharge Instructions    Call MD for:  difficulty breathing, headache or visual disturbances   Complete by: As directed    Call MD for:  extreme fatigue   Complete by: As directed    Call MD for:  persistant dizziness or light-headedness   Complete by: As directed    Call MD for:  persistant nausea and vomiting   Complete by: As directed    Call MD for:  severe uncontrolled pain   Complete by: As directed    Call MD for:  temperature >100.4   Complete by: As directed    Diet - low sodium heart healthy   Complete by: As directed    Diet general   Complete by: As directed    Increase activity slowly   Complete by: As directed    Increase activity slowly   Complete by: As directed      Allergies as of 10/26/2019      Reactions   Cafergot    unk   Cephalexin    Unknown    Clinoril [sulindac]    Unknown    Codeine    Unknown  Dolobid [diflunisal]    Unknown    Equagesic    Unknown    Flu Virus Vaccine    Unknown    Hyzaar [losartan Potassium-hctz]    Unknown    Neosporin [neomycin-polymyxin-gramicidin]    Unknown    Other    BEE STING   Pce [erythromycin Base]    Unknown    Prednisone    Unknown    Tetracyclines & Related    Unknown       Medication List    TAKE these medications    acetaminophen 500 MG tablet Commonly known as: TYLENOL Take 500 mg by mouth 2 (two) times daily.   amLODipine 5 MG tablet Commonly known as: NORVASC Take 0.5 tablets (2.5 mg total) by mouth daily.   ARTHRO-COMPLEX PO Take 1 tablet by mouth 2 (two) times daily.   brimonidine 0.2 % ophthalmic solution Commonly known as: ALPHAGAN Place 1 drop into the right eye in the morning and at bedtime.   Caltrate 600+D Plus 600-400 MG-UNIT per tablet Take 1 tablet by mouth daily.   diclofenac Sodium 1 % Gel Commonly known as: VOLTAREN Apply 2 g topically daily as needed for pain.   fexofenadine 180 MG tablet Commonly known as: ALLEGRA Take 180 mg by mouth daily.   Fish Oil 1000 MG Caps Take 1,000 mg by mouth daily. Omegaadvance/1000 mg Omega 3S Triglyceride form   fluticasone 50 MCG/ACT nasal spray Commonly known as: FLONASE Place 1-2 sprays into both nostrils daily as needed for allergies.   Lumigan 0.01 % Soln Generic drug: bimatoprost Place 1 drop into both eyes at bedtime.   multivitamin with minerals Tabs tablet Take 1 tablet by mouth daily.   multivitamin-lutein Caps capsule Take 1 capsule by mouth 2 (two) times daily.   nitroGLYCERIN 0.4 MG SL tablet Commonly known as: NITROSTAT Place 0.4 mg under the tongue every 5 (five) minutes as needed for chest pain.   omeprazole 20 MG capsule Commonly known as: PRILOSEC Take 20 mg by mouth daily.   oxyCODONE 5 MG immediate release tablet Commonly known as: Oxy IR/ROXICODONE Take 1 tablet (5 mg total) by mouth every 4 (four) hours as needed for moderate pain.   Pacerone 100 MG tablet Generic drug: amiodarone Take 1 tablet by mouth once daily What changed: how much to take   vitamin C 500 MG tablet Commonly known as: ASCORBIC ACID Take 500 mg by mouth daily.       Contact information for follow-up providers    Stoneking, Ann MakiHal, MD. Schedule an appointment as soon as possible for a visit in 1 week(s).   Specialty:  Internal Medicine Contact information: [address] [place] [address] [phone]        Tarry KosXu, Naiping M, MD. Schedule an appointment as soon as possible for a visit in 2 week(s).   Specialty: Orthopedic Surgery Contact information: [address] [place] [phone]            Contact information for after-discharge care    Destination    [facility] Preferred SNF .   Service: Skilled Nursing Contact information: [address] [place] [id number] [phone]                 Allergies  Allergen Reactions  . Cafergot     unk  . Cephalexin     Unknown   . Clinoril [Sulindac]     Unknown   . Codeine  Unknown   . Dolobid [Diflunisal]     Unknown   . Equagesic     Unknown   . Flu Virus Vaccine     Unknown   . Hyzaar [Losartan Potassium-Hctz]     Unknown   . Neosporin [Neomycin-Polymyxin-Gramicidin]     Unknown   . Other     BEE STING  . Pce [Erythromycin Base]     Unknown   . Prednisone     Unknown   . Tetracyclines & Related     Unknown     Consultations:  Orthopedic surgery    Procedures/Studies: DG Chest 1 View  Result Date: 10/23/2019 CLINICAL DATA:  Patient with anterior hip pain status post fall. EXAM: CHEST  1 VIEW COMPARISON:  Chest radiograph 05/24/2019. FINDINGS: Monitoring leads overlie the patient. Stable enlarged cardiac and mediastinal contours status post median sternotomy. No large area of pulmonary consolidation. No pleural effusion or pneumothorax. Leftward curvature thoracic spine. Degenerative changes. IMPRESSION: No acute cardiopulmonary process. Electronically Signed   By: Annia Belt M.D.   On: 10/23/2019 14:04   CT LUMBAR SPINE WO CONTRAST  Result Date: 10/23/2019 CLINICAL DATA:  Low back pain and right hip pain secondary to a fall at home. EXAM: CT LUMBAR SPINE WITHOUT CONTRAST TECHNIQUE: Multidetector CT imaging of the lumbar spine was  performed without intravenous contrast administration. Multiplanar CT image reconstructions were also generated. COMPARISON:  Lumbar radiographs dated 01/21/2012 and chest x-ray dated 05/24/2019 and 04/20/2018 FINDINGS: Segmentation: 5 lumbar type vertebrae. Alignment: 2 mm retrolisthesis of L2 on L3. 2 mm anterolisthesis at L3-4 and L4-5. 3 mm protrusion of posterosuperior aspect of the T12 vertebral body into the spinal canal secondary to the old compression fracture. Mild compound thoracolumbar scoliosis. Vertebrae: There is an old moderately severe compression fracture of T12, unchanged since 05/24/2019. Slight protrusion of the posterosuperior aspect of T12 into the spinal canal without significant compression of the thecal sac. The fracture does not involve the posterior elements. There is what is probably an acute fracture of the anterior inferior aspect of the right sacral ala. There is a more subtle fracture of the superior aspect of the right sacral ala. Paraspinal and other soft tissues: Aortic atherosclerosis. Small stones in the left kidney. 2.7 cm cyst on the lower pole of the left kidney. Disc levels: T10-11: No significant abnormality. T11-12: Disc degeneration. No disc bulging or protrusion. Old benign compression fracture of T12 with slight protrusion of bone into the spinal canal without focal neural impingement. T12-L1: Small calcified disc bulge to the left of midline with a bulge in and lateral to the left neural foramen without neural impingement. L1-2: Narrowing of the right side of the L1-2 disc space. No disc bulging or protrusion. L2-3: Disc degeneration. Small broad-based disc bulge with a protrusion into the right lateral recess and right neural foramen without neural impingement. Minimal retrolisthesis of L2 on L3. L3-4: Disc degeneration. 2 mm spondylolisthesis. Small broad-based soft disc protrusion. Hypertrophy of the ligamentum flavum and facet joints combine to create fairly severe  spinal stenosis. There is a protrusion into the left neural foramen which could affect the left L3 nerve, image 68 of series 4. Prominent degenerative changes between the spinous processes of L3 and L4. L4-5: 2 mm spondylolisthesis. Disc space narrowing with a vacuum phenomenon. Small broad-based disc bulge. Hypertrophy of the ligamentum flavum and facet joints combine to create mild spinal stenosis there is moderate impingement upon the left lateral recess which could affect the left  L5 nerve. No foraminal stenosis. Prominent degenerative changes between the spinous processes of L4 on L5. L5-S1: Moderate right and slight left facet arthritis. No disc bulging or protrusion. IMPRESSION: 1. Acute fracture 2. Moderately severe spinal stenosis at L3-4. 3. Left lateral recess stenosis at L4-5. This could affect the left L5 nerve. 4. Old benign compression fracture of T12. Aortic Atherosclerosis (ICD10-I70.0). Electronically Signed   By: Lorriane Shire M.D.   On: 10/23/2019 15:05   CT PELVIS WO CONTRAST  Result Date: 10/23/2019 CLINICAL DATA:  Right pelvic tenderness after a fall. Right hip pain. EXAM: CT PELVIS WITHOUT CONTRAST TECHNIQUE: Multidetector CT imaging of the pelvis was performed following the standard protocol without intravenous contrast. COMPARISON:  Pelvic radiograph dated 10/23/2019 FINDINGS: Musculoskeletal: There is a comminuted displaced fracture through the right pubic body and of the medial aspects of the right superior and inferior pubic rami with displacement. There is hemorrhage into the adjacent soft tissues without a discrete hematoma. This extends around the anterior inferior aspect of the bladder. There is also a vertical fracture through the right sacral ala. The proximal femurs are intact. No dislocation. Urinary Tract: No abnormality visualized. Bleeding from the pelvic fractures around the anterior inferior aspect of the bladder. Bowel:  Unremarkable visualized pelvic bowel loops.  Vascular/Lymphatic: Extensive aortic atherosclerosis. No adenopathy. Reproductive:  Hysterectomy. No adnexal masses. Other:  None IMPRESSION: 1. Comminuted displaced fractures of the right pubic body and medial aspects of the right superior and inferior pubic rami. 2. Vertical fracture through the right sacral ala. 3. Hemorrhage into the soft tissues adjacent to the symphysis pubis without a discrete hematoma. Aortic Atherosclerosis (ICD10-I70.0). Electronically Signed   By: Lorriane Shire M.D.   On: 10/23/2019 15:11   DG Hip Unilat W or Wo Pelvis 2-3 Views Right  Result Date: 10/23/2019 CLINICAL DATA:  Fall with right-sided hip pain. EXAM: DG HIP (WITH OR WITHOUT PELVIS) 2-3V RIGHT COMPARISON:  None. FINDINGS: No evidence of right-sided hip fracture or dislocation. There is potentially some mild irregularity involving the right superior pubic ramus and a superior pubic ramus fracture cannot be excluded. No evidence of pelvic diastasis. No bony lesions identified. There is mild osteoarthritis of the right hip joint. IMPRESSION: No evidence of right hip fracture. Possible right superior pubic ramus fracture. Electronically Signed   By: Aletta Edouard M.D.   On: 10/23/2019 14:06       Discharge Exam: Vitals:   10/25/19 2222 10/26/19 0523  BP: (!) 150/49 (!) 149/60  Pulse: 64 75  Resp: 18 18  Temp: 97.6 F (36.4 C) 99.4 F (37.4 C)  SpO2: 95% 94%    General: Pt is alert, awake, not in acute distress, hard of hearing  Cardiovascular: RRR, S1/S2 +, no edema Respiratory: CTA bilaterally, no wheezing, no rhonchi, no respiratory distress, no conversational dyspnea, room air  Abdominal: Soft, NT, ND, bowel sounds + Extremities: no edema, no cyanosis Psych: Normal mood and affect    The results of significant diagnostics from this hospitalization (including imaging, microbiology, ancillary and laboratory) are listed below for reference.     Microbiology: Recent Results (from the past 240  hour(s))  SARS CORONAVIRUS 2 (TAT 6-24 HRS) Nasopharyngeal Nasopharyngeal Swab     Status: None   Collection Time: 10/23/19  4:13 PM   Specimen: Nasopharyngeal Swab  Result Value Ref Range Status   SARS Coronavirus 2 NEGATIVE NEGATIVE Final    Comment: (NOTE) SARS-CoV-2 target nucleic acids are NOT DETECTED. The SARS-CoV-2 RNA is  generally detectable in upper and lower respiratory specimens during the acute phase of infection. Negative results do not preclude SARS-CoV-2 infection, do not rule out co-infections with other pathogens, and should not be used as the sole basis for treatment or other patient management decisions. Negative results must be combined with clinical observations, patient history, and epidemiological information. The expected result is Negative. Fact Sheet for Patients: HairSlick.no Fact Sheet for Healthcare Providers: quierodirigir.com This test is not yet approved or cleared by the [place] FDA and  has been authorized for detection and/or diagnosis of SARS-CoV-2 by FDA under an Emergency Use Authorization (EUA). This EUA will remain  in effect (meaning this test can be used) for the duration of the COVID-19 declaration under Section 56 4(b)(1) of the Act, 21 U.S.C. section 360bbb-3(b)(1), unless the authorization is terminated or revoked sooner. Performed at [facility], [address] [place], [place] [address]      Labs: BNP (last 3 results) No results for input(s): BNP in the last 8760 hours. Basic Metabolic Panel: Recent Labs  Lab 10/23/19 1314 10/24/19 0425  NA 141 139  K 4.1 3.8  CL 106 108  CO2 24 22  GLUCOSE 106* 105*  BUN 22 24*  CREATININE 0.89 0.69  CALCIUM 10.0 8.7*  MG  --  2.0   Liver Function Tests: Recent Labs  Lab 10/23/19 1314  AST 30  ALT 25  ALKPHOS 75  BILITOT 0.4  PROT 7.9  ALBUMIN 4.2   No results for input(s): LIPASE, AMYLASE in the last 168  hours. No results for input(s): AMMONIA in the last 168 hours. CBC: Recent Labs  Lab 10/23/19 1314 10/24/19 0425  WBC 13.5* 9.0  NEUTROABS 10.3*  --   HGB 13.7 10.3*  HCT 43.1 32.6*  MCV 98.6 97.3  PLT 307 237   Cardiac Enzymes: No results for input(s): CKTOTAL, CKMB, CKMBINDEX, TROPONINI in the last 168 hours. BNP: Invalid input(s): POCBNP CBG: No results for input(s): GLUCAP in the last 168 hours. D-Dimer No results for input(s): DDIMER in the last 72 hours. Hgb A1c No results for input(s): HGBA1C in the last 72 hours. Lipid Profile No results for input(s): CHOL, HDL, LDLCALC, TRIG, CHOLHDL, LDLDIRECT in the last 72 hours. Thyroid function studies No results for input(s): TSH, T4TOTAL, T3FREE, THYROIDAB in the last 72 hours.  Invalid input(s): FREET3 Anemia work up No results for input(s): VITAMINB12, FOLATE, FERRITIN, TIBC, IRON, RETICCTPCT in the last 72 hours. Urinalysis No results found for: COLORURINE, APPEARANCEUR, LABSPEC, PHURINE, GLUCOSEU, HGBUR, BILIRUBINUR, KETONESUR, PROTEINUR, UROBILINOGEN, NITRITE, LEUKOCYTESUR Sepsis Labs Invalid input(s): PROCALCITONIN,  WBC,  LACTICIDVEN Microbiology Recent Results (from the past 240 hour(s))  SARS CORONAVIRUS 2 (TAT 6-24 HRS) Nasopharyngeal Nasopharyngeal Swab     Status: None   Collection Time: 10/23/19  4:13 PM   Specimen: Nasopharyngeal Swab  Result Value Ref Range Status   SARS Coronavirus 2 NEGATIVE NEGATIVE Final    Comment: (NOTE) SARS-CoV-2 target nucleic acids are NOT DETECTED. The SARS-CoV-2 RNA is generally detectable in upper and lower respiratory specimens during the acute phase of infection. Negative results do not preclude SARS-CoV-2 infection, do not rule out co-infections with other pathogens, and should not be used as the sole basis for treatment or other patient management decisions. Negative results must be combined with clinical observations, patient history, and epidemiological information.  The expected result is Negative. Fact Sheet for Patients: HairSlick.no Fact Sheet for Healthcare Providers: quierodirigir.com This test is not yet approved or  cleared by the [place] and  has been authorized for detection and/or diagnosis of SARS-CoV-2 by FDA under an Emergency Use Authorization (EUA). This EUA will remain  in effect (meaning this test can be used) for the duration of the COVID-19 declaration under Section 56 4(b)(1) of the Act, 21 U.S.C. section 360bbb-3(b)(1), unless the authorization is terminated or revoked sooner. Performed at [facility], [address] [place], [place] [address]      Patient was seen and examined on the day of discharge and was found to be in stable condition. Time coordinating discharge: 35 minutes including assessment and coordination of care, as well as examination of the patient.   SIGNED:  Noralee Stain, DO [facility] 10/26/2019, 9:17 AM

## 2019-10-26 NOTE — TOC Transition Note (Signed)
Transition of Care [facility]) - CM/SW Discharge Note   Patient Details  Name: Jordan [name] MRN: [id number] Date of Birth: March 08, 1916  [facility] of [facility] CM/SW Contact:  Amada Jupiter, LCSW Phone Number: 10/26/2019, 10:42 AM   Clinical Narrative:  Received insurance auth [id number]) for SNF transfer.  Pt, grandson and nursing aware and have called [facility] for pick up.  Grandson to meet pt at facility.  No further needs.     Final next level of care: Skilled Nursing Facility Barriers to Discharge: Barriers Resolved   Patient Goals and CMS Choice Patient states their goals for this hospitalization and ongoing recovery are:: Pt's goal is to go home. CMS Medicare.gov Compare Post Acute Care list provided to:: Patient Represenative (must comment)(grandson, Renae Fickle) Choice offered to / list presented to : (grandson)  Discharge Placement              Patient chooses bed at: [facility] Patient to be transferred to facility by: [facility] Name of family member notified: grandson, Anda Kraft Patient and family notified of of transfer: 10/26/19  Discharge Plan and Services In-house Referral: Clinical Social Work                                   Social Determinants of Health (SDOH) Interventions     Readmission Risk Interventions Readmission Risk Prevention Plan 10/24/2019  Transportation Screening Complete  PCP or Specialist Appt within 5-7 Days Complete  Home Care Screening Complete  Medication Review (RN CM) Complete  Some recent data might be hidden

## 2019-10-27 LAB — CBC AND DIFFERENTIAL
HCT: 31 — AB (ref 36–46)
Hemoglobin: 10.4 — AB (ref 12.0–16.0)
Neutrophils Absolute: 9
Platelets: 226 (ref 150–399)
WBC: 11.8

## 2019-10-27 LAB — BASIC METABOLIC PANEL
BUN: 25 — AB (ref 4–21)
CO2: 21 (ref 13–22)
Chloride: 104 (ref 99–108)
Creatinine: 0.6 (ref 0.5–1.1)
Glucose: 95
Potassium: 3.8 (ref 3.4–5.3)
Sodium: 138 (ref 137–147)

## 2019-10-27 LAB — COMPREHENSIVE METABOLIC PANEL
Calcium: 8.5 — AB (ref 8.7–10.7)
GFR calc Af Amer: 87.36
GFR calc non Af Amer: 75.37

## 2019-10-27 LAB — CBC: RBC: 3.32 — AB (ref 3.87–5.11)

## 2019-10-29 ENCOUNTER — Encounter: Payer: Self-pay | Admitting: Internal Medicine

## 2019-10-31 ENCOUNTER — Non-Acute Institutional Stay (SKILLED_NURSING_FACILITY): Payer: Medicare Other | Admitting: Internal Medicine

## 2019-10-31 DIAGNOSIS — S32592S Other specified fracture of left pubis, sequela: Secondary | ICD-10-CM

## 2019-10-31 DIAGNOSIS — I2510 Atherosclerotic heart disease of native coronary artery without angina pectoris: Secondary | ICD-10-CM

## 2019-10-31 DIAGNOSIS — S32110S Nondisplaced Zone I fracture of sacrum, sequela: Secondary | ICD-10-CM

## 2019-10-31 DIAGNOSIS — I10 Essential (primary) hypertension: Secondary | ICD-10-CM

## 2019-10-31 DIAGNOSIS — S32591S Other specified fracture of right pubis, sequela: Secondary | ICD-10-CM

## 2019-10-31 DIAGNOSIS — I480 Paroxysmal atrial fibrillation: Secondary | ICD-10-CM | POA: Diagnosis not present

## 2019-10-31 LAB — CBC AND DIFFERENTIAL
HCT: 32 — AB (ref 36–46)
Hemoglobin: 10.3 — AB (ref 12.0–16.0)
Neutrophils Absolute: 6
Platelets: 444 — AB (ref 150–399)
WBC: 9.2

## 2019-10-31 LAB — CBC: RBC: 3.34 — AB (ref 3.87–5.11)

## 2019-10-31 NOTE — Progress Notes (Signed)
: Provider:  Margit Hanks., MD Location:  [facility] Room Number: 114-P Place of Service:  SNF (31)  PCP: Merlene Laughter, MD Patient Care Team: Merlene Laughter, MD as PCP - General (Internal Medicine)  Extended Emergency Contact Information Primary Emergency Contact: Anda Kraft Address: [address]          [place], [place] [address] [place] Phone: [phone] Relation: Grandson Secondary Emergency Contact: Judeth Horn, [place] Home Phone: [phone] Relation: Friend     Allergies: Cafergot, Cephalexin, Clinoril [sulindac], Codeine, Dolobid [diflunisal], Equagesic, Flu virus vaccine, Hyzaar [losartan potassium-hctz], Neosporin [neomycin-polymyxin-gramicidin], Other, Pce [erythromycin base], Prednisone, and Tetracyclines & related  Chief Complaint  Patient presents with  . New Admit To SNF    New admission to [facility] SN[facility]    HPI: Patient is a 86 y.o. female with hypertension, hyperlipidemia, paroxysmal atrial flutter, not on anticoagulation secondary to age, hearing impairment, CAD, aortic sufficiency who presented to [facility] long hospital after a fall.  Hip pain.  CT of the pelvis in the ED showed comminuted displaced fractures of the right pubic body in the right superior inferior pubic rami along with a vertical fracture of the right sacral ala with hemorrhage into the soft tissues adjacent to the pubic symphysis.  Patient was admitted to [facility] long hospital from 3/28-31 for acute pelvic fracture secondary to fall.  Orthopedics was consulted and recommended conservative management.  Patient is admitted to skilled nursing facility for OT/PT.  While at skilled nursing facility patient will be followed for paroxysmal atrial fibrillation treated with amiodarone, hypertension treated with Norvasc and coronary artery disease treated with as needed sublingual nitroglycerin.  Past  Medical History:  Diagnosis Date  . Acute midline low back pain without sciatica 06/08/2019  . Aortic insufficiency    MILD TO MODERATE  . Chest pain   . Coronary artery disease   . Dizziness   . Hearing loss   . Hyperlipidemia   . Hypertension   . Pain in left wrist 06/08/2019  . Palpitations   . Paroxysmal atrial fibrillation (HCC) 10/04/2018  . PVC's (premature ventricular contractions)   . SOB (shortness of breath)   . Spinal stenosis     Past Surgical History:  Procedure Laterality Date  . ABDOMINAL HYSTERECTOMY    . APPENDECTOMY    . CARDIAC CATHETERIZATION  01/12/1996   THE EF IS DIFFICULT TO ESTIMATE DUE TO PVCS. SEVERE TWO-VESSEL OBSTRUCTIVE ATHERSCLEROTIC CAD WITH COMPLEX STENOSIS IN THE LAD AND DIAGONAL VESSELS  . CARDIAC CATHETERIZATION  07/29/1991   EF 80-85%  . CORONARY ARTERY BYPASS GRAFT  1997  . INGUINAL HERNIA REPAIR    . OVARIAN CYST REMOVAL    . TONSILLECTOMY AND ADENOIDECTOMY      Allergies as of 10/31/2019      Reactions   Cafergot    unk   Cephalexin    Unknown    Clinoril [sulindac]    Unknown    Codeine    Unknown    Dolobid [diflunisal]    Unknown    Equagesic    Unknown    Flu Virus Vaccine    Unknown    Hyzaar [losartan Potassium-hctz]    Unknown    Neosporin [neomycin-polymyxin-gramicidin]    Unknown    Other    BEE STING   Pce [erythromycin Base]    Unknown    Prednisone  Unknown    Tetracyclines & Related    Unknown       Medication List       Accurate as of October 31, 2019 11:59 PM. If you have any questions, ask your nurse or doctor.        acetaminophen 500 MG tablet Commonly known as: TYLENOL Take 500 mg by mouth 2 (two) times daily.   amLODipine 5 MG tablet Commonly known as: NORVASC Take 0.5 tablets (2.5 mg total) by mouth daily.   bisacodyl 10 MG suppository Commonly known as: DULCOLAX If not relieved by Milk of Magnesia, give one Bisacodyl suppository (10mg ) per rectum X 1 dose in 24 hours PRN. (Do not  use constipation standing orders for resident with renal failure [GFR less than 30] Contact MD for orders).   brimonidine 0.2 % ophthalmic solution Commonly known as: ALPHAGAN Place 1 drop into the right eye in the morning and at bedtime.   Caltrate 600+D Plus 600-400 MG-UNIT per tablet Take 1 tablet by mouth daily.   diclofenac Sodium 1 % Gel Commonly known as: VOLTAREN Apply 2 g topically daily as needed for pain.   fexofenadine 180 MG tablet Commonly known as: ALLEGRA Take 180 mg by mouth daily.   Fish Oil 1000 MG Caps Take 1,000 mg by mouth daily. Omegaadvance/1000 mg Omega 3S Triglyceride form   fluticasone 50 MCG/ACT nasal spray Commonly known as: FLONASE Place 2 sprays into both nostrils daily as needed for allergies.   Glucosamine-Chondroitin Caps Take 1 capsule by mouth 2 (two) times daily.   Lumigan 0.01 % Soln Generic drug: bimatoprost Place 1 drop into both eyes at bedtime.   magnesium hydroxide 400 MG/5ML suspension Commonly known as: MILK OF MAGNESIA If no BM in 3 days, give Milk of Magnesia 30cc p.o. X 1 dose in 24 hours PRN (Do not use constipation standing orders for resident with renal failure [GFR less than 30] Contact MD for orders).   multivitamin with minerals Tabs tablet Take 1 tablet by mouth daily.   nitroGLYCERIN 0.4 MG SL tablet Commonly known as: NITROSTAT Place 0.4 mg under the tongue every 5 (five) minutes as needed for chest pain.   NON FORMULARY DIET: REGULAR, NAS, HEART HEALTHY   omeprazole 20 MG capsule Commonly known as: PRILOSEC Take 20 mg by mouth daily.   oxyCODONE 5 MG immediate release tablet Commonly known as: Oxy IR/ROXICODONE Take 1 tablet (5 mg total) by mouth every 4 (four) hours as needed for moderate pain.   Pacerone 100 MG tablet Generic drug: amiodarone Take 1 tablet by mouth once daily   PreserVision AREDS Caps Take 1 capsule by mouth 2 (two) times daily.   RA SALINE ENEMA RE Place rectally. If not relieved  by suppository, give Saline Laxative Enema (disposable) X 1 per rectum in 24 hours PRN (Do not use constipation standing orders for resident with renal failure [GFR less than 30] Contact MD for orders).   vitamin C 500 MG tablet Commonly known as: ASCORBIC ACID Take 500 mg by mouth daily.       No orders of the defined types were placed in this encounter.   Immunization History  Administered Date(s) Administered  . Influenza, High Dose Seasonal PF 05/03/2019  . Pneumococcal Polysaccharide-23 05/03/2019  . Tdap 01/21/2012    Social History   Tobacco Use  . Smoking status: Never Smoker  . Smokeless tobacco: Never Used  Substance Use Topics  . Alcohol use: No    Family history is  Family History  Problem Relation Age of Onset  . Heart disease Mother   . Heart disease Father   . Nephritis Brother       Review of Systems  GENERAL:  no fevers, fatigue, appetite changes SKIN: No itching, or rash EYES: No eye pain, redness, discharge EARS: No earache, tinnitus, change in hearing NOSE: No congestion, drainage or bleeding  MOUTH/THROAT: No mouth or tooth pain, No sore throat RESPIRATORY: No cough, wheezing, SOB CARDIAC: No chest pain, palpitations, lower extremity edema  GI: No abdominal pain, No N/V/D or constipation, No heartburn or reflux  GU: No dysuria, frequency or urgency, or incontinence  MUSCULOSKELETAL: No unrelieved bone/joint pain NEUROLOGIC: No headache, dizziness or focal weakness PSYCHIATRIC: No c/o anxiety or sadness   Vitals:   10/31/19 1611  BP: (!) 154/54  Pulse: 67  Resp: 18  Temp: 97.7 F (36.5 C)  SpO2: 94%    SpO2 Readings from Last 1 Encounters:  10/31/19 94%   Body mass index is 17.72 kg/m.     Physical Exam  GENERAL APPEARANCE: Alert, conversant,  No acute distress.  SKIN: No diaphoresis rash HEAD: Normocephalic, atraumatic  EYES: Conjunctiva/lids clear. Pupils round, reactive. EOMs intact.  EARS: External exam WNL, canals  clear. Hearing grossly normal.  NOSE: No deformity or discharge.  MOUTH/THROAT: Lips w/o lesions  RESPIRATORY: Breathing is even, unlabored. Lung sounds are clear   CARDIOVASCULAR: Heart RRR no murmurs, rubs or gallops. No peripheral edema.   GASTROINTESTINAL: Abdomen is soft, non-tender, not distended w/ normal bowel sounds. GENITOURINARY: Bladder non tender, not distended  MUSCULOSKELETAL: No abnormal joints or musculature NEUROLOGIC:  Cranial nerves 2-12 grossly intact. Moves all extremities  PSYCHIATRIC: Mood and affect appropriate to situation, no behavioral issues  Patient Active Problem List   Diagnosis Date Noted  . Malnutrition of moderate degree 10/24/2019  . Hip fracture ([place]) 10/23/2019  . Acute midline low back pain without sciatica 06/08/2019  . Pain in left wrist 06/08/2019  . Paroxysmal atrial fibrillation ([place]) 10/04/2018  . Chest pain   . SOB (shortness of breath)   . Coronary artery disease   . Hyperlipidemia   . Hypertension   . Aortic insufficiency       Labs reviewed: Basic Metabolic Panel:    Component Value Date/Time   NA 138 10/27/2019 0000   K 3.8 10/27/2019 0000   CL 104 10/27/2019 0000   CO2 21 10/27/2019 0000   GLUCOSE 105 (H) 10/24/2019 0425   BUN 25 (A) 10/27/2019 0000   CREATININE 0.6 10/27/2019 0000   CREATININE 0.69 10/24/2019 0425   CALCIUM 8.5 (A) 10/27/2019 0000   PROT 7.9 10/23/2019 1314   PROT 7.6 10/11/2019 1204   ALBUMIN 4.2 10/23/2019 1314   ALBUMIN 4.5 10/11/2019 1204   AST 30 10/23/2019 1314   ALT 25 10/23/2019 1314   ALKPHOS 75 10/23/2019 1314   BILITOT 0.4 10/23/2019 1314   BILITOT 0.3 10/11/2019 1204   GFRNONAA 75.37 10/27/2019 0000   GFRAA 87.36 10/27/2019 0000    Recent Labs    10/11/19 1204 10/11/19 1204 10/23/19 1314 10/24/19 0425 10/27/19 0000  NA 138  --  141 139 138  K 4.4   < > 4.1 3.8 3.8  CL 102   < > 106 108 104  CO2 21   < > 24 22 21   GLUCOSE 91  --  106* 105*  --   BUN 22  --  22 24* 25*    CREATININE 0.87   < >  0.89 0.69 0.6  CALCIUM 9.9   < > 10.0 8.7* 8.5*  MG  --   --   --  2.0  --    < > = values in this interval not displayed.   Liver Function Tests: Recent Labs    08/17/19 1432 10/11/19 1204 10/23/19 1314  AST ALT ALKPHOS 119* 88 75  BILITOT 0.3 0.3 0.4  PROT 7.4 7.6 7.9  ALBUMIN 4.6 4.5 4.2   No results for input(s): LIPASE, AMYLASE in the last 8760 hours. No results for input(s): AMMONIA in the last 8760 hours. CBC: Recent Labs    10/11/19 1204 10/23/19 1314 10/24/19 0425 10/27/19 0000  WBC 8.7 13.5* 9.0 11.8  NEUTROABS 4.9 10.3*  --  9  HGB 13.5 13.7 10.3* 10.4*  HCT 41.0 43.1 32.6* 31*  MCV 96 98.6 97.3  --   PLT 366 307 237 226   Lipid No results for input(s): CHOL, HDL, LDLCALC, TRIG in the last 8760 hours.  Cardiac Enzymes: No results for input(s): CKTOTAL, CKMB, CKMBINDEX, TROPONINI in the last 8760 hours. BNP: No results for input(s): BNP in the last 8760 hours. No results found for: MICROALBUR No results found for: HGBA1C Lab Results  Component Value Date   TSH 4.320 10/11/2019   No results found for: VITAMINB12 No results found for: FOLATE No results found for: IRON, TIBC, FERRITIN  Imaging and Procedures obtained prior to SNF admission: DG Chest 1 View  Result Date: 10/23/2019 CLINICAL DATA:  Patient with anterior hip pain status post fall. EXAM: CHEST  1 VIEW COMPARISON:  Chest radiograph 05/24/2019. FINDINGS: Monitoring leads overlie the patient. Stable enlarged cardiac and mediastinal contours status post median sternotomy. No large area of pulmonary consolidation. No pleural effusion or pneumothorax. Leftward curvature thoracic spine. Degenerative changes. IMPRESSION: No acute cardiopulmonary process. Electronically Signed   By: Annia Belt M.D.   On: 10/23/2019 14:04   CT LUMBAR SPINE WO CONTRAST  Result Date: 10/23/2019 CLINICAL DATA:  Low back pain and right hip pain secondary to a fall at home.  EXAM: CT LUMBAR SPINE WITHOUT CONTRAST TECHNIQUE: Multidetector CT imaging of the lumbar spine was performed without intravenous contrast administration. Multiplanar CT image reconstructions were also generated. COMPARISON:  Lumbar radiographs dated 01/21/2012 and chest x-ray dated 05/24/2019 and 04/20/2018 FINDINGS: Segmentation: 5 lumbar type vertebrae. Alignment: 2 mm retrolisthesis of L2 on L3. 2 mm anterolisthesis at L3-4 and L4-5. 3 mm protrusion of posterosuperior aspect of the T12 vertebral body into the spinal canal secondary to the old compression fracture. Mild compound thoracolumbar scoliosis. Vertebrae: There is an old moderately severe compression fracture of T12, unchanged since 05/24/2019. Slight protrusion of the posterosuperior aspect of T12 into the spinal canal without significant compression of the thecal sac. The fracture does not involve the posterior elements. There is what is probably an acute fracture of the anterior inferior aspect of the right sacral ala. There is a more subtle fracture of the superior aspect of the right sacral ala. Paraspinal and other soft tissues: Aortic atherosclerosis. Small stones in the left kidney. 2.7 cm cyst on the lower pole of the left kidney. Disc levels: T10-11: No significant abnormality. T11-12: Disc degeneration. No disc bulging or protrusion. Old benign compression fracture of T12 with slight protrusion of bone into the spinal canal without focal neural impingement. T12-L1: Small calcified disc bulge to the left of midline with a bulge in and lateral to the left neural  foramen without neural impingement. L1-2: Narrowing of the right side of the L1-2 disc space. No disc bulging or protrusion. L2-3: Disc degeneration. Small broad-based disc bulge with a protrusion into the right lateral recess and right neural foramen without neural impingement. Minimal retrolisthesis of L2 on L3. L3-4: Disc degeneration. 2 mm spondylolisthesis. Small broad-based soft  disc protrusion. Hypertrophy of the ligamentum flavum and facet joints combine to create fairly severe spinal stenosis. There is a protrusion into the left neural foramen which could affect the left L3 nerve, image 68 of series 4. Prominent degenerative changes between the spinous processes of L3 and L4. L4-5: 2 mm spondylolisthesis. Disc space narrowing with a vacuum phenomenon. Small broad-based disc bulge. Hypertrophy of the ligamentum flavum and facet joints combine to create mild spinal stenosis there is moderate impingement upon the left lateral recess which could affect the left L5 nerve. No foraminal stenosis. Prominent degenerative changes between the spinous processes of L4 on L5. L5-S1: Moderate right and slight left facet arthritis. No disc bulging or protrusion. IMPRESSION: 1. Acute fracture 2. Moderately severe spinal stenosis at L3-4. 3. Left lateral recess stenosis at L4-5. This could affect the left L5 nerve. 4. Old benign compression fracture of T12. Aortic Atherosclerosis (ICD10-I70.0). Electronically Signed   By: Francene Boyers M.D.   On: 10/23/2019 15:05   CT PELVIS WO CONTRAST  Result Date: 10/23/2019 CLINICAL DATA:  Right pelvic tenderness after a fall. Right hip pain. EXAM: CT PELVIS WITHOUT CONTRAST TECHNIQUE: Multidetector CT imaging of the pelvis was performed following the standard protocol without intravenous contrast. COMPARISON:  Pelvic radiograph dated 10/23/2019 FINDINGS: Musculoskeletal: There is a comminuted displaced fracture through the right pubic body and of the medial aspects of the right superior and inferior pubic rami with displacement. There is hemorrhage into the adjacent soft tissues without a discrete hematoma. This extends around the anterior inferior aspect of the bladder. There is also a vertical fracture through the right sacral ala. The proximal femurs are intact. No dislocation. Urinary Tract: No abnormality visualized. Bleeding from the pelvic fractures  around the anterior inferior aspect of the bladder. Bowel:  Unremarkable visualized pelvic bowel loops. Vascular/Lymphatic: Extensive aortic atherosclerosis. No adenopathy. Reproductive:  Hysterectomy. No adnexal masses. Other:  None IMPRESSION: 1. Comminuted displaced fractures of the right pubic body and medial aspects of the right superior and inferior pubic rami. 2. Vertical fracture through the right sacral ala. 3. Hemorrhage into the soft tissues adjacent to the symphysis pubis without a discrete hematoma. Aortic Atherosclerosis (ICD10-I70.0). Electronically Signed   By: Francene Boyers M.D.   On: 10/23/2019 15:11   DG Hip Unilat W or Wo Pelvis 2-3 Views Right  Result Date: 10/23/2019 CLINICAL DATA:  Fall with right-sided hip pain. EXAM: DG HIP (WITH OR WITHOUT PELVIS) 2-3V RIGHT COMPARISON:  None. FINDINGS: No evidence of right-sided hip fracture or dislocation. There is potentially some mild irregularity involving the right superior pubic ramus and a superior pubic ramus fracture cannot be excluded. No evidence of pelvic diastasis. No bony lesions identified. There is mild osteoarthritis of the right hip joint. IMPRESSION: No evidence of right hip fracture. Possible right superior pubic ramus fracture. Electronically Signed   By: Irish Lack M.D.   On: 10/23/2019 14:06     Not all labs, radiology exams or other studies done during hospitalization come through on my EPIC note; however they are reviewed by me.    Assessment and Plan  Acute pelvic fracture secondary to fall-comminuted displaced fractures  of the right pubic body, right superior and inferior pubic rami fracture fracture of the right sacral ala and soft tissue hemorrhage; orthopedics recommends conservative management SNF-admitted for OT/PT; will schedule oxycodone 5 mg 3 times daily  Paroxysmal atrial fibrillation-patient had intermittent bradycardia; not on anticoagulation secondary to SNF-continue amiodarone 100 mg  daily  Hypertension SNF-controlled for age; continue Norvasc 2.5 mg daily; will increase to 5 mg daily if blood pressure remains above 150  Coronary artery disease SNF-treated only with nitroglycerin sublingual   Time spent greater than 45 minutes;> 50% of time with patient was spent reviewing records, labs, tests and studies, counseling and developing plan of care  Margit HanksAnne D Glynis Hunsucker, MD

## 2019-11-01 ENCOUNTER — Encounter: Payer: Self-pay | Admitting: Internal Medicine

## 2019-11-05 ENCOUNTER — Encounter: Payer: Self-pay | Admitting: Internal Medicine

## 2019-11-05 DIAGNOSIS — S32509A Unspecified fracture of unspecified pubis, initial encounter for closed fracture: Secondary | ICD-10-CM | POA: Insufficient documentation

## 2019-11-05 DIAGNOSIS — S3210XA Unspecified fracture of sacrum, initial encounter for closed fracture: Secondary | ICD-10-CM | POA: Insufficient documentation

## 2019-11-05 DIAGNOSIS — S32591S Other specified fracture of right pubis, sequela: Secondary | ICD-10-CM | POA: Insufficient documentation

## 2019-11-05 DIAGNOSIS — S32592S Other specified fracture of left pubis, sequela: Secondary | ICD-10-CM | POA: Insufficient documentation

## 2019-11-08 ENCOUNTER — Ambulatory Visit: Payer: Self-pay

## 2019-11-08 ENCOUNTER — Ambulatory Visit (INDEPENDENT_AMBULATORY_CARE_PROVIDER_SITE_OTHER): Payer: Medicare Other | Admitting: Orthopaedic Surgery

## 2019-11-08 ENCOUNTER — Encounter: Payer: Self-pay | Admitting: Orthopaedic Surgery

## 2019-11-08 ENCOUNTER — Other Ambulatory Visit: Payer: Self-pay

## 2019-11-08 DIAGNOSIS — W19XXXA Unspecified fall, initial encounter: Secondary | ICD-10-CM

## 2019-11-08 DIAGNOSIS — W19XXXD Unspecified fall, subsequent encounter: Secondary | ICD-10-CM

## 2019-11-08 DIAGNOSIS — S3282XA Multiple fractures of pelvis without disruption of pelvic ring, initial encounter for closed fracture: Secondary | ICD-10-CM

## 2019-11-08 NOTE — Progress Notes (Signed)
Office Visit Note   Patient: Jordan [name]           Date of Birth: 10-28-1915           MRN: [id number] Visit Date: 11/08/2019              Requested by: Lajean Manes, MD [address] [place],  [place] [address] PCP: Lajean Manes, MD   Assessment & Plan: Visit Diagnoses:  1. Fall, initial encounter   2. Fall, subsequent encounter     Plan: Impression is 2 weeks status post right superior pubic ramus fracture.  The patient can now begin weightbearing as tolerated.  Continue with physical therapy.  She will follow-up with [place] in 4 weeks time for repeat evaluation and AP pelvis x-rays.  Follow-Up Instructions: Return in about 4 weeks (around 12/06/2019).   Orders:  Orders Placed This Encounter  Procedures  . XR Pelvis 1-2 Views   No orders of the defined types were placed in this encounter.     Procedures: No procedures performed   Clinical Data: No additional findings.   Subjective: Chief Complaint  Patient presents with  . Pelvis - Follow-up    HPI patient is a pleasant 86 year old female who comes in today for follow-up of her right superior pubic ramus fracture which occurred approximately 2 weeks ago.  She has been in [facility] doing okay.  She is getting some physical therapy.  She notes that she was ambulating with a walker prior to her fall and has not started to ambulate with a walker as of yet.  She is in a wheelchair today.  She has minimal pain to the right hip/buttocks.  Review of Systems as detailed in HPI.  All others reviewed and are negative.   Objective: Vital Signs: There were no vitals taken for this visit.  Physical Exam well-developed well-nourished female no acute distress.  Alert oriented x3.  Ortho Exam examination of her right lower extremity reveals painless range of motion of the hip.  She does not appear to be uncomfortable sitting in her wheelchair.  Specialty Comments:  No specialty comments  available.  Imaging: XR Pelvis 1-2 Views  Result Date: 11/08/2019 X-rays demonstrate stable right pubic ramus fracture    PMFS History: Patient Active Problem List   Diagnosis Date Noted  . Fracture of pubis ([place]) 11/05/2019  . Bilateral fracture of pubic rami, sequela 11/05/2019  . Fracture of sacrum ([place]) 11/05/2019  . Malnutrition of moderate degree 10/24/2019  . Hip fracture ([place]) 10/23/2019  . Acute midline low back pain without sciatica 06/08/2019  . Pain in left wrist 06/08/2019  . Paroxysmal atrial fibrillation ([place]) 10/04/2018  . Chest pain   . SOB (shortness of breath)   . Coronary artery disease   . Hyperlipidemia   . Hypertension   . Aortic insufficiency    Past Medical History:  Diagnosis Date  . Acute midline low back pain without sciatica 06/08/2019  . Aortic insufficiency    MILD TO MODERATE  . Chest pain   . Coronary artery disease   . Dizziness   . Hearing loss   . Hyperlipidemia   . Hypertension   . Pain in left wrist 06/08/2019  . Palpitations   . Paroxysmal atrial fibrillation ([place]) 10/04/2018  . PVC's (premature ventricular contractions)   . SOB (shortness of breath)   . Spinal stenosis     Family History  Problem Relation Age of Onset  . Heart  disease Mother   . Heart disease Father   . Nephritis Brother     Past Surgical History:  Procedure Laterality Date  . ABDOMINAL HYSTERECTOMY    . APPENDECTOMY    . CARDIAC CATHETERIZATION  01/12/1996   THE EF IS DIFFICULT TO ESTIMATE DUE TO PVCS. SEVERE TWO-VESSEL OBSTRUCTIVE ATHERSCLEROTIC CAD WITH COMPLEX STENOSIS IN THE LAD AND DIAGONAL VESSELS  . CARDIAC CATHETERIZATION  07/29/1991   EF 80-85%  . CORONARY ARTERY BYPASS GRAFT  1997  . INGUINAL HERNIA REPAIR    . OVARIAN CYST REMOVAL    . TONSILLECTOMY AND ADENOIDECTOMY     Social History   Occupational History  . Not on file  Tobacco Use  . Smoking status: Never Smoker  . Smokeless tobacco: Never Used  Substance and Sexual Activity   . Alcohol use: No  . Drug use: No  . Sexual activity: Not on file

## 2019-11-10 ENCOUNTER — Encounter: Payer: Self-pay | Admitting: Internal Medicine

## 2019-11-10 ENCOUNTER — Non-Acute Institutional Stay (SKILLED_NURSING_FACILITY): Payer: Medicare Other | Admitting: Internal Medicine

## 2019-11-10 DIAGNOSIS — I2510 Atherosclerotic heart disease of native coronary artery without angina pectoris: Secondary | ICD-10-CM

## 2019-11-10 DIAGNOSIS — S32591S Other specified fracture of right pubis, sequela: Secondary | ICD-10-CM

## 2019-11-10 DIAGNOSIS — I10 Essential (primary) hypertension: Secondary | ICD-10-CM | POA: Diagnosis not present

## 2019-11-10 DIAGNOSIS — I480 Paroxysmal atrial fibrillation: Secondary | ICD-10-CM

## 2019-11-10 NOTE — Progress Notes (Signed)
Location:    [facility] Room Number: 114/P Place of Service:  SNF [phone] Provider:  Argentina DonovanArlo Avi Archuleta  Stoneking, Hal, MD  Patient Care Team: Merlene LaughterStoneking, Hal, MD as PCP - General (Internal Medicine)  Extended Emergency Contact Information Primary Emergency Contact: Anda KraftSteelman, Paul Address: [address]          [place], [place] [address] [place] Mobile Phone: [phone] Relation: Grandson Secondary Emergency Contact: Judeth HornWillett,Pat          [place] Home Phone: [phone] Relation: Friend  Code Status:  Full Code Goals of care: Advanced Directive information Advanced Directives 11/10/2019  Does Patient Have a Medical Advance Directive? Yes  Type of Advance Directive (No Data)  Does patient want to make changes to medical advance directive? No - Patient declined  Would patient like information on creating a medical advance directive? -     Chief Complaint  Patient presents with   Follow-up    2 Week Follow Pelvic Fracture    HPI:  Pt is a [age] y.o. female seen today for an acute visit for follow-up of her right pelvic fracture.  Patient before her hospital admission had a fall at home and sustainedcomminuted displaced fractures of the right pubic body and medial aspects of the right superior and inferior pubic rami along with vertical fracture of the right sacral ala along with hemorrhage into the soft tissues adjacent to the symphysis pubis without a discrete hematoma Orthopedics was consulted and recommended conservative management.  She appears to have done well with this she does not require any more oxycodone it appears pain appears to be controlled with Tylenol twice a day.  She also has just seen orthopedics and recommendation was to begin weightbearing as tolerated.  Her other diagnoses include atrial fibrillation not on anticoagulation because of her age and fall risk she is on amiodarone 100  mg a day.  She also has a history of hypertension she is on Norvasc 2.5 mg a day appears to have some variable systolics but because of her advanced age we have been pretty conservative with this.  She also has a history of coronary artery disease which is been stable during her stay here she does have orders for nitro as needed.  She also has a history of allergic rhinitis and continues on Allegra 100 mg a day and Flonase as needed.  Currently she is resting in bed comfortably she does not really complain of pain she continues to be bright alert very pleasant   Past Medical History:  Diagnosis Date   Acute midline low back pain without sciatica 06/08/2019   Aortic insufficiency    MILD TO MODERATE   Chest pain    Coronary artery disease    Dizziness    Hearing loss    Hyperlipidemia    Hypertension    Pain in left wrist 06/08/2019   Palpitations    Paroxysmal atrial fibrillation (HCC) 10/04/2018   PVC's (premature ventricular contractions)    SOB (shortness of breath)    Spinal stenosis    Past Surgical History:  Procedure Laterality Date   ABDOMINAL HYSTERECTOMY     APPENDECTOMY     CARDIAC CATHETERIZATION  01/12/1996   THE EF IS DIFFICULT TO ESTIMATE DUE TO PVCS. SEVERE TWO-VESSEL OBSTRUCTIVE ATHERSCLEROTIC CAD WITH COMPLEX STENOSIS IN THE LAD AND DIAGONAL VESSELS   CARDIAC CATHETERIZATION  07/29/1991   EF 80-85%   CORONARY ARTERY BYPASS  GRAFT  1997   INGUINAL HERNIA REPAIR     OVARIAN CYST REMOVAL     TONSILLECTOMY AND ADENOIDECTOMY      Allergies  Allergen Reactions   Cafergot     unk   Cephalexin     Unknown    Clinoril [Sulindac]     Unknown    Codeine     Unknown    Dolobid [Diflunisal]     Unknown    Equagesic     Unknown    Flu Virus Vaccine     Unknown    Hyzaar [Losartan Potassium-Hctz]     Unknown    Neosporin [Neomycin-Polymyxin-Gramicidin]     Unknown    Other     BEE STING   Pce [Erythromycin Base]      Unknown    Prednisone     Unknown    Tetracyclines & Related     Unknown     Outpatient Encounter Medications as of 11/10/2019  Medication Sig   acetaminophen (TYLENOL) 500 MG tablet Take 500 mg by mouth 2 (two) times daily.   amLODipine (NORVASC) 5 MG tablet Take 0.5 tablets (2.5 mg total) by mouth daily.   bisacodyl (DULCOLAX) 10 MG suppository If not relieved by Milk of Magnesia, give one Bisacodyl suppository (10mg ) per rectum X 1 dose in 24 hours PRN. (Do not use constipation standing orders for resident with renal failure [GFR less than 30] Contact MD for orders).   brimonidine (ALPHAGAN) 0.2 % ophthalmic solution Place 1 drop into the right eye in the morning and at bedtime.    Calcium Carbonate-Vit D-Min (CALTRATE 600+D PLUS) 600-400 MG-UNIT per tablet Take 1 tablet by mouth daily.     diclofenac Sodium (VOLTAREN) 1 % GEL Apply 2 g topically daily as needed for pain.   feeding supplement, ENSURE ENLIVE, (ENSURE ENLIVE) LIQD Take 237 mLs by mouth 2 (two) times daily between meals.   fexofenadine (ALLEGRA) 180 MG tablet Take 180 mg by mouth daily.   fluticasone (FLONASE) 50 MCG/ACT nasal spray Place 2 sprays into both nostrils daily as needed for allergies.    Glucosamine-Chondroit-Vit C-Mn (GLUCOSAMINE-CHONDROITIN) CAPS Take 1 capsule by mouth 2 (two) times daily.   LUMIGAN 0.01 % SOLN Place 1 drop into both eyes at bedtime.    magnesium hydroxide (MILK OF MAGNESIA) 400 MG/5ML suspension If no BM in 3 days, give Milk of Magnesia 30cc p.o. X 1 dose in 24 hours PRN (Do not use constipation standing orders for resident with renal failure [GFR less than 30] Contact MD for orders).   Multiple Vitamin (MULTIVITAMIN WITH MINERALS) TABS Take 1 tablet by mouth daily.   Multiple Vitamins-Minerals (PRESERVISION AREDS) CAPS Take 1 capsule by mouth 2 (two) times daily.   nitroGLYCERIN (NITROSTAT) 0.4 MG SL tablet Place 0.4 mg under the tongue every 5 (five) minutes as needed for  chest pain.    NON FORMULARY Regular liberalized diet d/t Dx Malnutrition and poor intake.   Omega-3 Fatty Acids (FISH OIL) 1000 MG CAPS Take 1,000 mg by mouth daily. Omegaadvance/1000 mg Omega 3S Triglyceride form   omeprazole (PRILOSEC) 20 MG capsule Take 20 mg by mouth daily.    oxyCODONE (OXY IR/ROXICODONE) 5 MG immediate release tablet Take 1 tablet (5 mg total) by mouth every 4 (four) hours as needed for moderate pain.   PACERONE 100 MG tablet Take 1 tablet by mouth once daily   Sodium Phosphates (RA SALINE ENEMA RE) Place rectally. If not relieved by suppository, give  Saline Laxative Enema (disposable) X 1 per rectum in 24 hours PRN (Do not use constipation standing orders for resident with renal failure [GFR less than 30] Contact MD for orders).   vitamin C (ASCORBIC ACID) 500 MG tablet Take 500 mg by mouth daily.    No facility-administered encounter medications on file as of 11/10/2019.    Review of Systems   General she is not complaining of any fever or chills.  Skin does not complain of rashes or itching.  Head ears eyes nose mouth and throat is not complaining of visual changes or sore throat.  Respiratory does not complain of being short of breath or having a cough.  Cardiac does not complain of chest pain or increasing edema.  GI is not complaining of abdominal pain nausea vomiting diarrhea constipation.  GU no complaints of dysuria.  Musculoskeletal does have a history of the pelvic fractures but is not really complaining of pain at this time apparently pain is controlled when she does therapy.  Neurologic does not complain of dizziness headache numbness or feeling syncopal.  And psych not complaining of being depressed or anxious.    Immunization History  Administered Date(s) Administered   Influenza, High Dose Seasonal PF 05/03/2019   Moderna SARS-COVID-2 Vaccination 09/26/2019   Pneumococcal Polysaccharide-23 05/03/2019   Tdap 01/21/2012    Pertinent  Health Maintenance Due  Topic Date Due   INFLUENZA VACCINE  02/26/2020   PNA vac Low Risk Adult (2 of 2 - PCV13) 05/02/2020   DEXA SCAN  Completed   No flowsheet data found. Functional Status Survey:    Vitals:   11/10/19 1621  BP: (!) 155/65  Pulse: (!) 59  Resp: 17  Temp: (!) 97.5 F (36.4 C)  TempSrc: Oral  SpO2: 94%  Weight: 96 lb 14.4 oz (44 kg)  Height:  (1.575 m)   Body mass index is 17.72 kg/m. Physical Exam   General this is a pleasant elderly female in no distress lying comfortably in bed.  Her skin is warm and dry.  Eyes visual acuity appears to be intact sclera and conjunctive are clear.  Oropharynx clear mucous membranes moist.  Chest is clear to auscultation there is no labored breathing.  Heart is regular rate and rhythm minimally bradycardic at 58 there is no lower extremity edema of concern.  Abdomen is soft nontender with positive bowel sounds.  Musculoskeletal Limited exam since he is in bed but is able to move all extremities x4 there is minimal discomfort to palpation when I palpate her right pelvic hip area.  Neurologic appears grossly intact her speech is clear cranial nerves intact cannot appreciate lateralizing findings.  Psych she continues to be pleasant and appropriate  Labs reviewed: Recent Labs    10/11/19 1204 10/11/19 1204 10/23/19 1314 10/24/19 0425 10/27/19 0000  NA 138  --  141 139 138  K 4.4   < > 4.1 3.8 3.8  CL 102   < > 106 108 104  CO2 21   < > GLUCOSE 91  --  106* 105*  --   BUN 22  --  22 24* 25*  CREATININE 0.87   < > 0.89 0.69 0.6  CALCIUM 9.9   < > 10.0 8.7* 8.5*  MG  --   --   --  2.0  --    < > = values in this interval not displayed.   Recent Labs    08/17/19 1432 10/11/19 1204  10/23/19 1314  AST 20 21 30   ALT 14 14 25   ALKPHOS 119* 88 75  BILITOT 0.3 0.3 0.4  PROT 7.4 7.6 7.9  ALBUMIN 4.6 4.5 4.2   Recent Labs    10/11/19 1204 10/23/19 1314 10/23/19 1314  10/24/19 0425 10/27/19 0000 10/31/19 0000  WBC 8.7 13.5*   < > 9.0 11.8 9.2  NEUTROABS 4.9 10.3*  --   --  9 6  HGB 13.5 13.7   < > 10.3* 10.4* 10.3*  HCT 41.0 43.1   < > 32.6* 31* 32*  MCV 96 98.6  --  97.3  --   --   PLT 366 307   < > 237 226 444*   < > = values in this interval not displayed.   Lab Results  Component Value Date   TSH 4.320 10/11/2019   No results found for: HGBA1C No results found for: CHOL, HDL, LDLCALC, LDLDIRECT, TRIG, CHOLHDL  Significant Diagnostic Results in last 30 days:  DG Chest 1 View  Result Date: 10/23/2019 CLINICAL DATA:  Patient with anterior hip pain status post fall. EXAM: CHEST  1 VIEW COMPARISON:  Chest radiograph 05/24/2019. FINDINGS: Monitoring leads overlie the patient. Stable enlarged cardiac and mediastinal contours status post median sternotomy. No large area of pulmonary consolidation. No pleural effusion or pneumothorax. Leftward curvature thoracic spine. Degenerative changes. IMPRESSION: No acute cardiopulmonary process. Electronically Signed   By: 10/25/2019 M.D.   On: 10/23/2019 14:04   CT LUMBAR SPINE WO CONTRAST  Result Date: 10/23/2019 CLINICAL DATA:  Low back pain and right hip pain secondary to a fall at home. EXAM: CT LUMBAR SPINE WITHOUT CONTRAST TECHNIQUE: Multidetector CT imaging of the lumbar spine was performed without intravenous contrast administration. Multiplanar CT image reconstructions were also generated. COMPARISON:  Lumbar radiographs dated 01/21/2012 and chest x-ray dated 05/24/2019 and 04/20/2018 FINDINGS: Segmentation: 5 lumbar type vertebrae. Alignment: 2 mm retrolisthesis of L2 on L3. 2 mm anterolisthesis at L3-4 and L4-5. 3 mm protrusion of posterosuperior aspect of the T12 vertebral body into the spinal canal secondary to the old compression fracture. Mild compound thoracolumbar scoliosis. Vertebrae: There is an old moderately severe compression fracture of T12, unchanged since 05/24/2019. Slight protrusion of the  posterosuperior aspect of T12 into the spinal canal without significant compression of the thecal sac. The fracture does not involve the posterior elements. There is what is probably an acute fracture of the anterior inferior aspect of the right sacral ala. There is a more subtle fracture of the superior aspect of the right sacral ala. Paraspinal and other soft tissues: Aortic atherosclerosis. Small stones in the left kidney. 2.7 cm cyst on the lower pole of the left kidney. Disc levels: T10-11: No significant abnormality. T11-12: Disc degeneration. No disc bulging or protrusion. Old benign compression fracture of T12 with slight protrusion of bone into the spinal canal without focal neural impingement. T12-L1: Small calcified disc bulge to the left of midline with a bulge in and lateral to the left neural foramen without neural impingement. L1-2: Narrowing of the right side of the L1-2 disc space. No disc bulging or protrusion. L2-3: Disc degeneration. Small broad-based disc bulge with a protrusion into the right lateral recess and right neural foramen without neural impingement. Minimal retrolisthesis of L2 on L3. L3-4: Disc degeneration. 2 mm spondylolisthesis. Small broad-based soft disc protrusion. Hypertrophy of the ligamentum flavum and facet joints combine to create fairly severe spinal stenosis. There is a protrusion into the left neural  foramen which could affect the left L3 nerve, image 68 of series 4. Prominent degenerative changes between the spinous processes of L3 and L4. L4-5: 2 mm spondylolisthesis. Disc space narrowing with a vacuum phenomenon. Small broad-based disc bulge. Hypertrophy of the ligamentum flavum and facet joints combine to create mild spinal stenosis there is moderate impingement upon the left lateral recess which could affect the left L5 nerve. No foraminal stenosis. Prominent degenerative changes between the spinous processes of L4 on L5. L5-S1: Moderate right and slight left facet  arthritis. No disc bulging or protrusion. IMPRESSION: 1. Acute fracture 2. Moderately severe spinal stenosis at L3-4. 3. Left lateral recess stenosis at L4-5. This could affect the left L5 nerve. 4. Old benign compression fracture of T12. Aortic Atherosclerosis (ICD10-I70.0). Electronically Signed   By: Lorriane Shire M.D.   On: 10/23/2019 15:05   CT PELVIS WO CONTRAST  Result Date: 10/23/2019 CLINICAL DATA:  Right pelvic tenderness after a fall. Right hip pain. EXAM: CT PELVIS WITHOUT CONTRAST TECHNIQUE: Multidetector CT imaging of the pelvis was performed following the standard protocol without intravenous contrast. COMPARISON:  Pelvic radiograph dated 10/23/2019 FINDINGS: Musculoskeletal: There is a comminuted displaced fracture through the right pubic body and of the medial aspects of the right superior and inferior pubic rami with displacement. There is hemorrhage into the adjacent soft tissues without a discrete hematoma. This extends around the anterior inferior aspect of the bladder. There is also a vertical fracture through the right sacral ala. The proximal femurs are intact. No dislocation. Urinary Tract: No abnormality visualized. Bleeding from the pelvic fractures around the anterior inferior aspect of the bladder. Bowel:  Unremarkable visualized pelvic bowel loops. Vascular/Lymphatic: Extensive aortic atherosclerosis. No adenopathy. Reproductive:  Hysterectomy. No adnexal masses. Other:  None IMPRESSION: 1. Comminuted displaced fractures of the right pubic body and medial aspects of the right superior and inferior pubic rami. 2. Vertical fracture through the right sacral ala. 3. Hemorrhage into the soft tissues adjacent to the symphysis pubis without a discrete hematoma. Aortic Atherosclerosis (ICD10-I70.0). Electronically Signed   By: Lorriane Shire M.D.   On: 10/23/2019 15:11   DG Hip Unilat W or Wo Pelvis 2-3 Views Right  Result Date: 10/23/2019 CLINICAL DATA:  Fall with right-sided hip  pain. EXAM: DG HIP (WITH OR WITHOUT PELVIS) 2-3V RIGHT COMPARISON:  None. FINDINGS: No evidence of right-sided hip fracture or dislocation. There is potentially some mild irregularity involving the right superior pubic ramus and a superior pubic ramus fracture cannot be excluded. No evidence of pelvic diastasis. No bony lesions identified. There is mild osteoarthritis of the right hip joint. IMPRESSION: No evidence of right hip fracture. Possible right superior pubic ramus fracture. Electronically Signed   By: Aletta Edouard M.D.   On: 10/23/2019 14:06   XR Pelvis 1-2 Views  Result Date: 11/08/2019 X-rays demonstrate stable right pubic ramus fracture   Assessment/Plan  #1 history of right pelvic fractures-she has just seen orthopedics with recommendation for weightbearing as tolerated.  Her pain appears to be controlled she does have orders for Tylenol 500 mg twice daily also for Voltaren gel-she no longer appears to require the OxyContin.  2.  Atrial fibrillation this appears rate controlled she is not on an anticoagulation secondary to advanced age and I suspect fall risk-she is on amiodarone 100 mg a day.  3.  History of coronary artery disease appears stable and asymptomatic she does have nitro as needed if needed I do not believe she is required this.  4.  Hypertension she is on Norvasc 2.5 mg a day.  She has some variability of her blood pressure systolically in the 150s today but this does not appear to be consistent at this point will monitor.  5.  History of mild leukocytosis-white count was 11,800 on lab done April 1 however updated 1 on April 5 and had come down to 9.2-hemoglobin was relatively stable with the earlier lab at 10.3.  She is not complaining of any fever chills dysuria shortness of breath cough congestion or increased diarrhea.  6.  History of allergic rhinitis she continues on Allegra she also has Flonase as needed.  This appears to be  stable.  [id number]

## 2019-11-12 ENCOUNTER — Encounter: Payer: Self-pay | Admitting: Internal Medicine

## 2019-11-19 ENCOUNTER — Encounter: Payer: Self-pay | Admitting: Cardiology

## 2019-11-29 ENCOUNTER — Encounter: Payer: Self-pay | Admitting: Internal Medicine

## 2019-11-29 ENCOUNTER — Non-Acute Institutional Stay (SKILLED_NURSING_FACILITY): Payer: Medicare Other | Admitting: Internal Medicine

## 2019-11-29 DIAGNOSIS — S32591S Other specified fracture of right pubis, sequela: Secondary | ICD-10-CM

## 2019-11-29 DIAGNOSIS — I10 Essential (primary) hypertension: Secondary | ICD-10-CM | POA: Diagnosis not present

## 2019-11-29 DIAGNOSIS — J309 Allergic rhinitis, unspecified: Secondary | ICD-10-CM | POA: Diagnosis not present

## 2019-11-29 DIAGNOSIS — I480 Paroxysmal atrial fibrillation: Secondary | ICD-10-CM

## 2019-11-29 NOTE — Progress Notes (Signed)
Location:    [facility] Room Number: 106/P Place of Service:  SNF (31) Provider:  Willia CrazeLassen,Blaike Vickers, PA-C  Stoneking, Hal, MD  Patient Care Team: Merlene LaughterStoneking, Hal, MD as PCP - General (Internal Medicine)  Extended Emergency Contact Information Primary Emergency Contact: Anda KraftSteelman, Paul Address: [address]          [place], [place] [id number] [place] Mobile Phone: [phone] Relation: Grandson Secondary Emergency Contact: Judeth HornWillett,Pat          [place], [place] Home Phone: [phone] Relation: Friend  Code Status:  Full Code Goals of care: Advanced Directive information Advanced Directives 11/29/2019  Does Patient Have a Medical Advance Directive? Yes  Type of Advance Directive (No Data)  Does patient want to make changes to medical advance directive? No - Patient declined  Would patient like information on creating a medical advance directive? -    Chief complaint-routine visit for medical management of chronic medical conditions including recent pelvic fracture-atrial fibrillation-hypertension-coronary artery disease.  And allergic rhinitis   HPI:  Pt is a [age] y.o. female seen today for medical management of chronic diseases as noted above.  Nursing does not report any acute issues he does have a history of pelvic fracture sustained after a fall.   These were comminuted displaced fractures of the right pubic body and medial aspects of the right superior and inferior pubic rami along with vertical fracture of the right sacral ala along with hemorrhage into the soft tissues adjacent to the symphysis pubis without a discrete hematoma Orthopedics was consulted and recommended conservative management  At this point her pain appears to be controlled with the Tylenol 500 mg twice daily she also has orders for Voltaren gel.  There have been recommendation for weightbearing as tolerated.  Her son actually is visiting  her today-apparently therapy has been discontinued-I did speak with the therapist and they will discuss this with her son.  In regards to her other issues she does have a history of hypertension systolics at times appear to be somewhat elevated above 150 the average is just slightly above 150-per review of previous recommendation will increase her low-dose Norvasc up to 5 mg a day and monitor.  Systolics do range it appears from the 130s to 160s.  Regards to atrial fibrillation she is not on anticoagulation because of her age and fall risk-she is on amiodarone 100 mg a day this appears to be rate controlled.  Regarding coronary artery disease she does continue on nitro as needed again she is thought not to be a good candidate for anticoagulation-I suspect she is not on a statin because of her advanced age.  She does have a history of allergic rhinitis she does have orders for Allegra and Flonase as needed.  Currently she is resting in bed comfortably-vital signs appear to be stable as noted her blood pressure at times are somewhat elevated  Past Medical History:  Diagnosis Date  . Acute midline low back pain without sciatica 06/08/2019  . Aortic insufficiency    MILD TO MODERATE  . Chest pain   . Coronary artery disease   . Dizziness   . Hearing loss   . Hyperlipidemia   . Hypertension   . Pain in left wrist 06/08/2019  . Palpitations   . Paroxysmal atrial fibrillation (HCC) 10/04/2018  . PVC's (premature ventricular contractions)   . SOB (shortness of breath)   . Spinal stenosis    Past  Surgical History:  Procedure Laterality Date  . ABDOMINAL HYSTERECTOMY    . APPENDECTOMY    . CARDIAC CATHETERIZATION  01/12/1996   THE EF IS DIFFICULT TO ESTIMATE DUE TO PVCS. SEVERE TWO-VESSEL OBSTRUCTIVE ATHERSCLEROTIC CAD WITH COMPLEX STENOSIS IN THE LAD AND DIAGONAL VESSELS  . CARDIAC CATHETERIZATION  07/29/1991   EF 80-85%  . CORONARY ARTERY BYPASS GRAFT  1997  . INGUINAL HERNIA REPAIR     . OVARIAN CYST REMOVAL    . TONSILLECTOMY AND ADENOIDECTOMY      Allergies  Allergen Reactions  . Cafergot     unk  . Cephalexin     Unknown   . Clinoril [Sulindac]     Unknown   . Codeine     Unknown   . Dolobid [Diflunisal]     Unknown   . Equagesic     Unknown   . Flu Virus Vaccine     Unknown   . Hyzaar [Losartan Potassium-Hctz]     Unknown   . Neosporin [Neomycin-Polymyxin-Gramicidin]     Unknown   . Other     BEE STING  . Pce [Erythromycin Base]     Unknown   . Prednisone     Unknown   . Tetracyclines & Related     Unknown     Allergies as of 11/29/2019      Reactions   Cafergot    unk   Cephalexin    Unknown    Clinoril [sulindac]    Unknown    Codeine    Unknown    Dolobid [diflunisal]    Unknown    Equagesic    Unknown    Flu Virus Vaccine    Unknown    Hyzaar [losartan Potassium-hctz]    Unknown    Neosporin [neomycin-polymyxin-gramicidin]    Unknown    Other    BEE STING   Pce [erythromycin Base]    Unknown    Prednisone    Unknown    Tetracyclines & Related    Unknown       Medication List       Accurate as of Nov 29, 2019 11:19 AM. If you have any questions, ask your nurse or doctor.        STOP taking these medications   oxyCODONE 5 MG immediate release tablet Commonly known as: Oxy IR/ROXICODONE Stopped by: Granville Lewis, PA-C     TAKE these medications   acetaminophen 500 MG tablet Commonly known as: TYLENOL Take 500 mg by mouth 2 (two) times daily.   amLODipine 5 MG tablet Commonly known as: NORVASC Take 0.5 tablets (2.5 mg total) by mouth daily.   bisacodyl 10 MG suppository Commonly known as: DULCOLAX If not relieved by [place], give one Bisacodyl suppository (10mg ) per rectum X 1 dose in 24 hours PRN. (Do not use constipation standing orders for resident with renal failure [GFR less than 30] Contact MD for orders).   brimonidine 0.2 % ophthalmic solution Commonly known as: ALPHAGAN Place 1 drop into  the right eye in the morning and at bedtime.   Caltrate 600+D Plus 600-400 MG-UNIT per tablet Take 1 tablet by mouth daily.   diclofenac Sodium 1 % Gel Commonly known as: VOLTAREN Apply 2 g topically daily as needed for pain.   dorzolamide-timolol 22.3-6.8 MG/ML ophthalmic solution Commonly known as: COSOPT Place 1 drop into both eyes 2 (two) times daily. For Glaucoma   feeding supplement (ENSURE ENLIVE) Liqd Take 237 mLs by mouth 2 (  two) times daily between meals.   fexofenadine 180 MG tablet Commonly known as: ALLEGRA Take 180 mg by mouth daily.   Fish Oil 1000 MG Caps Take 1,000 mg by mouth daily. Omegaadvance/1000 mg Omega 3S Triglyceride form   fluticasone 50 MCG/ACT nasal spray Commonly known as: FLONASE Place 2 sprays into both nostrils daily as needed for allergies.   Glucosamine-Chondroitin Caps Take 1 capsule by mouth 2 (two) times daily.   Lumigan 0.01 % Soln Generic drug: bimatoprost Place 1 drop into both eyes at bedtime.   magnesium hydroxide 400 MG/5ML suspension Commonly known as: MILK OF MAGNESIA If no BM in 3 days, give Milk of Magnesia 30cc p.o. X 1 dose in 24 hours PRN (Do not use constipation standing orders for resident with renal failure [GFR less than 30] Contact MD for orders).   multivitamin with minerals Tabs tablet Take 1 tablet by mouth daily.   nitroGLYCERIN 0.4 MG SL tablet Commonly known as: NITROSTAT Place 0.4 mg under the tongue every 5 (five) minutes as needed for chest pain.   NON FORMULARY Regular liberalized diet d/t Dx Malnutrition and poor intake.   omeprazole 20 MG capsule Commonly known as: PRILOSEC Take 20 mg by mouth daily.   Pacerone 100 MG tablet Generic drug: amiodarone Take 1 tablet by mouth once daily   PreserVision AREDS Caps Take 1 capsule by mouth 2 (two) times daily.   RA SALINE ENEMA RE Place rectally. If not relieved by suppository, give Saline Laxative Enema (disposable) X 1 per rectum in 24 hours PRN  (Do not use constipation standing orders for resident with renal failure [GFR less than 30] Contact MD for orders).   vitamin C 500 MG tablet Commonly known as: ASCORBIC ACID Take 500 mg by mouth daily.       Review of Systems   In general she is not complaining of any fever or chills.  Skin does not complain of rashes itching or diaphoresis.  Head ears eyes nose mouth and throat is not complain of visual changes or sore throat.  Respiratory does not complain of shortness of breath or cough.  Cardiac does not complain of chest pain or increasing edema.  GI is not complaining of abdominal pain nausea vomiting diarrhea or constipation.  GU does not complain of dysuria.  Musculoskeletal does have a history of pelvic fracture apparently pain is controlled on Tylenol but this will have to be watched.  Neurologic does not complain of dizziness headache numbness does have weakness.  Psych is not complaining of being depressed or anxious  Immunization History  Administered Date(s) Administered  . Influenza, High Dose Seasonal PF 05/03/2019  . Moderna SARS-COVID-2 Vaccination 09/26/2019  . Pneumococcal Polysaccharide-23 05/03/2019  . Tdap 01/21/2012   Pertinent  Health Maintenance Due  Topic Date Due  . INFLUENZA VACCINE  02/26/2020  . PNA vac Low Risk Adult (2 of 2 - PCV13) 05/02/2020  . DEXA SCAN  Completed   No flowsheet data found. Functional Status Survey:    Vitals:   11/29/19 1108  BP: (!) 145/79  Pulse: 80  Resp: 18  Temp: (!) 97.1 F (36.2 C)  TempSrc: Oral  SpO2: 94%  Weight: 100 lb 1.6 oz (45.4 kg)  Height: 5\' 2"  (1.575 m)   Body mass index is 18.31 kg/m. Physical Exam   In general this is a pleasant elderly female no distress lying comfortably in bed.  Her skin is warm and dry.  Eyes visual acuity appears to be  intact sclera and conjunctive are clear.  Oropharynx is clear mucous membranes moist.  Chest is clear to auscultation there is no  labored breathing.  Heart is regular rate and rhythm with an occasional irregular beat she does not have significant lower extremity edema.  Abdomen is soft nontender with positive bowel sounds.  Musculoskeletal Limited exam since she is in bed but is able to move legs remedies x4 with significant lower extremity weakness.  Neurologic appears grossly intact her speech is clear.  Psych continues to be quite pleasant and appropriate.    Labs reviewed: Recent Labs    10/11/19 1204 10/11/19 1204 10/23/19 1314 10/24/19 0425 10/27/19 0000  NA 138  --  141 139 138  K 4.4   < > 4.1 3.8 3.8  CL 102   < > 106 108 104  CO2 21   < > 24 22 21   GLUCOSE 91  --  106* 105*  --   BUN 22  --  22 24* 25*  CREATININE 0.87   < > 0.89 0.69 0.6  CALCIUM 9.9   < > 10.0 8.7* 8.5*  MG  --   --   --  2.0  --    < > = values in this interval not displayed.   Recent Labs    08/17/19 1432 10/11/19 1204 10/23/19 1314  AST 20 21 30   ALT 14 14 25   ALKPHOS 119* 88 75  BILITOT 0.3 0.3 0.4  PROT 7.4 7.6 7.9  ALBUMIN 4.6 4.5 4.2   Recent Labs    10/11/19 1204 10/23/19 1314 10/23/19 1314 10/24/19 0425 10/27/19 0000 10/31/19 0000  WBC 8.7 13.5*   < > 9.0 11.8 9.2  NEUTROABS 4.9 10.3*  --   --  9 6  HGB 13.5 13.7   < > 10.3* 10.4* 10.3*  HCT 41.0 43.1   < > 32.6* 31* 32*  MCV 96 98.6  --  97.3  --   --   PLT 366 307   < > 237 226 444*   < > = values in this interval not displayed.   Lab Results  Component Value Date   TSH 4.320 10/11/2019   No results found for: HGBA1C No results found for: CHOL, HDL, LDLCALC, LDLDIRECT, TRIG, CHOLHDL  Significant Diagnostic Results in last 30 days:  XR Pelvis 1-2 Views  Result Date: 11/08/2019 X-rays demonstrate stable right pubic ramus fracture   Assessment/Plan  #1 history of pelvic fracture-she does have recommendations for weightbearing as tolerated-at this point she does have Tylenol for pain-apparently therapy has been discontinued-I have  discussed this with therapy and they will discuss this with her son.  .  At this point continue supportive care.  2.  History of atrial fibrillation appears rate controlled on amiodarone she is not thought to be a good candidate for anticoagulation because of her age and fall risk-.  3.  History of hypertension she is on Norvasc 2.5 mg a day she has some what elevated blood pressure readings with readings often above the high 140s into the 160s-we will increase her Norvasc slightly to 5 mg a day and monitor.  4.  History of coronary artery disease this appears stable and asymptomatic she is on nitro as needed.  5.  History of allergic rhinitis she continues on Allegra she also has Flonase as needed  We will update a CBC and metabolic panel for updated values she does have some history of anemia I suspect this is  chronic recent hemoglobins have been in the mid 10 range   [phone]

## 2019-12-02 ENCOUNTER — Encounter: Payer: Self-pay | Admitting: Internal Medicine

## 2019-12-06 ENCOUNTER — Other Ambulatory Visit: Payer: Self-pay

## 2019-12-06 ENCOUNTER — Ambulatory Visit: Payer: Self-pay

## 2019-12-06 ENCOUNTER — Ambulatory Visit (INDEPENDENT_AMBULATORY_CARE_PROVIDER_SITE_OTHER): Payer: Medicare Other | Admitting: Orthopaedic Surgery

## 2019-12-06 ENCOUNTER — Encounter: Payer: Self-pay | Admitting: Orthopaedic Surgery

## 2019-12-06 DIAGNOSIS — S3282XA Multiple fractures of pelvis without disruption of pelvic ring, initial encounter for closed fracture: Secondary | ICD-10-CM | POA: Diagnosis not present

## 2019-12-06 DIAGNOSIS — W19XXXD Unspecified fall, subsequent encounter: Secondary | ICD-10-CM

## 2019-12-06 DIAGNOSIS — W19XXXA Unspecified fall, initial encounter: Secondary | ICD-10-CM

## 2019-12-06 NOTE — Progress Notes (Signed)
Office Visit Note   Patient: Jordan [name]           Date of Birth: [birth year]           MRN: [id number] Visit Date: 12/06/2019              Requested by: Merlene Laughter, MD [address] [place],  [place] [address] PCP: Merlene Laughter, MD   Assessment & Plan: Visit Diagnoses:  1. Fall, initial encounter   2. Fall, subsequent encounter     Plan: Fractures have demonstrated healing at this point.  Clinically she is has also healed.  At this point we will release her to activity as tolerated.  Follow-up as needed.  Follow-Up Instructions: Return if symptoms worsen or fail to improve.   Orders:  Orders Placed This Encounter  Procedures  . XR Pelvis 1-2 Views   No orders of the defined types were placed in this encounter.     Procedures: No procedures performed   Clinical Data: No additional findings.   Subjective: Chief Complaint  Patient presents with  . Pelvis - Follow-up    Luise is following up for her pelvic fractures.  She does not report any pain.   Review of Systems   Objective: Vital Signs: There were no vitals taken for this visit.  Physical Exam  Ortho Exam Exam of the pelvis shows no tenderness to palpation or compression. Specialty Comments:  No specialty comments available.  Imaging: XR Pelvis 1-2 Views  Result Date: 12/06/2019 Healing fracture of the superior pubis    PMFS History: Patient Active Problem List   Diagnosis Date Noted  . Fracture of pubis (HCC) 11/05/2019  . Bilateral fracture of pubic rami, sequela 11/05/2019  . Fracture of sacrum (HCC) 11/05/2019  . Malnutrition of moderate degree 10/24/2019  . Hip fracture (HCC) 10/23/2019  . Acute midline low back pain without sciatica 06/08/2019  . Pain in left wrist 06/08/2019  . Paroxysmal atrial fibrillation (HCC) 10/04/2018  . Chest pain   . SOB (shortness of breath)   . Coronary artery disease   . Hyperlipidemia   . Hypertension   . Aortic  insufficiency    Past Medical History:  Diagnosis Date  . Acute midline low back pain without sciatica 06/08/2019  . Aortic insufficiency    MILD TO MODERATE  . Chest pain   . Coronary artery disease   . Dizziness   . Hearing loss   . Hyperlipidemia   . Hypertension   . Pain in left wrist 06/08/2019  . Palpitations   . Paroxysmal atrial fibrillation (HCC) 10/04/2018  . PVC's (premature ventricular contractions)   . SOB (shortness of breath)   . Spinal stenosis     Family History  Problem Relation Age of Onset  . Heart disease Mother   . Heart disease Father   . Nephritis Brother     Past Surgical History:  Procedure Laterality Date  . ABDOMINAL HYSTERECTOMY    . APPENDECTOMY    . CARDIAC CATHETERIZATION  01/12/1996   THE EF IS DIFFICULT TO ESTIMATE DUE TO PVCS. SEVERE TWO-VESSEL OBSTRUCTIVE ATHERSCLEROTIC CAD WITH COMPLEX STENOSIS IN THE LAD AND DIAGONAL VESSELS  . CARDIAC CATHETERIZATION  07/29/1991   EF 80-85%  . CORONARY ARTERY BYPASS GRAFT  1997  . INGUINAL HERNIA REPAIR    . OVARIAN CYST REMOVAL    . TONSILLECTOMY AND ADENOIDECTOMY     Social History   Occupational History  . Not on file  Tobacco Use  . Smoking status: Never Smoker  . Smokeless tobacco: Never Used  Substance and Sexual Activity  . Alcohol use: No  . Drug use: No  . Sexual activity: Not on file

## 2019-12-19 ENCOUNTER — Non-Acute Institutional Stay (SKILLED_NURSING_FACILITY): Payer: Medicare Other | Admitting: Internal Medicine

## 2019-12-19 DIAGNOSIS — L309 Dermatitis, unspecified: Secondary | ICD-10-CM | POA: Diagnosis not present

## 2019-12-19 DIAGNOSIS — I480 Paroxysmal atrial fibrillation: Secondary | ICD-10-CM | POA: Diagnosis not present

## 2019-12-19 DIAGNOSIS — I10 Essential (primary) hypertension: Secondary | ICD-10-CM | POA: Diagnosis not present

## 2019-12-19 DIAGNOSIS — B029 Zoster without complications: Secondary | ICD-10-CM

## 2019-12-20 ENCOUNTER — Encounter: Payer: Self-pay | Admitting: Internal Medicine

## 2019-12-20 NOTE — Progress Notes (Signed)
Location:    [facility] Room Number: 106/P Place of Service:  SNF [phone]) Provider:  Argentina Donovan, MD  Patient Care Team: Merlene Laughter, MD as PCP - General (Internal Medicine)  Extended Emergency Contact Information Primary Emergency Contact: Anda Kraft Address: [address]          [place], [place] [address] [place] Phone: [phone] Relation: Grandson Secondary Emergency Contact: Judeth Horn, [place] Home Phone: [phone] Relation: Friend  Code Status:  Full Code Goals of care: Advanced Directive information Advanced Directives 12/20/2019  Does Patient Have a Medical Advance Directive? Yes  Type of Advance Directive (No Data)  Does patient want to make changes to medical advance directive? No - Patient declined  Would patient like information on creating a medical advance directive? -    Chief complaint--- acute visit secondary to rash suspicious for herpes zoster   HPI:  Jordan [name] is a 86 y.o. female seen today for an acute visit for concerns of a rash which could be herpes zoster.  Patient was noted to have a rash apparently  lower left neck area that appeared to have possibly some developing vesicles--there also appeared to be  a vesicle on her left arm.  She is not really complaining of pain but does say it itches.  Vital signs are stable she is afebrile  She is here for rehab after sustaining a recent pelvic fracture this is thought to be healing per review of orthopedic note and she has been cleared for activity as tolerated.  She also has a history of atrial fibrillation in addition to hypertension and coronary artery disease.  We recently increased her Norvasc to 5 mg a day secondary to some elevated systolic readings and this appears to be improved with recent blood pressures 138/65-135/58-appears recent systolic range from 105 into the 130s generally  with baseline being more in the 120-130s area       Past Medical History:  Diagnosis Date   Acute midline low back pain without sciatica 06/08/2019   Aortic insufficiency    MILD TO MODERATE   Chest pain    Coronary artery disease    Dizziness    Hearing loss    Hyperlipidemia    Hypertension    Pain in left wrist 06/08/2019   Palpitations    Paroxysmal atrial fibrillation (HCC) 10/04/2018   PVC's (premature ventricular contractions)    SOB (shortness of breath)    Spinal stenosis    Past Surgical History:  Procedure Laterality Date   ABDOMINAL HYSTERECTOMY     APPENDECTOMY     CARDIAC CATHETERIZATION  01/12/1996   THE EF IS DIFFICULT TO ESTIMATE DUE TO PVCS. SEVERE TWO-VESSEL OBSTRUCTIVE ATHERSCLEROTIC CAD WITH COMPLEX STENOSIS IN THE LAD AND DIAGONAL VESSELS   CARDIAC CATHETERIZATION  07/29/1991   EF 80-85%   CORONARY ARTERY BYPASS GRAFT  1997   INGUINAL HERNIA REPAIR     OVARIAN CYST REMOVAL     TONSILLECTOMY AND ADENOIDECTOMY      Allergies  Allergen Reactions   Cafergot     unk   Cephalexin     Unknown    Clinoril [Sulindac]     Unknown    Codeine     Unknown    Dolobid [Diflunisal]     Unknown    Equagesic     Unknown    Flu  Virus Vaccine     Unknown    Hyzaar [Losartan Potassium-Hctz]     Unknown    Neosporin [Neomycin-Polymyxin-Gramicidin]     Unknown    Other     BEE STING   Pce [Erythromycin Base]     Unknown    Prednisone     Unknown    Tetracyclines & Related     Unknown     Outpatient Encounter Medications as of 12/19/2019  Medication Sig   acetaminophen (TYLENOL) 325 MG tablet Take 650 mg by mouth every 6 (six) hours as needed.   acetaminophen (TYLENOL) 500 MG tablet Take 500 mg by mouth 2 (two) times daily.   amLODipine (NORVASC) 5 MG tablet Take 5 mg by mouth daily.   bisacodyl (DULCOLAX) 10 MG suppository If not relieved by Milk of Magnesia, give one Bisacodyl suppository (10mg ) per rectum X  1 dose in 24 hours PRN. (Do not use constipation standing orders for resident with renal failure [GFR less than 30] Contact MD for orders).   brimonidine (ALPHAGAN) 0.2 % ophthalmic solution Place 1 drop into the right eye in the morning and at bedtime.    Calcium Carbonate-Vit D-Min (CALTRATE 600+D PLUS) 600-400 MG-UNIT per tablet Take 1 tablet by mouth daily.     diclofenac Sodium (VOLTAREN) 1 % GEL Apply 2 g topically daily as needed for pain.   dorzolamide-timolol (COSOPT) 22.3-6.8 MG/ML ophthalmic solution Place 1 drop into both eyes 2 (two) times daily. For Glaucoma   feeding supplement, ENSURE ENLIVE, (ENSURE ENLIVE) LIQD Take 237 mLs by mouth 2 (two) times daily between meals.   fexofenadine (ALLEGRA) 180 MG tablet Take 180 mg by mouth daily.   fluticasone (FLONASE) 50 MCG/ACT nasal spray Place 2 sprays into both nostrils daily as needed for allergies.    Glucosamine-Chondroit-Vit C-Mn (GLUCOSAMINE-CHONDROITIN) CAPS Take 1 capsule by mouth 2 (two) times daily.   LUMIGAN 0.01 % SOLN Place 1 drop into both eyes at bedtime.    magnesium hydroxide (MILK OF MAGNESIA) 400 MG/5ML suspension If no BM in 3 days, give Milk of Magnesia 30cc p.o. X 1 dose in 24 hours PRN (Do not use constipation standing orders for resident with renal failure [GFR less than 30] Contact MD for orders).   Multiple Vitamin (MULTIVITAMIN WITH MINERALS) TABS Take 1 tablet by mouth daily.   Multiple Vitamins-Minerals (PRESERVISION AREDS) CAPS Take 1 capsule by mouth 2 (two) times daily.   nitroGLYCERIN (NITROSTAT) 0.4 MG SL tablet Place 0.4 mg under the tongue every 5 (five) minutes as needed for chest pain.    NON FORMULARY Regular liberalized diet d/t Dx Malnutrition and poor intake.   Omega-3 Fatty Acids (FISH OIL) 1000 MG CAPS Take 1,000 mg by mouth daily. Omegaadvance/1000 mg Omega 3S Triglyceride form   omeprazole (PRILOSEC) 20 MG capsule Take 20 mg by mouth daily.    PACERONE 100 MG tablet Take 1  tablet by mouth once daily   Sodium Phosphates (RA SALINE ENEMA RE) Place rectally. If not relieved by suppository, give Saline Laxative Enema (disposable) X 1 per rectum in 24 hours PRN (Do not use constipation standing orders for resident with renal failure [GFR less than 30] Contact MD for orders).   valACYclovir (VALTREX) 1000 MG tablet Take 1,000 mg by mouth 2 (two) times daily.   vitamin C (ASCORBIC ACID) 500 MG tablet Take 500 mg by mouth daily.    [DISCONTINUED] amLODipine (NORVASC) 5 MG tablet Take 0.5 tablets (2.5 mg total) by mouth daily. (Patient  taking differently: Take 5 mg by mouth daily. )   No facility-administered encounter medications on file as of 12/19/2019.    Review of Systems   General she does not complain of fever or chills.  Skin does have the rash as noted above that itches.  Head ears eyes nose mouth and throat does not complain of visual changes or sore throat.  Respiratory is not complain of being short of breath or having a cough.  Cardiac does not complain of chest pain or edema.  GI is not complaining of abdominal pain nausea vomiting diarrhea constipation.  GU does not complain of dysuria.  Musculoskeletal does have weakness-does not complain of acute pain but still has some soreness.  Neurologic does not complain of dizziness headache does have weakness.  Psych does not complain of being depressed or anxious.    Immunization History  Administered Date(s) Administered   Influenza, High Dose Seasonal PF 05/03/2019   Pneumococcal Polysaccharide-23 05/03/2019   Tdap 01/21/2012   Pertinent  Health Maintenance Due  Topic Date Due   INFLUENZA VACCINE  02/26/2020   PNA vac Low Risk Adult (2 of 2 - PCV13) 05/02/2020   DEXA SCAN  Completed   No flowsheet data found. Functional Status Survey:    Vitals:   12/20/19 0832  BP: 130/65  Pulse: 61  Resp: 18  Temp: (!) 97.4 F (36.3 C)  TempSrc: Oral  SpO2: 94%  Weight: 98 lb 9.6 oz  (44.7 kg)  Height: 5\' 2"  (1.575 m)   Body mass index is 18.03 kg/m. Physical Exam General this is a pleasant elderly female in no distress lying comfortably in bed.  Her skin is warm and dry on the inferior aspect on the left side of her neck there is an erythematous somewhat spotty rash which appears to have developing vesicles-also on her left shoulder there is old small area of erythema with what appears to be another possible vesicle.  There is no drainage bleeding or sign of concerning cellulitis at this point.  Eyes visual acuity appears to be intact sclera and conjunctive are clear.  Oropharynx clear mucous membranes moist.  Chest is clear to auscultation there is no labored breathing.  Heart is regular rate and rhythm minimally bradycardic with a pulse of 58 on auscultation there is no significant lower extremity edema.  Abdomen is soft nontender with positive bowel sounds.  Musculoskeletal Limited exam since she is in bed but is able to move all extremities x4 with lower extremity weakness.  Neurologic appears grossly intact her speech is clear cannot appreciate lateralizing findings.  Psych she is pleasant and appropriate.   Labs reviewed:   Nov 30, 2019.  WBC 9.1 hemoglobin 11.7 platelets 327.  Sodium 141 potassium 4.3 BUN 29 creatinine 0.51.   Recent Labs    10/11/19 1204 10/11/19 1204 10/23/19 1314 10/24/19 0425 10/27/19 0000  NA 138  --  141 139 138  K 4.4   < > 4.1 3.8 3.8  CL 102   < > 106 108 104  CO2 21   < > 24 22 21   GLUCOSE 91  --  106* 105*  --   BUN 22  --  22 24* 25*  CREATININE 0.87   < > 0.89 0.69 0.6  CALCIUM 9.9   < > 10.0 8.7* 8.5*  MG  --   --   --  2.0  --    < > = values in this interval not displayed.  Recent Labs    08/17/19 1432 10/11/19 1204 10/23/19 1314  AST 20 21 30   ALT 14 14 25   ALKPHOS 119* 88 75  BILITOT 0.3 0.3 0.4  PROT 7.4 7.6 7.9  ALBUMIN 4.6 4.5 4.2   Recent Labs    10/11/19 1204 10/23/19 1314  10/23/19 1314 10/24/19 0425 10/27/19 0000 10/31/19 0000  WBC 8.7 13.5*   < > 9.0 11.8 9.2  NEUTROABS 4.9 10.3*  --   --  9 6  HGB 13.5 13.7   < > 10.3* 10.4* 10.3*  HCT 41.0 43.1   < > 32.6* 31* 32*  MCV 96 98.6  --  97.3  --   --   PLT 366 307   < > 237 226 444*   < > = values in this interval not displayed.   Lab Results  Component Value Date   TSH 4.320 10/11/2019   No results found for: HGBA1C No results found for: CHOL, HDL, LDLCALC, LDLDIRECT, TRIG, CHOLHDL  Significant Diagnostic Results in last 30 days:  XR Pelvis 1-2 Views  Result Date: 12/06/2019 Healing fracture of the superior pubis   Assessment/Plan   #1 dermatitis unspecified-there is suspicion for herpes zoster here with what appears to be a vascular type rash as well as itching.  We will start Valtrex 1 g every 12 hours x7 days we have dose this for renal function.  At this point her only complaint is itching but this will have to be monitored-.  And if presentation changes I suspect therapy will need to be changed    Also there will need to be herpes zoster precautions.  2.  Hypertension this appears to be improved since Norvasc was increased as noted above.  Also will update CBC metabolic panel tomorrow for updated values.  3.  Atrial fibrillation this appears rate controlled she is on amiodarone 100 mg daily-she is not on an anticoagulant because of fall risk and advanced age--pulses range from the 50s at times up to the 80s-I do not see consistent elevations in the 80s or consistent pulses in the  50s at this point will monitor clinically she appears to be doing well in this regards   [id number] .

## 2020-01-13 ENCOUNTER — Telehealth: Payer: Self-pay | Admitting: Cardiology

## 2020-01-13 NOTE — Telephone Encounter (Signed)
Jordan [name] is calling requesting a nurse call her to discuss Jordan [name]'s upcoming appointment due to her being in a rehab facility and Jordan [name] no longer being the one that would bring her to the appointment. Please advise.

## 2020-01-13 NOTE — Telephone Encounter (Signed)
Spoke with the patient sister just now and she let me know that her sister has been in a rehab due to falling back in April. She is unable to walk at this time. She said that she just wanted to let us know that they will be bringing her to her appointment on Friday the 25th. She wanted to know if she would be able to come back with her sister. I told her that this would be fine if she met her here for that appointment.    Encouraged sister to call back with any questions or concerns.

## 2020-01-16 ENCOUNTER — Ambulatory Visit: Payer: Medicare Other | Admitting: Cardiology

## 2020-01-20 ENCOUNTER — Ambulatory Visit: Payer: Medicare Other | Admitting: Cardiology

## 2020-02-29 ENCOUNTER — Ambulatory Visit (INDEPENDENT_AMBULATORY_CARE_PROVIDER_SITE_OTHER): Payer: Medicare Other | Admitting: Ophthalmology

## 2020-02-29 ENCOUNTER — Encounter (INDEPENDENT_AMBULATORY_CARE_PROVIDER_SITE_OTHER): Payer: Self-pay | Admitting: Ophthalmology

## 2020-02-29 ENCOUNTER — Other Ambulatory Visit: Payer: Self-pay

## 2020-02-29 DIAGNOSIS — H353122 Nonexudative age-related macular degeneration, left eye, intermediate dry stage: Secondary | ICD-10-CM | POA: Diagnosis not present

## 2020-02-29 DIAGNOSIS — H353134 Nonexudative age-related macular degeneration, bilateral, advanced atrophic with subfoveal involvement: Secondary | ICD-10-CM

## 2020-02-29 DIAGNOSIS — H401131 Primary open-angle glaucoma, bilateral, mild stage: Secondary | ICD-10-CM

## 2020-02-29 DIAGNOSIS — H353222 Exudative age-related macular degeneration, left eye, with inactive choroidal neovascularization: Secondary | ICD-10-CM

## 2020-02-29 NOTE — Assessment & Plan Note (Signed)
Bilateral central scotoma due to combination of RPE, choriocapillaries atrophy and prior disciform scar.  Each are currently inactive.

## 2020-02-29 NOTE — Progress Notes (Signed)
02/29/2020     CHIEF COMPLAINT Patient presents for Retina Follow Up   HISTORY OF PRESENT ILLNESS: Jordan [name] is a [age] y.o. female who presents to the clinic today for:   HPI    Retina Follow Up    Patient presents with  Dry AMD.  In both eyes.  This started 2 years ago.  Severity is moderate.  Duration of 2 years.  Since onset it is stable.          Comments    2 Year AMD F/U OU  Pt c/o blurry VA "sometimes" OU, and states she has to use a magnifying glass to read. She states she is able to watch TV.       Last edited by Ileana RoupKennerly, Paige, COA on 02/29/2020 10:15 AM. (History)      Referring physician: Merlene LaughterStoneking, Hal, MD [address] [place] [id number]  HISTORICAL INFORMATION:   Selected notes from the MEDICAL RECORD NUMBER       CURRENT MEDICATIONS: Current Outpatient Medications (Ophthalmic Drugs)  Medication Sig  . brimonidine (ALPHAGAN) 0.2 % ophthalmic solution Place 1 drop into the right eye in the morning and at bedtime.   . dorzolamide-timolol (COSOPT) 22.3-6.8 MG/ML ophthalmic solution Place 1 drop into both eyes 2 (two) times daily. For Glaucoma  . LUMIGAN 0.01 % SOLN Place 1 drop into both eyes at bedtime.    No current facility-administered medications for this visit. (Ophthalmic Drugs)   Current Outpatient Medications (Other)  Medication Sig  . acetaminophen (TYLENOL) 325 MG tablet Take 650 mg by mouth every 6 (six) hours as needed.  Marland Kitchen. acetaminophen (TYLENOL) 500 MG tablet Take 500 mg by mouth 2 (two) times daily.  Marland Kitchen. amLODipine (NORVASC) 5 MG tablet Take 5 mg by mouth daily.  . bisacodyl (DULCOLAX) 10 MG suppository If not relieved by Milk of Magnesia, give one Bisacodyl suppository (10mg ) per rectum X 1 dose in 24 hours PRN. (Do not use constipation standing orders for resident with renal failure [GFR less than 30] Contact MD for orders).  . Calcium Carbonate-Vit D-Min (CALTRATE 600+D PLUS) 600-400 MG-UNIT per tablet Take 1 tablet  by mouth daily.    . diclofenac Sodium (VOLTAREN) 1 % GEL Apply 2 g topically daily as needed for pain.  . feeding supplement, ENSURE ENLIVE, (ENSURE ENLIVE) LIQD Take 237 mLs by mouth 2 (two) times daily between meals.  . fexofenadine (ALLEGRA) 180 MG tablet Take 180 mg by mouth daily.  . fluticasone (FLONASE) 50 MCG/ACT nasal spray Place 2 sprays into both nostrils daily as needed for allergies.   . Glucosamine-Chondroit-Vit C-Mn (GLUCOSAMINE-CHONDROITIN) CAPS Take 1 capsule by mouth 2 (two) times daily.  . magnesium hydroxide (MILK OF MAGNESIA) 400 MG/5ML suspension If no BM in 3 days, give Milk of Magnesia 30cc p.o. X 1 dose in 24 hours PRN (Do not use constipation standing orders for resident with renal failure [GFR less than 30] Contact MD for orders).  . Multiple Vitamin (MULTIVITAMIN WITH MINERALS) TABS Take 1 tablet by mouth daily.  . Multiple Vitamins-Minerals (PRESERVISION AREDS) CAPS Take 1 capsule by mouth 2 (two) times daily.  . nitroGLYCERIN (NITROSTAT) 0.4 MG SL tablet Place 0.4 mg under the tongue every 5 (five) minutes as needed for chest pain.   . NON FORMULARY Regular liberalized diet d/t Dx Malnutrition and poor intake.  . Omega-3 Fatty Acids (FISH OIL) 1000 MG CAPS Take 1,000 mg by mouth daily. Omegaadvance/1000 mg Omega 3S  Triglyceride form  . omeprazole (PRILOSEC) 20 MG capsule Take 20 mg by mouth daily.   Marland Kitchen PACERONE 100 MG tablet Take 1 tablet by mouth once daily  . Sodium Phosphates (RA SALINE ENEMA RE) Place rectally. If not relieved by suppository, give Saline Laxative Enema (disposable) X 1 per rectum in 24 hours PRN (Do not use constipation standing orders for resident with renal failure [GFR less than 30] Contact MD for orders).  . vitamin C (ASCORBIC ACID) 500 MG tablet Take 500 mg by mouth daily.    No current facility-administered medications for this visit. (Other)      REVIEW OF SYSTEMS:    ALLERGIES Allergies  Allergen Reactions  . Cafergot     unk    . Cephalexin     Unknown   . Clinoril [Sulindac]     Unknown   . Codeine     Unknown   . Dolobid [Diflunisal]     Unknown   . Equagesic     Unknown   . Flu Virus Vaccine     Unknown   . Hyzaar [Losartan Potassium-Hctz]     Unknown   . Neosporin [Neomycin-Polymyxin-Gramicidin]     Unknown   . Other     BEE STING  . Pce [Erythromycin Base]     Unknown   . Prednisone     Unknown   . Tetracyclines & Related     Unknown     PAST MEDICAL HISTORY Past Medical History:  Diagnosis Date  . Acute midline low back pain without sciatica 06/08/2019  . Aortic insufficiency    MILD TO MODERATE  . Chest pain   . Coronary artery disease   . Dizziness   . Hearing loss   . Hyperlipidemia   . Hypertension   . Pain in left wrist 06/08/2019  . Palpitations   . Paroxysmal atrial fibrillation (HCC) 10/04/2018  . PVC's (premature ventricular contractions)   . SOB (shortness of breath)   . Spinal stenosis    Past Surgical History:  Procedure Laterality Date  . ABDOMINAL HYSTERECTOMY    . APPENDECTOMY    . CARDIAC CATHETERIZATION  01/12/1996   THE EF IS DIFFICULT TO ESTIMATE DUE TO PVCS. SEVERE TWO-VESSEL OBSTRUCTIVE ATHERSCLEROTIC CAD WITH COMPLEX STENOSIS IN THE LAD AND DIAGONAL VESSELS  . CARDIAC CATHETERIZATION  07/29/1991   EF 80-85%  . CORONARY ARTERY BYPASS GRAFT  1997  . INGUINAL HERNIA REPAIR    . OVARIAN CYST REMOVAL    . TONSILLECTOMY AND ADENOIDECTOMY      FAMILY HISTORY Family History  Problem Relation Age of Onset  . Heart disease Mother   . Heart disease Father   . Nephritis Brother     SOCIAL HISTORY Social History   Tobacco Use  . Smoking status: Never Smoker  . Smokeless tobacco: Never Used  Substance Use Topics  . Alcohol use: No  . Drug use: No         OPHTHALMIC EXAM:  Base Eye Exam    Visual Acuity (ETDRS)      Right Left   Dist cc 20/200 20/400   Dist ph cc 20/100ecc +1 NI   Correction: Glasses       Tonometry (Tonopen, 10:19 AM)       Right Left   Pressure 07 05       Pupils      Pupils Dark Light Shape React APD   Right PERRL 3 2 Round Slow None   Left PERRL  3 2 Round Slow None       Visual Fields (Counting fingers)      Left Right    Full Full       Extraocular Movement      Right Left    Full Full       Neuro/Psych    Oriented x3: Yes   Mood/Affect: Normal       Dilation    Both eyes: 1.0% Mydriacyl, 2.5% Phenylephrine @ 10:19 AM        Slit Lamp and Fundus Exam    External Exam      Right Left   External Normal Normal       Slit Lamp Exam      Right Left   Lids/Lashes Normal Normal   Conjunctiva/Sclera White and quiet White and quiet   Cornea Clear Clear   Anterior Chamber Deep and quiet Deep and quiet   Iris Round and reactive Round and reactive   Anterior Vitreous Normal Normal       Fundus Exam      Right Left   Posterior Vitreous Posterior vitreous detachment Posterior vitreous detachment   Disc Peripapillary atrophy Peripapillary atrophy   C/D Ratio 0.45 0.7   Macula Geographic atrophy Geographic atrophy, Disciform scar,, no active maculopathy   Vessels Normal Normal   Periphery Normal Normal          IMAGING AND PROCEDURES  Imaging and Procedures for 02/29/20  OCT, Retina - OU - Both Eyes       Right Eye Quality was borderline. Scan locations included subfoveal. Progression has been stable. Findings include abnormal foveal contour, central retinal atrophy, outer retinal atrophy.   Left Eye Quality was borderline. Scan locations included subfoveal. Progression has been stable. Findings include abnormal foveal contour, central retinal atrophy, outer retinal atrophy, inner retinal atrophy.   Notes No active wet macular degeneration with prior disciform scar left eye  Will observe                ASSESSMENT/PLAN:  Exudative age-related macular degeneration of left eye with inactive choroidal neovascularization (HCC) Bilateral central scotoma due to  combination of RPE, choriocapillaries atrophy and prior disciform scar.  Each are currently inactive.      ICD-10-CM   1. Exudative age-related macular degeneration of left eye with inactive choroidal neovascularization (HCC)  H35.3222 OCT, Retina - OU - Both Eyes  2. Advanced nonexudative age-related macular degeneration of both eyes with subfoveal involvement  H35.3134 OCT, Retina - OU - Both Eyes  3. Intermediate stage nonexudative age-related macular degeneration of left eye  H35.3122   4. Primary open angle glaucoma of both eyes, mild stage  H40.1131     1.  Patient with poor hearing today, although she does understand that should new visual acuity decline or profound symptoms develop she may return at any time  2.  3.  Ophthalmic Meds Ordered this visit:  No orders of the defined types were placed in this encounter.      Return in about 1 year (around 02/28/2021) for COLOR FP, OCT, DILATE OU.  There are no Patient Instructions on file for this visit.   Explained the diagnoses, plan, and follow up with the patient and they expressed understanding.  Patient expressed understanding of the importance of proper follow up care.   Alford Highland Novie Maggio M.D. Diseases & Surgery of the Retina and Vitreous [facility] 02/29/20     Abbreviations: M myopia (nearsighted);  A astigmatism; H hyperopia (farsighted); P presbyopia; Mrx spectacle prescription;  CTL contact lenses; OD right eye; OS left eye; OU both eyes  XT exotropia; ET esotropia; PEK punctate epithelial keratitis; PEE punctate epithelial erosions; DES dry eye syndrome; MGD meibomian gland dysfunction; ATs artificial tears; PFAT's preservative free artificial tears; NSC nuclear sclerotic cataract; PSC posterior subcapsular cataract; ERM epi-retinal membrane; PVD posterior vitreous detachment; RD retinal detachment; DM diabetes mellitus; DR diabetic retinopathy; NPDR non-proliferative diabetic retinopathy; PDR  proliferative diabetic retinopathy; CSME clinically significant macular edema; DME diabetic macular edema; dbh dot blot hemorrhages; CWS cotton wool spot; POAG primary open angle glaucoma; C/D cup-to-disc ratio; HVF humphrey visual field; GVF goldmann visual field; OCT optical coherence tomography; IOP intraocular pressure; BRVO Branch retinal vein occlusion; CRVO central retinal vein occlusion; CRAO central retinal artery occlusion; BRAO branch retinal artery occlusion; RT retinal tear; SB scleral buckle; PPV pars plana vitrectomy; VH Vitreous hemorrhage; PRP panretinal laser photocoagulation; IVK intravitreal kenalog; VMT vitreomacular traction; MH Macular hole;  NVD neovascularization of the disc; NVE neovascularization elsewhere; AREDS age related eye disease study; ARMD age related macular degeneration; POAG primary open angle glaucoma; EBMD epithelial/anterior basement membrane dystrophy; ACIOL anterior chamber intraocular lens; IOL intraocular lens; PCIOL posterior chamber intraocular lens; Phaco/IOL phacoemulsification with intraocular lens placement; PRK photorefractive keratectomy; LASIK laser assisted in situ keratomileusis; HTN hypertension; DM diabetes mellitus; COPD chronic obstructive pulmonary disease

## 2021-02-28 ENCOUNTER — Encounter (INDEPENDENT_AMBULATORY_CARE_PROVIDER_SITE_OTHER): Payer: Medicare Other | Admitting: Ophthalmology

## 2021-08-28 DEATH — deceased
